# Patient Record
Sex: Female | Born: 1990 | Race: Black or African American | Hispanic: No | Smoking: Current every day smoker
Health system: Southern US, Community
[De-identification: ages and names within clinical notes are randomized; demographics above are authoritative.]

## PROBLEM LIST (undated history)

## (undated) ENCOUNTER — Inpatient Hospital Stay (HOSPITAL_COMMUNITY): Payer: Medicaid Other

## (undated) ENCOUNTER — Inpatient Hospital Stay (HOSPITAL_COMMUNITY): Payer: Self-pay

## (undated) DIAGNOSIS — A599 Trichomoniasis, unspecified: Secondary | ICD-10-CM

## (undated) DIAGNOSIS — B379 Candidiasis, unspecified: Secondary | ICD-10-CM

## (undated) DIAGNOSIS — A499 Bacterial infection, unspecified: Secondary | ICD-10-CM

## (undated) DIAGNOSIS — K589 Irritable bowel syndrome without diarrhea: Secondary | ICD-10-CM

## (undated) DIAGNOSIS — Z8619 Personal history of other infectious and parasitic diseases: Secondary | ICD-10-CM

## (undated) DIAGNOSIS — K5090 Crohn's disease, unspecified, without complications: Secondary | ICD-10-CM

## (undated) DIAGNOSIS — I959 Hypotension, unspecified: Secondary | ICD-10-CM

## (undated) DIAGNOSIS — E611 Iron deficiency: Secondary | ICD-10-CM

## (undated) HISTORY — DX: Bacterial infection, unspecified: A49.9

## (undated) HISTORY — DX: Candidiasis, unspecified: B37.9

## (undated) HISTORY — DX: Trichomoniasis, unspecified: A59.9

## (undated) HISTORY — DX: Iron deficiency: E61.1

## (undated) HISTORY — DX: Personal history of other infectious and parasitic diseases: Z86.19

## (undated) HISTORY — DX: Crohn's disease, unspecified, without complications: K50.90

---
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
## 2008-07-27 ENCOUNTER — Emergency Department (HOSPITAL_COMMUNITY): Admission: EM | Admit: 2008-07-27 | Discharge: 2008-07-27 | Payer: Self-pay | Admitting: Emergency Medicine

## 2008-09-25 ENCOUNTER — Emergency Department (HOSPITAL_COMMUNITY): Admission: EM | Admit: 2008-09-25 | Discharge: 2008-09-25 | Payer: Self-pay | Admitting: Emergency Medicine

## 2008-10-21 ENCOUNTER — Emergency Department (HOSPITAL_COMMUNITY): Admission: EM | Admit: 2008-10-21 | Discharge: 2008-10-22 | Payer: Self-pay | Admitting: Emergency Medicine

## 2008-11-26 ENCOUNTER — Inpatient Hospital Stay (HOSPITAL_COMMUNITY): Admission: AD | Admit: 2008-11-26 | Discharge: 2008-11-26 | Payer: Self-pay | Admitting: Obstetrics & Gynecology

## 2008-12-04 ENCOUNTER — Inpatient Hospital Stay (HOSPITAL_COMMUNITY): Admission: AD | Admit: 2008-12-04 | Discharge: 2008-12-04 | Payer: Self-pay | Admitting: Obstetrics

## 2009-01-24 ENCOUNTER — Ambulatory Visit (HOSPITAL_COMMUNITY): Admission: RE | Admit: 2009-01-24 | Discharge: 2009-01-24 | Payer: Self-pay | Admitting: Obstetrics

## 2009-05-05 ENCOUNTER — Inpatient Hospital Stay (HOSPITAL_COMMUNITY): Admission: AD | Admit: 2009-05-05 | Discharge: 2009-05-05 | Payer: Self-pay | Admitting: Obstetrics

## 2009-05-29 ENCOUNTER — Ambulatory Visit (HOSPITAL_COMMUNITY): Admission: RE | Admit: 2009-05-29 | Discharge: 2009-05-29 | Payer: Self-pay | Admitting: Obstetrics

## 2009-06-05 ENCOUNTER — Inpatient Hospital Stay (HOSPITAL_COMMUNITY): Admission: AD | Admit: 2009-06-05 | Discharge: 2009-06-05 | Payer: Self-pay | Admitting: Obstetrics

## 2009-06-08 ENCOUNTER — Inpatient Hospital Stay (HOSPITAL_COMMUNITY): Admission: AD | Admit: 2009-06-08 | Discharge: 2009-06-12 | Payer: Self-pay | Admitting: Obstetrics

## 2009-12-31 ENCOUNTER — Ambulatory Visit: Payer: Self-pay | Admitting: Nurse Practitioner

## 2009-12-31 ENCOUNTER — Inpatient Hospital Stay (HOSPITAL_COMMUNITY): Admission: AD | Admit: 2009-12-31 | Discharge: 2009-12-31 | Payer: Self-pay | Admitting: Obstetrics

## 2010-02-18 ENCOUNTER — Ambulatory Visit: Payer: Self-pay | Admitting: Nurse Practitioner

## 2010-02-18 ENCOUNTER — Inpatient Hospital Stay (HOSPITAL_COMMUNITY): Admission: AD | Admit: 2010-02-18 | Discharge: 2010-02-18 | Payer: Self-pay | Admitting: Obstetrics

## 2010-04-22 ENCOUNTER — Inpatient Hospital Stay (HOSPITAL_COMMUNITY): Admission: AD | Admit: 2010-04-22 | Discharge: 2010-04-22 | Payer: Self-pay | Admitting: Obstetrics

## 2010-04-22 ENCOUNTER — Ambulatory Visit: Payer: Self-pay | Admitting: Nurse Practitioner

## 2010-07-16 ENCOUNTER — Emergency Department (HOSPITAL_BASED_OUTPATIENT_CLINIC_OR_DEPARTMENT_OTHER)
Admission: EM | Admit: 2010-07-16 | Discharge: 2010-07-16 | Disposition: A | Discharge status: Home / Self Care | Payer: Medicaid Other | Attending: Emergency Medicine | Admitting: Emergency Medicine

## 2010-07-16 ENCOUNTER — Emergency Department (HOSPITAL_COMMUNITY)
Admission: EM | Admit: 2010-07-16 | Discharge: 2010-07-16 | Disposition: A | Discharge status: Home / Self Care | Payer: Medicaid Other | Attending: Emergency Medicine | Admitting: Emergency Medicine

## 2010-07-16 DIAGNOSIS — R109 Unspecified abdominal pain: Secondary | ICD-10-CM | POA: Insufficient documentation

## 2010-07-16 DIAGNOSIS — N390 Urinary tract infection, site not specified: Secondary | ICD-10-CM | POA: Insufficient documentation

## 2010-07-16 LAB — URINALYSIS, ROUTINE W REFLEX MICROSCOPIC
Bilirubin Urine: NEGATIVE
Bilirubin Urine: NEGATIVE
Ketones, ur: NEGATIVE mg/dL
Nitrite: NEGATIVE
Protein, ur: 30 mg/dL — AB
Specific Gravity, Urine: 1.016 (ref 1.005–1.030)
Specific Gravity, Urine: 1.016 (ref 1.005–1.030)
Urine Glucose, Fasting: NEGATIVE mg/dL
Urobilinogen, UA: 0.2 mg/dL (ref 0.0–1.0)
Urobilinogen, UA: 0.2 mg/dL (ref 0.0–1.0)

## 2010-07-16 LAB — PREGNANCY, URINE: Preg Test, Ur: NEGATIVE

## 2010-07-16 LAB — URINE MICROSCOPIC-ADD ON

## 2010-08-21 LAB — URINALYSIS, ROUTINE W REFLEX MICROSCOPIC
Glucose, UA: NEGATIVE mg/dL
Hgb urine dipstick: NEGATIVE
Specific Gravity, Urine: 1.01 (ref 1.005–1.030)
pH: 6 (ref 5.0–8.0)

## 2010-08-21 LAB — POCT PREGNANCY, URINE: Preg Test, Ur: NEGATIVE

## 2010-08-23 LAB — POCT PREGNANCY, URINE: Preg Test, Ur: NEGATIVE

## 2010-08-25 LAB — COMPREHENSIVE METABOLIC PANEL
Albumin: 3.9 g/dL (ref 3.5–5.2)
Alkaline Phosphatase: 110 U/L (ref 39–117)
BUN: 6 mg/dL (ref 6–23)
Calcium: 9.8 mg/dL (ref 8.4–10.5)
Glucose, Bld: 96 mg/dL (ref 70–99)
Potassium: 3.9 mEq/L (ref 3.5–5.1)
Sodium: 135 mEq/L (ref 135–145)
Total Protein: 8.2 g/dL (ref 6.0–8.3)

## 2010-08-25 LAB — CBC
MCHC: 32.8 g/dL (ref 30.0–36.0)
MCV: 80.7 fL (ref 78.0–100.0)
Platelets: 351 10*3/uL (ref 150–400)
RDW: 18 % — ABNORMAL HIGH (ref 11.5–15.5)
WBC: 7.5 10*3/uL (ref 4.0–10.5)

## 2010-08-25 LAB — POCT PREGNANCY, URINE: Preg Test, Ur: NEGATIVE

## 2010-08-25 LAB — URINALYSIS, ROUTINE W REFLEX MICROSCOPIC
Nitrite: NEGATIVE
Protein, ur: NEGATIVE mg/dL
Specific Gravity, Urine: <1.005 — ABNORMAL LOW (ref 1.005–1.030)
Urobilinogen, UA: 0.2 mg/dL (ref 0.0–1.0)

## 2010-08-26 LAB — CBC
HCT: 24.7 % — ABNORMAL LOW (ref 36.0–46.0)
MCHC: 32.9 g/dL (ref 30.0–36.0)
MCV: 85.1 fL (ref 78.0–100.0)
RBC: 2.9 MIL/uL — ABNORMAL LOW (ref 3.87–5.11)
WBC: 11.4 10*3/uL — ABNORMAL HIGH (ref 4.0–10.5)

## 2010-09-10 DIAGNOSIS — A599 Trichomoniasis, unspecified: Secondary | ICD-10-CM

## 2010-09-10 HISTORY — DX: Trichomoniasis, unspecified: A59.9

## 2010-09-10 LAB — COMPREHENSIVE METABOLIC PANEL
ALT: 20 U/L (ref 0–35)
BUN: 4 mg/dL — ABNORMAL LOW (ref 6–23)
CO2: 20 mEq/L (ref 19–32)
Calcium: 8.9 mg/dL (ref 8.4–10.5)
Creatinine, Ser: 0.87 mg/dL (ref 0.4–1.2)
GFR calc non Af Amer: >60 mL/min (ref >60)
Glucose, Bld: 91 mg/dL (ref 70–99)
Sodium: 132 mEq/L — ABNORMAL LOW (ref 135–145)
Total Protein: 6.5 g/dL (ref 6.0–8.3)

## 2010-09-10 LAB — CBC
HCT: 31.2 % — ABNORMAL LOW (ref 36.0–46.0)
MCHC: 33.5 g/dL (ref 30.0–36.0)
MCV: 83.7 fL (ref 78.0–100.0)
Platelets: 299 10*3/uL (ref 150–400)
RDW: 14.9 % (ref 11.5–15.5)
WBC: 8.9 10*3/uL (ref 4.0–10.5)

## 2010-09-10 LAB — URIC ACID: Uric Acid, Serum: 6.5 mg/dL (ref 2.4–7.0)

## 2010-09-10 LAB — LACTATE DEHYDROGENASE: LDH: 99 U/L (ref 94–250)

## 2010-09-10 LAB — RPR: RPR Ser Ql: NONREACTIVE

## 2010-09-12 LAB — URINALYSIS, ROUTINE W REFLEX MICROSCOPIC
Hgb urine dipstick: NEGATIVE
Nitrite: NEGATIVE
Protein, ur: NEGATIVE mg/dL
Specific Gravity, Urine: 1.02 (ref 1.005–1.030)
Urobilinogen, UA: 0.2 mg/dL (ref 0.0–1.0)

## 2010-09-12 LAB — CBC
MCHC: 33.7 g/dL (ref 30.0–36.0)
MCV: 86.2 fL (ref 78.0–100.0)
Platelets: 295 10*3/uL (ref 150–400)
WBC: 7.6 10*3/uL (ref 4.0–10.5)

## 2010-09-17 LAB — URINALYSIS, ROUTINE W REFLEX MICROSCOPIC
Bilirubin Urine: NEGATIVE
Hgb urine dipstick: NEGATIVE
Nitrite: NEGATIVE
Protein, ur: NEGATIVE mg/dL
Specific Gravity, Urine: 1.025 (ref 1.005–1.030)
Urobilinogen, UA: 0.2 mg/dL (ref 0.0–1.0)

## 2010-09-17 LAB — WET PREP, GENITAL

## 2010-09-18 LAB — POCT I-STAT, CHEM 8
Calcium, Ion: 1.16 mmol/L (ref 1.12–1.32)
Chloride: 107 mEq/L (ref 96–112)
Glucose, Bld: 83 mg/dL (ref 70–99)
HCT: 35 % — ABNORMAL LOW (ref 36.0–46.0)
TCO2: 20 mmol/L (ref 0–100)

## 2010-09-18 LAB — URINALYSIS, ROUTINE W REFLEX MICROSCOPIC
Ketones, ur: NEGATIVE mg/dL
Nitrite: NEGATIVE
Protein, ur: NEGATIVE mg/dL
Urobilinogen, UA: 0.2 mg/dL (ref 0.0–1.0)

## 2010-09-18 LAB — DIFFERENTIAL
Basophils Absolute: 0 10*3/uL (ref 0.0–0.1)
Basophils Relative: 0 % (ref 0–1)
Eosinophils Absolute: 0.1 10*3/uL (ref 0.0–0.7)
Eosinophils Relative: 2 % (ref 0–5)
Lymphocytes Relative: 33 % (ref 12–46)
Monocytes Absolute: 0.4 10*3/uL (ref 0.1–1.0)

## 2010-09-18 LAB — ABO/RH: ABO/RH(D): O POS

## 2010-09-18 LAB — CBC
HCT: 35.5 % — ABNORMAL LOW (ref 36.0–46.0)
Hemoglobin: 11.9 g/dL — ABNORMAL LOW (ref 12.0–15.0)
MCHC: 33.5 g/dL (ref 30.0–36.0)
MCV: 86.9 fL (ref 78.0–100.0)
RDW: 15.3 % (ref 11.5–15.5)

## 2010-09-18 LAB — WET PREP, GENITAL
Trich, Wet Prep: NONE SEEN
WBC, Wet Prep HPF POC: NONE SEEN
Yeast Wet Prep HPF POC: NONE SEEN
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
## 2011-03-07 ENCOUNTER — Encounter (HOSPITAL_COMMUNITY): Payer: Self-pay

## 2011-03-07 ENCOUNTER — Inpatient Hospital Stay (HOSPITAL_COMMUNITY)
Admission: AD | Admit: 2011-03-07 | Discharge: 2011-03-07 | Disposition: A | Discharge status: Home / Self Care | Payer: Medicaid Other | Source: Ambulatory Visit | Attending: Obstetrics and Gynecology | Admitting: Obstetrics and Gynecology

## 2011-03-07 DIAGNOSIS — K589 Irritable bowel syndrome without diarrhea: Secondary | ICD-10-CM

## 2011-03-07 DIAGNOSIS — R197 Diarrhea, unspecified: Secondary | ICD-10-CM

## 2011-03-07 DIAGNOSIS — N390 Urinary tract infection, site not specified: Secondary | ICD-10-CM | POA: Insufficient documentation

## 2011-03-07 HISTORY — DX: Irritable bowel syndrome, unspecified: K58.9

## 2011-03-07 LAB — COMPREHENSIVE METABOLIC PANEL
Albumin: 3.3 g/dL — ABNORMAL LOW (ref 3.5–5.2)
Alkaline Phosphatase: 119 U/L — ABNORMAL HIGH (ref 39–117)
BUN: 5 mg/dL — ABNORMAL LOW (ref 6–23)
Creatinine, Ser: 0.74 mg/dL (ref 0.50–1.10)
Potassium: 3.4 mEq/L — ABNORMAL LOW (ref 3.5–5.1)
Total Protein: 7.5 g/dL (ref 6.0–8.3)

## 2011-03-07 LAB — CBC
HCT: 30.9 % — ABNORMAL LOW (ref 36.0–46.0)
MCHC: 32 g/dL (ref 30.0–36.0)
RDW: 15.5 % (ref 11.5–15.5)

## 2011-03-07 MED ORDER — ONDANSETRON HCL 4 MG/2ML IJ SOLN
4.0000 mg | Freq: Once | INTRAMUSCULAR | Status: AC
  Administered 2011-03-07: 4 mg via INTRAVENOUS
  Filled 2011-03-07: qty 2

## 2011-03-07 MED ORDER — SODIUM CHLORIDE 0.9 % IV SOLN
Freq: Once | INTRAVENOUS | Status: AC
  Administered 2011-03-07: 18:00:00 via INTRAVENOUS

## 2011-03-07 MED ORDER — CIPROFLOXACIN HCL 500 MG PO TABS: 500.0000 mg | ORAL_TABLET | Freq: Two times a day (BID) | ORAL | Status: AC

## 2011-03-07 MED ORDER — ESOMEPRAZOLE MAGNESIUM 20 MG PO CPDR: 20.0000 mg | DELAYED_RELEASE_CAPSULE | Freq: Every day | ORAL | Status: DC

## 2011-03-07 NOTE — ED Provider Notes (Signed)
History     CSN: [id number] Arrival date & time: 03/07/2011  4:48 PM  No chief complaint on file.  HPI Sharon Clayton is a 20 y.o. female who presents to [facility]U for diarrhea, nausea and feeling weak. States she was diagnosed with IBS years ago and always took nexium. Has not had a normal BM since son was born 2 years ago. Has not been back to GI so is no longer on medication. Last night was different from usual with nausea and more episodes of diarrhea and feeling dehydrated and palpations. Today no palpations but feeling tired.   Past Medical History  Diagnosis Date  . IBS (irritable bowel syndrome)     Past Surgical History  Procedure Date  . Cesarean section     No family history on file.  History  Substance Use Topics  . Smoking status: Not on file  . Smokeless tobacco: Not on file  . Alcohol Use:     OB History    Grav Para Term Preterm Abortions TAB SAB Ect Mult Living   1 1 1  0 0 0 0 0 0 1      Review of Systems  Eyes: Negative.   Respiratory: Negative.   Cardiovascular: Positive for palpitations.  Gastrointestinal: Positive for nausea, abdominal pain and diarrhea.  Musculoskeletal: Negative.   Skin: Negative.     Allergies  Review of patient's allergies indicates no known allergies.  Home Medications  No current outpatient prescriptions on file.  BP 142/85  Pulse 82  Temp(Src) 99 F (37.2 C) (Oral)  Resp 20  Ht 5' 1"  (1.549 m)  Wt 184 lb 3.2 oz (83.553 kg)  BMI 34.80 kg/m2  SpO2 100%  LMP 03/05/2011  Physical Exam  Nursing note and vitals reviewed. Constitutional: She is oriented to person, place, and time. No distress.       obese  HENT:  Head: Normocephalic.  Eyes: EOM are normal.  Neck: Neck supple.  Cardiovascular: Normal rate and regular rhythm.   Pulmonary/Chest: Effort normal and breath sounds normal.  Abdominal: Soft. Bowel sounds are normal. There is tenderness in the left lower quadrant.       Mildly tender lower left abdomen.    Musculoskeletal: Normal range of motion.  Neurological: She is alert and oriented to person, place, and time. No cranial nerve deficit.  Skin: Skin is warm and dry.    ED Course: IV hydration and antimetics.   Procedures  MDM: Patient feeling much better after IV hydration and antimeditics  And states she is ready to go home.     Assessment: Diarrhea   UTI  Plan:  Patient to follow up with GI for her IBS.   B.r.a.t. Diet   Cipro 500 mg. Po bid x 5 days   [facility], NP 03/07/11 [address] [place], [place] 03/07/11 [phone]

## 2011-03-07 NOTE — Progress Notes (Signed)
Pt states she has had diarrhea every day for about one year, was diagnosed with IBS. Last night she states she had multiple episodes of diarrhea and felt dizzy and felt heart racing and palpitations and nausea. Continues today to feel nausea and periods of dizziness. No pain.

## 2011-03-08 NOTE — ED Provider Notes (Signed)
Agree with above note.  Sharon Clayton 03/08/2011 7:48 AM

## 2011-03-28 ENCOUNTER — Emergency Department (HOSPITAL_COMMUNITY)
Admission: EM | Admit: 2011-03-28 | Discharge: 2011-03-28 | Disposition: A | Discharge status: Home / Self Care | Payer: Medicaid Other | Attending: Emergency Medicine | Admitting: Emergency Medicine

## 2011-03-28 DIAGNOSIS — R42 Dizziness and giddiness: Secondary | ICD-10-CM | POA: Insufficient documentation

## 2011-03-28 DIAGNOSIS — R197 Diarrhea, unspecified: Secondary | ICD-10-CM | POA: Insufficient documentation

## 2011-03-28 LAB — POCT I-STAT, CHEM 8
BUN: 9 mg/dL (ref 6–23)
Calcium, Ion: 1.26 mmol/L (ref 1.12–1.32)
Chloride: 106 meq/L (ref 96–112)
Creatinine, Ser: 0.9 mg/dL (ref 0.50–1.10)
Glucose, Bld: 76 mg/dL (ref 70–99)
HCT: 37 % (ref 36.0–46.0)
Hemoglobin: 12.6 g/dL (ref 12.0–15.0)
Potassium: 3.7 mEq/L (ref 3.5–5.1)
Sodium: 141 mEq/L (ref 135–145)
TCO2: 23 mmol/L (ref 0–100)

## 2011-03-28 LAB — URINALYSIS, ROUTINE W REFLEX MICROSCOPIC
Glucose, UA: NEGATIVE mg/dL
Leukocytes, UA: NEGATIVE
Specific Gravity, Urine: 1.014 (ref 1.005–1.030)
pH: 7 (ref 5.0–8.0)

## 2011-03-28 LAB — POCT PREGNANCY, URINE: Preg Test, Ur: NEGATIVE

## 2011-09-10 ENCOUNTER — Encounter: Payer: Self-pay | Admitting: Internal Medicine

## 2011-09-10 DIAGNOSIS — Z8619 Personal history of other infectious and parasitic diseases: Secondary | ICD-10-CM

## 2011-09-10 DIAGNOSIS — A499 Bacterial infection, unspecified: Secondary | ICD-10-CM

## 2011-09-10 DIAGNOSIS — B379 Candidiasis, unspecified: Secondary | ICD-10-CM

## 2011-09-10 DIAGNOSIS — E611 Iron deficiency: Secondary | ICD-10-CM

## 2011-09-10 HISTORY — DX: Candidiasis, unspecified: B37.9

## 2011-09-10 HISTORY — DX: Personal history of other infectious and parasitic diseases: Z86.19

## 2011-09-10 HISTORY — DX: Iron deficiency: E61.1

## 2011-09-10 HISTORY — DX: Bacterial infection, unspecified: A49.9

## 2011-09-11 DIAGNOSIS — N920 Excessive and frequent menstruation with regular cycle: Secondary | ICD-10-CM | POA: Insufficient documentation

## 2011-09-12 ENCOUNTER — Encounter (INDEPENDENT_AMBULATORY_CARE_PROVIDER_SITE_OTHER): Payer: Medicaid Other | Admitting: Obstetrics and Gynecology

## 2011-09-12 DIAGNOSIS — N926 Irregular menstruation, unspecified: Secondary | ICD-10-CM

## 2011-09-12 DIAGNOSIS — Z0000 Encounter for general adult medical examination without abnormal findings: Secondary | ICD-10-CM

## 2011-09-12 DIAGNOSIS — Z202 Contact with and (suspected) exposure to infections with a predominantly sexual mode of transmission: Secondary | ICD-10-CM

## 2011-09-20 ENCOUNTER — Ambulatory Visit: Payer: Medicaid Other | Admitting: Internal Medicine

## 2011-09-27 ENCOUNTER — Telehealth: Payer: Self-pay

## 2011-09-27 MED ORDER — METRONIDAZOLE 0.75 % VA GEL: 1.0000 | Freq: Every day | VAGINAL | Status: DC

## 2011-09-27 NOTE — Telephone Encounter (Signed)
Spoke with pt rgd labs informed labs and that pap showed bv needs treatment pt wants rx called to [facility] advised pt rx called in pt voice understanding.

## 2011-09-30 ENCOUNTER — Other Ambulatory Visit: Payer: Self-pay

## 2011-09-30 DIAGNOSIS — N926 Irregular menstruation, unspecified: Secondary | ICD-10-CM

## 2011-10-07 DIAGNOSIS — N920 Excessive and frequent menstruation with regular cycle: Secondary | ICD-10-CM

## 2011-10-08 ENCOUNTER — Encounter: Payer: Medicaid Other | Admitting: Obstetrics and Gynecology

## 2011-10-08 ENCOUNTER — Other Ambulatory Visit: Payer: Medicaid Other

## 2011-11-13 ENCOUNTER — Telehealth: Payer: Self-pay | Admitting: Obstetrics and Gynecology

## 2011-11-13 NOTE — Telephone Encounter (Signed)
ND PT

## 2011-11-13 NOTE — Telephone Encounter (Signed)
Triage/cht received

## 2011-11-15 NOTE — Telephone Encounter (Signed)
Spoke with pt rgd msg pt wants to r/s u/s and f/u from 4/30 pt has appt 12/13/11 at 1030 for u/s the f/u with ND pt voice understanding

## 2011-12-13 ENCOUNTER — Ambulatory Visit (INDEPENDENT_AMBULATORY_CARE_PROVIDER_SITE_OTHER): Payer: Medicaid Other | Admitting: Obstetrics and Gynecology

## 2011-12-13 ENCOUNTER — Ambulatory Visit (INDEPENDENT_AMBULATORY_CARE_PROVIDER_SITE_OTHER): Payer: Medicaid Other

## 2011-12-13 ENCOUNTER — Encounter: Payer: Self-pay | Admitting: Obstetrics and Gynecology

## 2011-12-13 VITALS — BP 108/60 | Temp 98.1°F | Resp 16 | Ht 61.5 in | Wt 161.0 lb

## 2011-12-13 DIAGNOSIS — N926 Irregular menstruation, unspecified: Secondary | ICD-10-CM

## 2011-12-13 DIAGNOSIS — N920 Excessive and frequent menstruation with regular cycle: Secondary | ICD-10-CM

## 2011-12-13 MED ORDER — IBUPROFEN 600 MG PO TABS: 600.0000 mg | ORAL_TABLET | Freq: Four times a day (QID) | ORAL | Status: AC | PRN

## 2011-12-13 NOTE — Progress Notes (Signed)
F/U U/S for menorrhagia Desires blood work for anemia Pt states her periods are still long.  She has occasional cramping BP 108/60  Temp 98.1 F (36.7 C)  Resp 16  Ht 5' 1.5" (1.562 m)  Wt 161 lb (73.029 kg)  BMI 29.93 kg/m2  LMP 12/08/2011 Physical Examination: General appearance - alert, well appearing, and in no distress Heart - normal rate, regular rhythm, normal S1, S2, no murmurs, rubs, clicks or gallops Abdomen - soft, nontender, nondistended, no masses or organomegaly Extremities - peripheral pulses normal, no pedal edema, no clubbing or cyanosis [place] 6.56 by 4.22 cm normal appearing ovaries Results for orders placed during the hospital encounter of 03/28/11  POCT I-STAT, CHEM 8      Component Value Range   Sodium 141  135 - 145 mEq/L   Potassium 3.7  3.5 - 5.1 mEq/L   Chloride 106  96 - 112 mEq/L   BUN 9  6 - 23 mg/dL   Creatinine, Ser 0.90  0.50 - 1.10 mg/dL   Glucose, Bld 76  70 - 99 mg/dL   Calcium, Ion 1.26  1.12 - 1.32 mmol/L   TCO2 23  0 - 100 mmol/L   Hemoglobin 12.6  12.0 - 15.0 g/dL   HCT 37.0  36.0 - 46.0 %  POCT PREGNANCY, URINE      Component Value Range   Preg Test, Ur NEGATIVE    URINALYSIS, ROUTINE W REFLEX MICROSCOPIC      Component Value Range   Color, Urine YELLOW  YELLOW   APPearance CLOUDY (*) CLEAR   Specific Gravity, Urine 1.014  1.005 - 1.030   pH 7.0  5.0 - 8.0   Glucose, UA NEGATIVE  NEGATIVE mg/dL   Hgb urine dipstick NEGATIVE  NEGATIVE   Bilirubin Urine NEGATIVE  NEGATIVE   Ketones, ur NEGATIVE  NEGATIVE mg/dL   Protein, ur NEGATIVE  NEGATIVE mg/dL   Urobilinogen, UA 0.2  0.0 - 1.0 mg/dL   Nitrite NEGATIVE  NEGATIVE   Leukocytes, UA NEGATIVE  NEGATIVE    HGB 11.7 TSH WNL Menorrhagia US WNL All treatments reviewed with the pt.  She desires Motrin.  F/u 3-4 months for AEX

## 2011-12-19 ENCOUNTER — Other Ambulatory Visit: Payer: Self-pay | Admitting: Obstetrics and Gynecology

## 2011-12-19 DIAGNOSIS — N926 Irregular menstruation, unspecified: Secondary | ICD-10-CM

## 2011-12-20 ENCOUNTER — Telehealth: Payer: Self-pay | Admitting: Internal Medicine

## 2011-12-20 NOTE — Telephone Encounter (Signed)
Received records from Dr. Lorie Apley office. Dr. Olevia Perches reviewed the records and declined to accept pt at this time. Notified pt and she said she would pickup her medical records.

## 2012-06-17 NOTE — Telephone Encounter (Signed)
Patient never came to pick up records from Dr Collene Mares. Records have been destroyed as of today.

## 2012-08-21 ENCOUNTER — Encounter (HOSPITAL_COMMUNITY): Payer: Self-pay | Admitting: Emergency Medicine

## 2012-08-21 ENCOUNTER — Emergency Department (HOSPITAL_COMMUNITY)
Admission: EM | Admit: 2012-08-21 | Discharge: 2012-08-21 | Disposition: A | Discharge status: Home / Self Care | Payer: Self-pay | Attending: Emergency Medicine | Admitting: Emergency Medicine

## 2012-08-21 DIAGNOSIS — Z79899 Other long term (current) drug therapy: Secondary | ICD-10-CM | POA: Insufficient documentation

## 2012-08-21 DIAGNOSIS — Z8719 Personal history of other diseases of the digestive system: Secondary | ICD-10-CM | POA: Insufficient documentation

## 2012-08-21 DIAGNOSIS — Z8619 Personal history of other infectious and parasitic diseases: Secondary | ICD-10-CM | POA: Insufficient documentation

## 2012-08-21 DIAGNOSIS — IMO0002 Reserved for concepts with insufficient information to code with codable children: Secondary | ICD-10-CM | POA: Insufficient documentation

## 2012-08-21 DIAGNOSIS — F17200 Nicotine dependence, unspecified, uncomplicated: Secondary | ICD-10-CM | POA: Insufficient documentation

## 2012-08-21 MED ORDER — HYDROCODONE-ACETAMINOPHEN 5-325 MG PO TABS: 2.0000 | ORAL_TABLET | ORAL | Status: DC | PRN

## 2012-08-21 MED ORDER — NAPROXEN 500 MG PO TABS: 500.0000 mg | ORAL_TABLET | Freq: Two times a day (BID) | ORAL | Status: DC

## 2012-08-21 MED ORDER — METHOCARBAMOL 500 MG PO TABS: 500.0000 mg | ORAL_TABLET | Freq: Two times a day (BID) | ORAL | Status: DC

## 2012-08-21 NOTE — ED Provider Notes (Signed)
  Medical screening examination/treatment/procedure(s) were performed by non-physician practitioner and as supervising physician I was immediately available for consultation/collaboration.    [name], MD 08/21/12 [phone]
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
## 2012-12-03 ENCOUNTER — Emergency Department (HOSPITAL_COMMUNITY)
Admission: EM | Admit: 2012-12-03 | Discharge: 2012-12-04 | Disposition: A | Discharge status: Home / Self Care | Payer: Self-pay | Attending: Emergency Medicine | Admitting: Emergency Medicine

## 2012-12-03 ENCOUNTER — Encounter (HOSPITAL_COMMUNITY): Payer: Self-pay | Admitting: Emergency Medicine

## 2012-12-03 DIAGNOSIS — Z79899 Other long term (current) drug therapy: Secondary | ICD-10-CM | POA: Insufficient documentation

## 2012-12-03 DIAGNOSIS — F17200 Nicotine dependence, unspecified, uncomplicated: Secondary | ICD-10-CM | POA: Insufficient documentation

## 2012-12-03 DIAGNOSIS — N949 Unspecified condition associated with female genital organs and menstrual cycle: Secondary | ICD-10-CM | POA: Insufficient documentation

## 2012-12-03 DIAGNOSIS — Z8619 Personal history of other infectious and parasitic diseases: Secondary | ICD-10-CM | POA: Insufficient documentation

## 2012-12-03 DIAGNOSIS — Z3202 Encounter for pregnancy test, result negative: Secondary | ICD-10-CM | POA: Insufficient documentation

## 2012-12-03 DIAGNOSIS — Z8719 Personal history of other diseases of the digestive system: Secondary | ICD-10-CM | POA: Insufficient documentation

## 2012-12-03 DIAGNOSIS — R10819 Abdominal tenderness, unspecified site: Secondary | ICD-10-CM

## 2012-12-03 DIAGNOSIS — R109 Unspecified abdominal pain: Secondary | ICD-10-CM | POA: Insufficient documentation

## 2012-12-03 DIAGNOSIS — R350 Frequency of micturition: Secondary | ICD-10-CM | POA: Insufficient documentation

## 2012-12-03 NOTE — ED Notes (Signed)
Pt states she started having abd pain two days ago and has been voiding more than normal  Pt states she took 2 pregnancy tests two days ago and both were positive  States she repeated the test today and both came back negative  Pt states the pain is in her pelvic region and is not bad but just bothersome

## 2012-12-04 LAB — WET PREP, GENITAL
Trich, Wet Prep: NONE SEEN
WBC, Wet Prep HPF POC: NONE SEEN
Yeast Wet Prep HPF POC: NONE SEEN

## 2012-12-04 LAB — URINALYSIS, ROUTINE W REFLEX MICROSCOPIC
Bilirubin Urine: NEGATIVE
Glucose, UA: NEGATIVE mg/dL
Ketones, ur: NEGATIVE mg/dL
Leukocytes, UA: NEGATIVE
Specific Gravity, Urine: 1.009 (ref 1.005–1.030)
pH: 6.5 (ref 5.0–8.0)

## 2012-12-04 LAB — URINE MICROSCOPIC-ADD ON

## 2012-12-04 MED ORDER — IBUPROFEN 800 MG PO TABS
800.0000 mg | ORAL_TABLET | Freq: Once | ORAL | Status: AC
  Administered 2012-12-04: 800 mg via ORAL
  Filled 2012-12-04: qty 1

## 2012-12-04 MED ORDER — CEPHALEXIN 500 MG PO CAPS: 500.0000 mg | ORAL_CAPSULE | Freq: Two times a day (BID) | ORAL | Status: DC

## 2012-12-04 NOTE — ED Provider Notes (Signed)
History    CSN: [id number] Arrival date & time 12/03/12  2249  First MD Initiated Contact with Patient 12/04/12 0053     Chief Complaint  Patient presents with  . Abdominal Pain   (Consider location/radiation/quality/duration/timing/severity/associated sxs/prior Treatment) HPI Comments: Patient is a 22 year old female who presents for lower abdominal and pelvic discomfort x2 days which she describes as a cramping sensation. Patient states that symptoms have been intermittent since onset without aggravating or alleviating factors. Patient denies taking anything for the discomfort. She admits to associated urinary frequency and denies recent fevers, chest pain, shortness of breath, nausea or vomiting, diarrhea, vaginal bleeding or discharge, dysuria or hematuria, and numbness or tingling in her extremities. Patient is due for her next menses in 6 days. She states that she took 2 pregnancy tests at home yesterday which were positive; patient took another 2 pregnancy test today prior to arrival which were negative.  Patient is a 22 y.o. female presenting with abdominal pain. The history is provided by the patient. No language interpreter was used.  Abdominal Pain Associated symptoms include abdominal pain. Pertinent negatives include no chest pain, fever, nausea or vomiting.   Past Medical History  Diagnosis Date  . IBS (irritable bowel syndrome)   . H/O varicella 09/10/11  . Low iron 09/10/11  . Yeast infection 09/10/11  . Bacterial infection 09/10/11  . Trichomonas 09/10/10   Past Surgical History  Procedure Laterality Date  . Cesarean section  09/10/11   Family History  Problem Relation Age of Onset  . Hypertension Mother    History  Substance Use Topics  . Smoking status: Current Every Day Smoker -- 3.00 packs/day for 8 years    Types: Cigarettes  . Smokeless tobacco: Never Used  . Alcohol Use: No   OB History   Grav Para Term Preterm Abortions TAB SAB Ect Mult Living   1 1 1  0 0 0  0 0 0 1     Review of Systems  Constitutional: Negative for fever.  Respiratory: Negative for shortness of breath.   Cardiovascular: Negative for chest pain.  Gastrointestinal: Positive for abdominal pain. Negative for nausea, vomiting, diarrhea and blood in stool.  Genitourinary: Positive for frequency and pelvic pain. Negative for dysuria, hematuria, vaginal bleeding, vaginal discharge and vaginal pain.       No dyspareunia  All other systems reviewed and are negative.    Allergies  Review of patient's allergies indicates no known allergies.  Home Medications   Current Outpatient Rx  Name  Route  Sig  Dispense  Refill  . prenatal vitamin w/FE, FA (PRENATAL 1 + 1) 27-1 MG TABS   Oral   Take 1 tablet by mouth daily.           Marland Kitchen saccharomyces boulardii (FLORASTOR) 250 MG capsule   Oral   Take 250 mg by mouth 2 (two) times daily.         . cephALEXin (KEFLEX) 500 MG capsule   Oral   Take 1 capsule (500 mg total) by mouth 2 (two) times daily.   20 capsule   0    BP 91/63  Pulse 83  Temp(Src) 98.8 F (37.1 C) (Oral)  Resp 18  SpO2 98%  LMP 11/12/2012  Physical Exam  Nursing note and vitals reviewed. Constitutional: She is oriented to person, place, and time. She appears well-developed and well-nourished. No distress.  HENT:  Head: Normocephalic and atraumatic.  Eyes: Conjunctivae and EOM are normal. No scleral icterus.  Neck: Normal range of motion.  Cardiovascular: Normal rate, regular rhythm and intact distal pulses.   Pulmonary/Chest: Effort normal. No respiratory distress.  Abdominal: Soft. She exhibits no distension and no mass. There is tenderness (suprapubic). There is no rebound and no guarding.  Genitourinary: Vagina normal and uterus normal. There is no rash, tenderness, lesion or injury on the right labia. There is no rash, tenderness, lesion or injury on the left labia. Cervix exhibits no motion tenderness, no discharge and no friability. Right  adnexum displays no mass, no tenderness and no fullness. Left adnexum displays no mass, no tenderness and no fullness.  Musculoskeletal: Normal range of motion. She exhibits no edema.  Neurological: She is alert and oriented to person, place, and time.  Skin: Skin is warm and dry. No rash noted. She is not diaphoretic. No erythema. No pallor.  Psychiatric: She has a normal mood and affect. Her behavior is normal.    ED Course  Procedures (including critical care time) Labs Reviewed  WET PREP, GENITAL - Abnormal; Notable for the following:    Clue Cells Wet Prep HPF POC RARE (*)    All other components within normal limits  URINALYSIS, ROUTINE W REFLEX MICROSCOPIC - Abnormal; Notable for the following:    Nitrite POSITIVE (*)    All other components within normal limits  URINE MICROSCOPIC-ADD ON - Abnormal; Notable for the following:    Bacteria, UA FEW (*)    All other components within normal limits  GC/CHLAMYDIA PROBE AMP  URINE CULTURE  PREGNANCY, URINE   No results found.   1. Urinary frequency   2. Suprapubic tenderness     MDM  Cramping abdominal pain in suprapubic region with urinary frequency. Patient denies fevers or vaginal c/o. Physical exam as above and chaperoned GU exam without significant findings. Urine pregnancy today negative. UA nitrite positive. Symptoms most c/w cystitis given UA, despite lack of bacteria and WBC. Patient afebrile, well appearing, and nontoxic with no worsening abdominal TTP on reexamination; patient declines pain medicine in ED. Patient appropriate for d/c with Keflex for symptoms and PCP follow up. Indications for ED return discussed with patient who verbalizes comfort and understanding with plan.  Antonietta Breach, PA-C 12/09/12 [phone]

## 2012-12-05 LAB — GC/CHLAMYDIA PROBE AMP: CT Probe RNA: NEGATIVE

## 2012-12-06 LAB — URINE CULTURE

## 2012-12-07 NOTE — ED Notes (Signed)
Post ED Visit - Positive Culture Follow-up  Culture report reviewed by antimicrobial stewardship pharmacist: []  Wes Dulaney, Pharm.D., BCPS [x]  Heide Guile, Pharm.D., BCPS []  Alycia Rossetti, Pharm.D., BCPS []  [place], [place].D., BCPS, AAHIVP []  Legrand Como, Pharm.D., BCPS, AAHIVP  Positive urine culture Treated with cephelexin, organism sensitive to the same and no further patient follow-up is required at this time.  Varney Baas 12/07/2012, 4:52 PM

## 2012-12-09 NOTE — ED Provider Notes (Signed)
Medical screening examination/treatment/procedure(s) were performed by non-physician practitioner and as supervising physician I was immediately available for consultation/collaboration.  Threasa Beards, MD 12/09/12 [phone]

## 2013-01-14 ENCOUNTER — Encounter (HOSPITAL_COMMUNITY): Payer: Self-pay | Admitting: *Deleted

## 2013-01-14 ENCOUNTER — Emergency Department (HOSPITAL_COMMUNITY)
Admission: EM | Admit: 2013-01-14 | Discharge: 2013-01-15 | Disposition: A | Discharge status: Home / Self Care | Payer: Medicaid Other | Attending: Emergency Medicine | Admitting: Emergency Medicine

## 2013-01-14 DIAGNOSIS — Z3490 Encounter for supervision of normal pregnancy, unspecified, unspecified trimester: Secondary | ICD-10-CM

## 2013-01-14 DIAGNOSIS — O418X10 Other specified disorders of amniotic fluid and membranes, first trimester, not applicable or unspecified: Secondary | ICD-10-CM

## 2013-01-14 DIAGNOSIS — Z8619 Personal history of other infectious and parasitic diseases: Secondary | ICD-10-CM | POA: Insufficient documentation

## 2013-01-14 DIAGNOSIS — K589 Irritable bowel syndrome without diarrhea: Secondary | ICD-10-CM | POA: Insufficient documentation

## 2013-01-14 DIAGNOSIS — D509 Iron deficiency anemia, unspecified: Secondary | ICD-10-CM | POA: Insufficient documentation

## 2013-01-14 DIAGNOSIS — O368990 Maternal care for other specified fetal problems, unspecified trimester, not applicable or unspecified: Secondary | ICD-10-CM | POA: Insufficient documentation

## 2013-01-14 DIAGNOSIS — R1032 Left lower quadrant pain: Secondary | ICD-10-CM | POA: Insufficient documentation

## 2013-01-14 DIAGNOSIS — O99330 Smoking (tobacco) complicating pregnancy, unspecified trimester: Secondary | ICD-10-CM | POA: Insufficient documentation

## 2013-01-14 DIAGNOSIS — Z3201 Encounter for pregnancy test, result positive: Secondary | ICD-10-CM | POA: Insufficient documentation

## 2013-01-14 NOTE — ED Notes (Signed)
Pt states that she has been having left lower quad pain x 2 days; pt reports that she is [redacted] weeks pregnant; pt denies bleeding or vaginal discharge; pt states the pain is intermittent but consistent

## 2013-01-15 ENCOUNTER — Emergency Department (HOSPITAL_COMMUNITY): Payer: Medicaid Other

## 2013-01-15 LAB — CBC WITH DIFFERENTIAL/PLATELET
Basophils Absolute: 0 10*3/uL (ref 0.0–0.1)
Basophils Relative: 0 % (ref 0–1)
Eosinophils Absolute: 0.1 10*3/uL (ref 0.0–0.7)
Hemoglobin: 11.1 g/dL — ABNORMAL LOW (ref 12.0–15.0)
MCH: 26.7 pg (ref 26.0–34.0)
MCHC: 33.5 g/dL (ref 30.0–36.0)
Monocytes Relative: 6 % (ref 3–12)
Neutro Abs: 5.3 10*3/uL (ref 1.7–7.7)
Neutrophils Relative %: 63 % (ref 43–77)
Platelets: 306 10*3/uL (ref 150–400)

## 2013-01-15 LAB — GC/CHLAMYDIA PROBE AMP: GC Probe RNA: NEGATIVE

## 2013-01-15 LAB — WET PREP, GENITAL
WBC, Wet Prep HPF POC: NONE SEEN
Yeast Wet Prep HPF POC: NONE SEEN

## 2013-01-15 LAB — URINALYSIS, ROUTINE W REFLEX MICROSCOPIC
Ketones, ur: NEGATIVE mg/dL
Leukocytes, UA: NEGATIVE
Nitrite: NEGATIVE
Protein, ur: NEGATIVE mg/dL
Urobilinogen, UA: 0.2 mg/dL (ref 0.0–1.0)

## 2013-01-15 LAB — HCG, QUANTITATIVE, PREGNANCY: hCG, Beta Chain, Quant, S: 12524 m[IU]/mL — ABNORMAL HIGH (ref <5)

## 2013-01-15 NOTE — ED Provider Notes (Signed)
CSN: [id number]     Arrival date & time 01/14/13  2304 History     First MD Initiated Contact with Patient 01/15/13 0008     Chief Complaint  Patient presents with  . Abdominal Pain   (Consider location/radiation/quality/duration/timing/severity/associated sxs/prior Treatment) HPI 22 yo female presents to the ER from home with complaint of LLQ pain for the last 2 days.  Pt reports positive pregnancy test 7-10 days ago.  She has not yet seen OB.  She is concerned for tubal pregnancy.  No prior h/o same.  G2P1, no problems with prior pregnancy.  Denies h/o PID, has had trich.  No fevers, chills.  No n/v.  Increase urination but no pain.  Normal loose bowels-h/o IBS.  Past Medical History  Diagnosis Date  . IBS (irritable bowel syndrome)   . H/O varicella 09/10/11  . Low iron 09/10/11  . Yeast infection 09/10/11  . Bacterial infection 09/10/11  . Trichomonas 09/10/10   Past Surgical History  Procedure Laterality Date  . Cesarean section  09/10/11   Family History  Problem Relation Age of Onset  . Hypertension Mother    History  Substance Use Topics  . Smoking status: Current Every Day Smoker -- 3.00 packs/day for 8 years    Types: Cigarettes  . Smokeless tobacco: Never Used  . Alcohol Use: No   OB History   Grav Para Term Preterm Abortions TAB SAB Ect Mult Living   1 1 1  0 0 0 0 0 0 1     Review of Systems  All other systems reviewed and are negative.    Allergies  Lactose intolerance (gi)  Home Medications   Current Outpatient Rx  Name  Route  Sig  Dispense  Refill  . prenatal vitamin w/FE, FA (PRENATAL 1 + 1) 27-1 MG TABS   Oral   Take 1 tablet by mouth daily.           Marland Kitchen saccharomyces boulardii (FLORASTOR) 250 MG capsule   Oral   Take 250 mg by mouth 2 (two) times daily.          BP 118/58  Pulse 82  Temp(Src) 99 F (37.2 C) (Oral)  Resp 16  Ht 5' 1"  (1.549 m)  Wt 145 lb (65.772 kg)  BMI 27.41 kg/m2  SpO2 100%  LMP 12/07/2012 Physical Exam  Nursing  note and vitals reviewed. Constitutional: She is oriented to person, place, and time. She appears well-developed and well-nourished.  HENT:  Head: Normocephalic and atraumatic.  Right Ear: External ear normal.  Left Ear: External ear normal.  Nose: Nose normal.  Mouth/Throat: Oropharynx is clear and moist.  Eyes: Conjunctivae and EOM are normal. Pupils are equal, round, and reactive to light.  Neck: Normal range of motion. Neck supple. No JVD present. No tracheal deviation present. No thyromegaly present.  Cardiovascular: Normal rate, regular rhythm, normal heart sounds and intact distal pulses.  Exam reveals no gallop and no friction rub.   No murmur heard. Pulmonary/Chest: Effort normal and breath sounds normal. No stridor. No respiratory distress. She has no wheezes. She has no rales. She exhibits no tenderness.  Abdominal: Soft. Bowel sounds are normal. She exhibits no distension and no mass. There is tenderness (mild LLQ pain to palpation). There is no rebound and no guarding.  Genitourinary:  External genitalia normal Vagina without discharge Cervix closed no lesions No cervical motion tenderness Adnexa palpated, no masses or tenderness noted Bladder palpated no tenderness Uterus palpated mildly tender diffusely  Musculoskeletal: Normal range of motion. She exhibits no edema and no tenderness.  Lymphadenopathy:    She has no cervical adenopathy.  Neurological: She is alert and oriented to person, place, and time. She exhibits normal muscle tone. Coordination normal.  Skin: Skin is warm and dry. No rash noted. No erythema. No pallor.  Psychiatric: She has a normal mood and affect. Her behavior is normal. Judgment and thought content normal.    ED Course   Procedures (including critical care time)  Labs Reviewed  CBC WITH DIFFERENTIAL - Abnormal; Notable for the following:    Hemoglobin 11.1 (*)    HCT 33.1 (*)    RDW 15.9 (*)    All other components within normal  limits  HCG, QUANTITATIVE, PREGNANCY - Abnormal; Notable for the following:    hCG, Beta Chain, Quant, S 12524 (*)    All other components within normal limits  POCT PREGNANCY, URINE - Abnormal; Notable for the following:    Preg Test, Ur POSITIVE (*)    All other components within normal limits  WET PREP, GENITAL  GC/CHLAMYDIA PROBE AMP  URINALYSIS, ROUTINE W REFLEX MICROSCOPIC   US Ob Comp Less 14 Wks  01/15/2013   *RADIOLOGY REPORT*  Clinical Data: Patient with left lower quadrant pain.  Quantitative HCG 12,524.  OBSTETRIC <14 WK [place] AND TRANSVAGINAL OB US  Technique:  Both transabdominal and transvaginal ultrasound examinations were performed for complete evaluation of the gestation as well as the maternal uterus, adnexal regions, and pelvic cul-de-sac.  Transvaginal technique was performed to assess early pregnancy.  Comparison:  None.  Intrauterine gestational sac:  Visualized/normal in shape. Yolk sac: Visualized. Embryo: Visualized. Cardiac Activity: Detected. Heart rate could not be determined via M mode due to early gestational age and uterine orientation.  Cine imaging confirms cardiac activity.  CRL: 3.9  mm  6 w  0 d        [place] EDC: 09/10/2013  Maternal uterus/adnexae: Corpus luteum cyst on the right is noted.  Small subchorionic hemorrhage is identified measuring 1.6 x 0.5 x 0.4 cm.  IMPRESSION: Single living intrauterine pregnancy with a small subchorionic hemorrhage.   Original Report Authenticated By: Orlean Patten, M.D.   US Ob Transvaginal  01/15/2013   *RADIOLOGY REPORT*  Clinical Data: Patient with left lower quadrant pain.  Quantitative HCG 12,524.  OBSTETRIC <14 WK [place] AND TRANSVAGINAL OB US  Technique:  Both transabdominal and transvaginal ultrasound examinations were performed for complete evaluation of the gestation as well as the maternal uterus, adnexal regions, and pelvic cul-de-sac.  Transvaginal technique was performed to assess early pregnancy.  Comparison:  None.   Intrauterine gestational sac:  Visualized/normal in shape. Yolk sac: Visualized. Embryo: Visualized. Cardiac Activity: Detected. Heart rate could not be determined via M mode due to early gestational age and uterine orientation.  Cine imaging confirms cardiac activity.  CRL: 3.9  mm  6 w  0 d        [place] EDC: 09/10/2013  Maternal uterus/adnexae: Corpus luteum cyst on the right is noted.  Small subchorionic hemorrhage is identified measuring 1.6 x 0.5 x 0.4 cm.  IMPRESSION: Single living intrauterine pregnancy with a small subchorionic hemorrhage.   Original Report Authenticated By: Orlean Patten, M.D.   1. Pregnancy   2. Subchorionic hemorrhage in first trimester     MDM  22 yo female with llq pain, reports +home pregnancy test.  Will get pelvic exam, labs, plan for tv u/s for location of pregnancy.  Michelene Heady  Eather Colas, MD 01/15/13 [phone]

## 2013-02-01 ENCOUNTER — Encounter (HOSPITAL_COMMUNITY): Payer: Self-pay

## 2013-02-01 ENCOUNTER — Inpatient Hospital Stay (HOSPITAL_COMMUNITY)
Admission: AD | Admit: 2013-02-01 | Discharge: 2013-02-01 | Disposition: A | Discharge status: Home / Self Care | Payer: Self-pay | Source: Ambulatory Visit | Attending: Obstetrics & Gynecology | Admitting: Obstetrics & Gynecology

## 2013-02-01 NOTE — MAU Note (Signed)
#  2 not in lobby

## 2013-02-01 NOTE — MAU Note (Signed)
Not in lobby#1

## 2013-02-01 NOTE — MAU Note (Signed)
Not in lobby #3

## 2013-02-12 ENCOUNTER — Inpatient Hospital Stay (HOSPITAL_COMMUNITY)
Admission: AD | Admit: 2013-02-12 | Discharge: 2013-02-12 | Disposition: A | Discharge status: Home / Self Care | Payer: Medicaid Other | Source: Ambulatory Visit | Attending: Obstetrics & Gynecology | Admitting: Obstetrics & Gynecology

## 2013-02-12 ENCOUNTER — Encounter (HOSPITAL_COMMUNITY): Payer: Self-pay | Admitting: *Deleted

## 2013-02-12 DIAGNOSIS — Z3490 Encounter for supervision of normal pregnancy, unspecified, unspecified trimester: Secondary | ICD-10-CM

## 2013-02-12 DIAGNOSIS — R109 Unspecified abdominal pain: Secondary | ICD-10-CM | POA: Insufficient documentation

## 2013-02-12 DIAGNOSIS — A499 Bacterial infection, unspecified: Secondary | ICD-10-CM | POA: Insufficient documentation

## 2013-02-12 DIAGNOSIS — B9689 Other specified bacterial agents as the cause of diseases classified elsewhere: Secondary | ICD-10-CM | POA: Insufficient documentation

## 2013-02-12 DIAGNOSIS — N760 Acute vaginitis: Secondary | ICD-10-CM

## 2013-02-12 DIAGNOSIS — O2390 Unspecified genitourinary tract infection in pregnancy, unspecified trimester: Secondary | ICD-10-CM

## 2013-02-12 LAB — WET PREP, GENITAL
Trich, Wet Prep: NONE SEEN
Yeast Wet Prep HPF POC: NONE SEEN

## 2013-02-12 LAB — URINALYSIS, ROUTINE W REFLEX MICROSCOPIC
Glucose, UA: NEGATIVE mg/dL
Ketones, ur: NEGATIVE mg/dL
Leukocytes, UA: NEGATIVE
Nitrite: NEGATIVE
pH: 7 (ref 5.0–8.0)

## 2013-02-12 MED ORDER — METRONIDAZOLE 500 MG PO TABS: 500.0000 mg | ORAL_TABLET | Freq: Two times a day (BID) | ORAL | Status: DC

## 2013-02-12 NOTE — MAU Provider Note (Signed)
History     CSN: [id number]  Arrival date and time: 02/12/13 1333   None     Chief Complaint  Patient presents with  . Abdominal Cramping   HPI Sharon Clayton is 22 y.o. G2P1001 60w0dweeks presenting with ?UTI sxs--sensation with urination and an unusual odor.  Denies vaginal bleeding or discharge.   Denies fever, chills.  She complains of fatigue and a 4 year hx of diarrhea, dx with IBS by Dr. MCollene Mares   Patient was seen early in pregnancy, with U/S showing viable IUP with [facility]  Patient denies any further vaginal bleeding.    Past Medical History  Diagnosis Date  . IBS (irritable bowel syndrome)   . H/O varicella 09/10/11  . Low iron 09/10/11  . Yeast infection 09/10/11  . Bacterial infection 09/10/11  . Trichomonas 09/10/10    Past Surgical History  Procedure Laterality Date  . Cesarean section  09/10/11    Family History  Problem Relation Age of Onset  . Hypertension Mother     History  Substance Use Topics  . Smoking status: Current Every Day Smoker -- 3.00 packs/day for 8 years    Types: Cigarettes  . Smokeless tobacco: Never Used  . Alcohol Use: No    Allergies:  Allergies  Allergen Reactions  . Lactose Intolerance (Gi) Diarrhea and Nausea And Vomiting    Prescriptions prior to admission  Medication Sig Dispense Refill  . acetaminophen (TYLENOL) 500 MG tablet Take 1,000 mg by mouth every 6 (six) hours as needed for pain (back ache).      . Prenatal Vit-Fe Fumarate-FA (PRENATAL MULTIVITAMIN) TABS tablet Take 1 tablet by mouth daily at 12 noon.      . saccharomyces boulardii (FLORASTOR) 250 MG capsule Take 250 mg by mouth 2 (two) times daily.      . [DISCONTINUED] prenatal vitamin w/FE, FA (PRENATAL 1 + 1) 27-1 MG TABS Take 1 tablet by mouth daily.          Review of Systems  Gastrointestinal: Negative for nausea, vomiting and abdominal pain.  Genitourinary: Negative for dysuria.       + for urinary odor.  Neg for vaginal bleeding or discharge   Physical  Exam   Blood pressure 108/57, pulse 74, temperature 98.3 F (36.8 C), temperature source Oral, resp. rate 16, height 5' 1"  (1.549 m), weight 154 lb 12.8 oz (70.217 kg), last menstrual period 12/07/2012, SpO2 100.00%.  Physical Exam  Constitutional: She is oriented to person, place, and time. She appears well-developed and well-nourished. No distress.  HENT:  Head: Normocephalic.  Neck: Normal range of motion.  Cardiovascular: Normal rate.   Respiratory: Effort normal.  GI: Soft. She exhibits no distension and no mass. There is no tenderness. There is no rebound and no guarding.  Genitourinary: There is no rash, tenderness or lesion on the right labia. There is no rash or tenderness on the left labia. Uterus is enlarged (9-10 week size,). Uterus is not tender. Cervix exhibits no motion tenderness and no friability. Right adnexum displays no mass, no tenderness and no fullness. Left adnexum displays no mass, no tenderness and no fullness. No bleeding around the vagina. Vaginal discharge (small amount of white discharge without odor) found.  Neurological: She is alert and oriented to person, place, and time.  Skin: Skin is warm and dry.  Psychiatric: She has a normal mood and affect. Her behavior is normal.   Results for orders placed during the hospital encounter of 02/12/13 (from  the past 24 hour(s))  URINALYSIS, ROUTINE W REFLEX MICROSCOPIC     Status: Abnormal   Collection Time    02/12/13  3:02 PM      Result Value Range   Color, Urine YELLOW  YELLOW   APPearance CLEAR  CLEAR   Specific Gravity, Urine <1.005 (*) 1.005 - 1.030   pH 7.0  5.0 - 8.0   Glucose, UA NEGATIVE  NEGATIVE mg/dL   Hgb urine dipstick NEGATIVE  NEGATIVE   Bilirubin Urine NEGATIVE  NEGATIVE   Ketones, ur NEGATIVE  NEGATIVE mg/dL   Protein, ur NEGATIVE  NEGATIVE mg/dL   Urobilinogen, UA 0.2  0.0 - 1.0 mg/dL   Nitrite NEGATIVE  NEGATIVE   Leukocytes, UA NEGATIVE  NEGATIVE  WET PREP, GENITAL     Status: Abnormal    Collection Time    02/12/13  5:00 PM      Result Value Range   Yeast Wet Prep HPF POC NONE SEEN  NONE SEEN   Trich, Wet Prep NONE SEEN  NONE SEEN   Clue Cells Wet Prep HPF POC FEW (*) NONE SEEN   WBC, Wet Prep HPF POC FEW (*) NONE SEEN   MAU Course  Procedures  MDM   Assessment and Plan  A:  Bacterial vaginosis     10w gestation     NEG for UTI   P:  Patient plans care with [facility]      Rx for flagyl     Continue prenatal vitamins  KEY,EVE M 02/12/2013, 4:49 PM   Attestation of Attending Supervision of Advanced Practitioner (PA/CNM/NP): Evaluation and management procedures were performed by the Advanced Practitioner under my supervision and collaboration.  I have reviewed the Advanced Practitioner's note and chart, and I agree with the management and plan.  Verita Schneiders, MD, [place] Attending [place], [place]

## 2013-02-12 NOTE — MAU Note (Signed)
Patient states she has been cramping since yesterday. Has had a funny feeling when urinating with possible UTI. Denies bleeding or discharge and no pain today.

## 2013-02-19 ENCOUNTER — Emergency Department (HOSPITAL_COMMUNITY)
Admission: EM | Admit: 2013-02-19 | Discharge: 2013-02-19 | Disposition: A | Discharge status: Home / Self Care | Payer: Medicaid Other | Attending: Emergency Medicine | Admitting: Emergency Medicine

## 2013-02-19 ENCOUNTER — Encounter (HOSPITAL_COMMUNITY): Payer: Self-pay | Admitting: Cardiology

## 2013-02-19 DIAGNOSIS — Z8619 Personal history of other infectious and parasitic diseases: Secondary | ICD-10-CM | POA: Insufficient documentation

## 2013-02-19 DIAGNOSIS — R51 Headache: Secondary | ICD-10-CM | POA: Insufficient documentation

## 2013-02-19 DIAGNOSIS — O9989 Other specified diseases and conditions complicating pregnancy, childbirth and the puerperium: Secondary | ICD-10-CM | POA: Insufficient documentation

## 2013-02-19 DIAGNOSIS — Z8719 Personal history of other diseases of the digestive system: Secondary | ICD-10-CM | POA: Insufficient documentation

## 2013-02-19 DIAGNOSIS — Z862 Personal history of diseases of the blood and blood-forming organs and certain disorders involving the immune mechanism: Secondary | ICD-10-CM | POA: Insufficient documentation

## 2013-02-19 DIAGNOSIS — O99330 Smoking (tobacco) complicating pregnancy, unspecified trimester: Secondary | ICD-10-CM | POA: Insufficient documentation

## 2013-02-19 MED ORDER — ACETAMINOPHEN 325 MG PO TABS
650.0000 mg | ORAL_TABLET | Freq: Once | ORAL | Status: AC
  Administered 2013-02-19: 650 mg via ORAL
  Filled 2013-02-19: qty 2

## 2013-02-19 MED ORDER — ONDANSETRON 4 MG PO TBDP
8.0000 mg | ORAL_TABLET | Freq: Once | ORAL | Status: AC
  Administered 2013-02-19: 8 mg via ORAL
  Filled 2013-02-19: qty 2

## 2013-02-19 NOTE — ED Notes (Signed)
Ashok Cordia, MD at bedside.

## 2013-02-19 NOTE — ED Notes (Signed)
Pt states that she has been having tingling in her head. Pt denies pain at this moment. Pt also states that she has been stressed recently, and she has been experiencing mild cramping with her pregnancy. Pt denies vaginal bleeding.

## 2013-02-19 NOTE — ED Provider Notes (Signed)
CSN: [id number]     Arrival date & time 02/19/13  1652 History   First MD Initiated Contact with Patient 02/19/13 1954     Chief Complaint  Patient presents with  . Headache   (Consider location/radiation/quality/duration/timing/severity/associated sxs/prior Treatment) Patient is a 22 y.o. female presenting with headaches. The history is provided by the patient.  Headache Associated symptoms: no abdominal pain, no back pain, no congestion, no pain, no fever, no neck pain, no numbness, no sinus pressure and no vomiting   pt presents c/o onset dull headache after getting up from nap earlier today. Mild-mod, dull, left. States similar headache in past, but doesn't get frequent headaches. No hx migraines. States had been under a lot of stress lately, lost job yesterday, states was feeling very tense. Headache was gradual in onset, mild at onset. No acute or abrupt worsening since. States is sl improved currently. Mild nausea. No vomiting. No recent head injury or fall. No sinus drainage or congestion. No uri c/o. No eye pain or change in vision. No change in speech. No numbness/weakness. No problems w balance or coordination. Has not taken any meds since onset headache. Denies neck pain or stiffness. No pain w rom neck. Is [redacted] weeks pregnant. No vaginal discharge or bleeding, no abd or pelvic pain. States had u/s last month confirming iup. Is planning to see ob/gyn.     Past Medical History  Diagnosis Date  . IBS (irritable bowel syndrome)   . H/O varicella 09/10/11  . Low iron 09/10/11  . Yeast infection 09/10/11  . Bacterial infection 09/10/11  . Trichomonas 09/10/10   Past Surgical History  Procedure Laterality Date  . Cesarean section  09/10/11   Family History  Problem Relation Age of Onset  . Hypertension Mother    History  Substance Use Topics  . Smoking status: Current Every Day Smoker -- 3.00 packs/day for 8 years    Types: Cigarettes  . Smokeless tobacco: Never Used  . Alcohol Use: No    OB History   Grav Para Term Preterm Abortions TAB SAB Ect Mult Living   2 1 1  0 0 0 0 0 0 1     Review of Systems  Constitutional: Negative for fever and chills.  HENT: Negative for congestion, neck pain and sinus pressure.   Eyes: Negative for pain, redness and visual disturbance.  Respiratory: Negative for shortness of breath.   Cardiovascular: Negative for chest pain.  Gastrointestinal: Negative for vomiting and abdominal pain.  Genitourinary: Negative for flank pain.  Musculoskeletal: Negative for back pain.  Skin: Negative for rash.  Neurological: Positive for headaches. Negative for syncope, weakness, light-headedness and numbness.  Hematological: Does not bruise/bleed easily.  Psychiatric/Behavioral: Negative for confusion.    Allergies  Lactose intolerance (gi)  Home Medications   Current Outpatient Rx  Name  Route  Sig  Dispense  Refill  . acetaminophen (TYLENOL) 500 MG tablet   Oral   Take 1,000 mg by mouth daily as needed for pain (back ache).          . Prenatal Vit-Fe Fumarate-FA (PRENATAL MULTIVITAMIN) TABS tablet   Oral   Take 1 tablet by mouth at bedtime.           BP 120/61  Pulse 85  Temp(Src) 98.9 F (37.2 C) (Oral)  Resp 18  SpO2 98%  LMP 12/07/2012 Physical Exam  Nursing note and vitals reviewed. Constitutional: She is oriented to person, place, and time. She appears well-developed and well-nourished. No  distress.  HENT:  Head: Atraumatic.  Nose: Nose normal.  Mouth/Throat: Oropharynx is clear and moist.  No sinus or temporal tenderness.  Eyes: Conjunctivae and EOM are normal. Pupils are equal, round, and reactive to light. No scleral icterus.  Neck: Normal range of motion. Neck supple. No tracheal deviation present. No thyromegaly present.  No stiffness or rigidity.   Cardiovascular: Normal rate, regular rhythm, normal heart sounds and intact distal pulses.  Exam reveals no gallop and no friction rub.   No murmur  heard. Pulmonary/Chest: Effort normal and breath sounds normal. No respiratory distress.  Abdominal: Soft. Normal appearance and bowel sounds are normal. She exhibits no distension. There is no tenderness.  Genitourinary:  No cva tenderness.  Musculoskeletal: Normal range of motion. She exhibits no edema and no tenderness.  Neurological: She is alert and oriented to person, place, and time. No cranial nerve deficit.  Motor intact bilaterally. Steady gait.   Skin: Skin is warm and dry. No rash noted. She is not diaphoretic.  Psychiatric: She has a normal mood and affect.    ED Course  Procedures (including critical care time)   MDM  No meds pta or today for headache.  Tylenol po. zofran po.  Normal neuro exam. Pt feels much improved.  Vitals normal. Pt comfortable. Appears stable for d/c.      Mirna Mires, MD 02/20/13 [phone]

## 2013-02-19 NOTE — ED Notes (Signed)
Pt to department via EMS- pt reports she woke up around 1600 with a headache on the left side her head. Denies any n/v. Reports increased stress and [redacted] weeks pregnant. Bp-124/80 Hr-84 RR-18

## 2013-02-28 ENCOUNTER — Encounter (HOSPITAL_COMMUNITY): Payer: Self-pay | Admitting: *Deleted

## 2013-02-28 ENCOUNTER — Inpatient Hospital Stay (HOSPITAL_COMMUNITY)
Admission: AD | Admit: 2013-02-28 | Discharge: 2013-02-28 | Disposition: A | Discharge status: Home / Self Care | Payer: Medicaid Other | Source: Ambulatory Visit | Attending: Obstetrics & Gynecology | Admitting: Obstetrics & Gynecology

## 2013-02-28 DIAGNOSIS — R109 Unspecified abdominal pain: Secondary | ICD-10-CM | POA: Insufficient documentation

## 2013-02-28 DIAGNOSIS — B9689 Other specified bacterial agents as the cause of diseases classified elsewhere: Secondary | ICD-10-CM

## 2013-02-28 DIAGNOSIS — A499 Bacterial infection, unspecified: Secondary | ICD-10-CM | POA: Insufficient documentation

## 2013-02-28 DIAGNOSIS — N760 Acute vaginitis: Secondary | ICD-10-CM | POA: Insufficient documentation

## 2013-02-28 DIAGNOSIS — O2390 Unspecified genitourinary tract infection in pregnancy, unspecified trimester: Secondary | ICD-10-CM | POA: Insufficient documentation

## 2013-02-28 DIAGNOSIS — O99891 Other specified diseases and conditions complicating pregnancy: Secondary | ICD-10-CM | POA: Insufficient documentation

## 2013-02-28 LAB — URINALYSIS, ROUTINE W REFLEX MICROSCOPIC
Bilirubin Urine: NEGATIVE
Ketones, ur: 15 mg/dL — AB
Nitrite: NEGATIVE
Urobilinogen, UA: 0.2 mg/dL (ref 0.0–1.0)
pH: 7 (ref 5.0–8.0)

## 2013-02-28 LAB — WET PREP, GENITAL: Trich, Wet Prep: NONE SEEN

## 2013-02-28 LAB — OB RESULTS CONSOLE GC/CHLAMYDIA
Chlamydia: NEGATIVE
Gonorrhea: NEGATIVE

## 2013-02-28 MED ORDER — PRENATAL VITAMINS 0.8 MG PO TABS: 1.0000 | ORAL_TABLET | Freq: Every day | ORAL | Status: DC

## 2013-02-28 MED ORDER — METRONIDAZOLE 500 MG PO TABS: 500.0000 mg | ORAL_TABLET | Freq: Two times a day (BID) | ORAL | Status: DC

## 2013-02-28 NOTE — MAU Note (Signed)
Patient presented today to hear FHT's but after arrival states that she has had left sided suprapubic pain x 1 week that is sharp and intermittent in nature.

## 2013-02-28 NOTE — MAU Note (Signed)
Pt presents with concerns about her pregnancy. She states that she has been stressed and wants to be able hear her  baby's heart rate is ok. She states that she lost her job last week and has no insurance for her to have prenatal care.

## 2013-02-28 NOTE — MAU Note (Signed)
Speculum exam completed by provider with cultures obtained and vaginal exam performed.

## 2013-02-28 NOTE — MAU Provider Note (Signed)
History     CSN: [id number]  Arrival date and time: 02/28/13 1307   First Provider Initiated Contact with Patient 02/28/13 1416      No chief complaint on file.  HPI Ms. Dondi Burandt is a 22 y.o. female; G2P1001 at 54w2dwho presents to MAU to hear the babies heart beat. During questioning she also complains of left sided abdominal pain; feels like cramping. Intercourse was somewhat uncomfortable last week on the left side. Intercourse was 3 days ago and it was not uncomfortable.  She has applied for medicaid and [facility] She denies abnormal discharge, no vaginal bleeding.   OB History   Grav Para Term Preterm Abortions TAB SAB Ect Mult Living   2 1 1  0 0 0 0 0 0 1      Past Medical History  Diagnosis Date  . IBS (irritable bowel syndrome)   . H/O varicella 09/10/11  . Low iron 09/10/11  . Yeast infection 09/10/11  . Bacterial infection 09/10/11  . Trichomonas 09/10/10    Past Surgical History  Procedure Laterality Date  . Cesarean section  09/10/11    Family History  Problem Relation Age of Onset  . Hypertension Mother     History  Substance Use Topics  . Smoking status: Current Every Day Smoker -- 3.00 packs/day for 8 years    Types: Cigarettes  . Smokeless tobacco: Never Used  . Alcohol Use: No    Allergies:  Allergies  Allergen Reactions  . Lactose Intolerance (Gi) Diarrhea and Nausea And Vomiting    Prescriptions prior to admission  Medication Sig Dispense Refill  . acetaminophen (TYLENOL) 500 MG tablet Take 1,000 mg by mouth daily as needed for pain (back ache).       . Prenatal Vit-Fe Sulfate-FA (PRENATAL MULTIVIT-IRON PO) Take 2 tablets by mouth daily.       Results for orders placed during the hospital encounter of 02/28/13 (from the past 24 hour(s))  URINALYSIS, ROUTINE W REFLEX MICROSCOPIC     Status: Abnormal   Collection Time    02/28/13  1:20 PM      Result Value Range   Color, Urine YELLOW  YELLOW   APPearance  CLEAR  CLEAR   Specific Gravity, Urine 1.025  1.005 - 1.030   pH 7.0  5.0 - 8.0   Glucose, UA NEGATIVE  NEGATIVE mg/dL   Hgb urine dipstick NEGATIVE  NEGATIVE   Bilirubin Urine NEGATIVE  NEGATIVE   Ketones, ur 15 (*) NEGATIVE mg/dL   Protein, ur NEGATIVE  NEGATIVE mg/dL   Urobilinogen, UA 0.2  0.0 - 1.0 mg/dL   Nitrite NEGATIVE  NEGATIVE   Leukocytes, UA NEGATIVE  NEGATIVE  WET PREP, GENITAL     Status: Abnormal   Collection Time    02/28/13  2:22 PM      Result Value Range   Yeast Wet Prep HPF POC NONE SEEN  NONE SEEN   Trich, Wet Prep NONE SEEN  NONE SEEN   Clue Cells Wet Prep HPF POC MODERATE (*) NONE SEEN   WBC, Wet Prep HPF POC FEW (*) NONE SEEN   Review of Systems  Constitutional: Negative for fever and chills.  Gastrointestinal: Positive for abdominal pain and diarrhea. Negative for nausea, vomiting and constipation.       +left lower abdominal discomfort  No diarrhea; chronic problem has dealt with it for years +IBS   Genitourinary: Negative for dysuria, urgency and frequency.  Musculoskeletal:  Positive for back pain.   Physical Exam   Blood pressure 141/83, pulse 80, temperature 97.8 F (36.6 C), resp. rate 18, height 5' 1"  (1.549 m), weight 71.215 kg (157 lb), last menstrual period 12/07/2012, unknown if currently breastfeeding. Fetal heart rate by doppler: 157 bpm    Physical Exam  Constitutional: She is oriented to person, place, and time. She appears well-developed and well-nourished. No distress.  Neck: Neck supple.  Respiratory: Effort normal.  GI: Soft. She exhibits no distension. There is no tenderness. There is no rebound and no guarding.  Genitourinary: Vaginal discharge found.  Speculum exam: Vagina - Small amount of creamy discharge, no odor Cervix - No contact bleeding Bimanual exam: Cervix closed Uterus non tender, normal size Adnexa non tender, + suprapubic tenderness, no masses bilaterally GC/Chlam, wet prep done Chaperone present for exam.    Neurological: She is alert and oriented to person, place, and time.  Skin: Skin is warm and dry. She is not diaphoretic.  Psychiatric: Her behavior is normal.    MAU Course  Procedures  MDM +fht UA Wet prep GC/Chlamydia- Pending   Pt was diagnosed with BV at last visit and did not pick up the medication that was prescribed    Assessment and Plan  A: Bacterial vaginosis Ketones in the urine   P:  Discharge home Drink at least 8 8-oz glasses of water every day. RX: flagyl 500 mg PO BID times 7 days (#14) no rf        Prenatal vitamins Follow up with [facility]  Return to MAU with worsening symptoms  Levonia Wolfley IRENE FNP-C 02/28/2013, 4:26 PM

## 2013-03-01 LAB — GC/CHLAMYDIA PROBE AMP: GC Probe RNA: NEGATIVE

## 2013-03-25 ENCOUNTER — Inpatient Hospital Stay (HOSPITAL_COMMUNITY)
Admission: AD | Admit: 2013-03-25 | Discharge: 2013-03-25 | Disposition: A | Discharge status: Home / Self Care | Payer: Medicaid Other | Source: Ambulatory Visit | Attending: Obstetrics & Gynecology | Admitting: Obstetrics & Gynecology

## 2013-03-25 ENCOUNTER — Encounter (HOSPITAL_COMMUNITY): Payer: Self-pay | Admitting: *Deleted

## 2013-03-25 DIAGNOSIS — R197 Diarrhea, unspecified: Secondary | ICD-10-CM

## 2013-03-25 DIAGNOSIS — K5289 Other specified noninfective gastroenteritis and colitis: Secondary | ICD-10-CM

## 2013-03-25 DIAGNOSIS — K589 Irritable bowel syndrome without diarrhea: Secondary | ICD-10-CM | POA: Insufficient documentation

## 2013-03-25 DIAGNOSIS — R110 Nausea: Secondary | ICD-10-CM

## 2013-03-25 LAB — CBC WITH DIFFERENTIAL/PLATELET
Basophils Absolute: 0 10*3/uL (ref 0.0–0.1)
Basophils Relative: 0 % (ref 0–1)
Hemoglobin: 10.5 g/dL — ABNORMAL LOW (ref 12.0–15.0)
Lymphs Abs: 1.6 10*3/uL (ref 0.7–4.0)
MCHC: 34.5 g/dL (ref 30.0–36.0)
MCV: 81.9 fL (ref 78.0–100.0)
Neutro Abs: 4.3 10*3/uL (ref 1.7–7.7)
Neutrophils Relative %: 67 % (ref 43–77)
Platelets: 245 10*3/uL (ref 150–400)
RDW: 14.6 % (ref 11.5–15.5)

## 2013-03-25 LAB — URINALYSIS, ROUTINE W REFLEX MICROSCOPIC
Bilirubin Urine: NEGATIVE
Hgb urine dipstick: NEGATIVE
Ketones, ur: NEGATIVE mg/dL
Leukocytes, UA: NEGATIVE
Nitrite: NEGATIVE
Protein, ur: NEGATIVE mg/dL
Urobilinogen, UA: 0.2 mg/dL (ref 0.0–1.0)
pH: 7 (ref 5.0–8.0)

## 2013-03-25 MED ORDER — ONDANSETRON 4 MG PO TBDP
4.0000 mg | ORAL_TABLET | Freq: Once | ORAL | Status: AC
  Administered 2013-03-25: 4 mg via ORAL
  Filled 2013-03-25: qty 1

## 2013-03-25 MED ORDER — ONDANSETRON HCL 4 MG PO TABS: 4.0000 mg | ORAL_TABLET | Freq: Three times a day (TID) | ORAL | Status: DC | PRN

## 2013-03-25 NOTE — MAU Provider Note (Signed)
Attestation of Attending Supervision of Advanced Practitioner (PA/CNM/NP): Evaluation and management procedures were performed by the Advanced Practitioner under my supervision and collaboration.  I have reviewed the Advanced Practitioner's note and chart, and I agree with the management and plan.  Verita Schneiders, MD, [place] Attending [place], [place]

## 2013-03-25 NOTE — Progress Notes (Signed)
Pt states pain originates in back and moves to the front

## 2013-03-25 NOTE — Progress Notes (Signed)
Pt states she has had dizzy spells

## 2013-03-25 NOTE — MAU Provider Note (Signed)
History     CSN: [id number]  Arrival date and time: 03/25/13 1756   First Provider Initiated Contact with Patient 03/25/13 1936      No chief complaint on file.  HPI  Ms. Sharon Clayton is a 22 y.o. female G3P1001 at Unknown gestation who presents with diarrhea. She has a history of IBS and is used to having diarrhea. At times it will clear up and come back without much problem. She is pregnant, and concerned that she was dehydrated so she came for evaluation. She has not started prenatal care, however has an appointment with the clinic on the 29th of this month. In the last 24 hours she has had diarrhea four times. She feels this is related to IBS however may be a bug or something she ate. Her fever has been up and down.   OB History   Grav Para Term Preterm Abortions TAB SAB Ect Mult Living   3 1 1  0 0 0 0 0 0 1      Past Medical History  Diagnosis Date  . IBS (irritable bowel syndrome)   . H/O varicella 09/10/11  . Low iron 09/10/11  . Yeast infection 09/10/11  . Bacterial infection 09/10/11  . Trichomonas 09/10/10    Past Surgical History  Procedure Laterality Date  . Cesarean section  09/10/11    Family History  Problem Relation Age of Onset  . Hypertension Mother     History  Substance Use Topics  . Smoking status: Current Every Day Smoker -- 3.00 packs/day for 8 years    Types: Cigarettes  . Smokeless tobacco: Never Used  . Alcohol Use: No    Allergies:  Allergies  Allergen Reactions  . Lactose Intolerance (Gi) Diarrhea and Nausea And Vomiting    Prescriptions prior to admission  Medication Sig Dispense Refill  . acetaminophen (TYLENOL) 500 MG tablet Take 1,000 mg by mouth daily as needed for pain (back ache).       . metroNIDAZOLE (FLAGYL) 500 MG tablet Take 1 tablet (500 mg total) by mouth 2 (two) times daily.  14 tablet  0  . Prenatal Multivit-Min-Fe-FA (PRENATAL VITAMINS) 0.8 MG tablet Take 1 tablet by mouth daily.  30 tablet  1  . Prenatal Vit-Fe  Sulfate-FA (PRENATAL MULTIVIT-IRON PO) Take 2 tablets by mouth daily.       Results for orders placed during the hospital encounter of 03/25/13 (from the past 24 hour(s))  URINALYSIS, ROUTINE W REFLEX MICROSCOPIC     Status: None   Collection Time    03/25/13  6:25 PM      Result Value Range   Color, Urine YELLOW  YELLOW   APPearance CLEAR  CLEAR   Specific Gravity, Urine 1.015  1.005 - 1.030   pH 7.0  5.0 - 8.0   Glucose, UA NEGATIVE  NEGATIVE mg/dL   Hgb urine dipstick NEGATIVE  NEGATIVE   Bilirubin Urine NEGATIVE  NEGATIVE   Ketones, ur NEGATIVE  NEGATIVE mg/dL   Protein, ur NEGATIVE  NEGATIVE mg/dL   Urobilinogen, UA 0.2  0.0 - 1.0 mg/dL   Nitrite NEGATIVE  NEGATIVE   Leukocytes, UA NEGATIVE  NEGATIVE  CBC WITH DIFFERENTIAL     Status: Abnormal   Collection Time    03/25/13  7:47 PM      Result Value Range   WBC 6.4  4.0 - 10.5 K/uL   RBC 3.71 (*) 3.87 - 5.11 MIL/uL   Hemoglobin 10.5 (*) 12.0 - 15.0 g/dL  HCT 30.4 (*) 36.0 - 46.0 %   MCV 81.9  78.0 - 100.0 fL   MCH 28.3  26.0 - 34.0 pg   MCHC 34.5  30.0 - 36.0 g/dL   RDW 14.6  11.5 - 15.5 %   Platelets 245  150 - 400 K/uL   Neutrophils Relative % 67  43 - 77 %   Neutro Abs 4.3  1.7 - 7.7 K/uL   Lymphocytes Relative 25  12 - 46 %   Lymphs Abs 1.6  0.7 - 4.0 K/uL   Monocytes Relative 7  3 - 12 %   Monocytes Absolute 0.4  0.1 - 1.0 K/uL   Eosinophils Relative 1  0 - 5 %   Eosinophils Absolute 0.1  0.0 - 0.7 K/uL   Basophils Relative 0  0 - 1 %   Basophils Absolute 0.0  0.0 - 0.1 K/uL    Review of Systems  Constitutional: Negative for fever and chills.  Gastrointestinal: Positive for nausea and diarrhea. Negative for heartburn, vomiting and constipation.  Genitourinary: Negative for dysuria, urgency, frequency and hematuria.       No vaginal discharge. No vaginal bleeding. No dysuria.   Neurological: Negative for headaches.   Physical Exam   Blood pressure 110/59, temperature 98.2 F (36.8 C), temperature  source Oral, resp. rate 16, height 5' 1.5" (1.562 m), weight 155 lb 4 oz (70.421 kg), last menstrual period 12/07/2012. Fetal heart tones by doppler: 153 bpm   Physical Exam  Constitutional: She is oriented to person, place, and time. She appears well-developed and well-nourished. No distress.  HENT:  Head: Normocephalic.  Neck: Neck supple.  GI: Soft. She exhibits no distension. There is no tenderness. There is no rebound and no guarding.  Neurological: She is alert and oriented to person, place, and time.  Skin: Skin is warm and dry. She is not diaphoretic.    MAU Course  Procedures None  MDM CBC with diff Zofran 4 mg ODT  +fht   Assessment and Plan  A: IBS flare up VS gastroenteritis  Nausea Diarrhea   P: Discharge home RX: Zofran 4 mg Q8 hours PO as needed Return to MAU if symptoms worsen Keep your appointment with the clinic this month Dehydration signs discussed Replace electrolytes with Gatorade/ smart water.   Felice Deem IRENE FNP-C 03/25/2013, 8:04 PM

## 2013-03-25 NOTE — MAU Note (Signed)
Pt states she had 'yeast infection"

## 2013-03-25 NOTE — MAU Note (Signed)
Pt states she has IBS. Pt states she has also had diarrhea for the past couple a day. Pt want to make sure everything is all right with the pregnancy

## 2013-04-07 ENCOUNTER — Encounter: Payer: Self-pay | Admitting: Family Medicine

## 2013-04-07 ENCOUNTER — Other Ambulatory Visit (HOSPITAL_COMMUNITY): Admission: RE | Admit: 2013-04-07 | Payer: Medicaid Other | Source: Ambulatory Visit | Admitting: Family Medicine

## 2013-04-07 ENCOUNTER — Ambulatory Visit (INDEPENDENT_AMBULATORY_CARE_PROVIDER_SITE_OTHER): Payer: Medicaid Other | Admitting: Family Medicine

## 2013-04-07 VITALS — BP 117/77 | Temp 98.7°F | Ht 61.0 in | Wt 155.0 lb

## 2013-04-07 DIAGNOSIS — O34219 Maternal care for unspecified type scar from previous cesarean delivery: Secondary | ICD-10-CM

## 2013-04-07 DIAGNOSIS — Z3492 Encounter for supervision of normal pregnancy, unspecified, second trimester: Secondary | ICD-10-CM

## 2013-04-07 LAB — POCT URINALYSIS DIP (DEVICE)
Bilirubin Urine: NEGATIVE
Glucose, UA: NEGATIVE mg/dL
Hgb urine dipstick: NEGATIVE
Ketones, ur: NEGATIVE mg/dL
Nitrite: NEGATIVE
Specific Gravity, Urine: 1.01 (ref 1.005–1.030)
pH: 6.5 (ref 5.0–8.0)

## 2013-04-07 NOTE — Patient Instructions (Signed)
Pregnancy - Second Trimester The second trimester of pregnancy (3 to 6 months) is a period of rapid growth for you and your baby. At the end of the sixth month, your baby is about 9 inches long and weighs 1 1/2 pounds. You will begin to feel the baby move between 18 and 20 weeks of the pregnancy. This is called quickening. Weight gain is faster. A clear fluid (colostrum) may leak out of your breasts. You may feel small contractions of the womb (uterus). This is known as false labor or Braxton-Hicks contractions. This is like a practice for labor when the baby is ready to be born. Usually, the problems with morning sickness have usually passed by the end of your first trimester. Some women develop small dark blotches (called cholasma, mask of pregnancy) on their face that usually goes away after the baby is born. Exposure to the sun makes the blotches worse. Acne may also develop in some pregnant women and pregnant women who have acne, may find that it goes away. PRENATAL EXAMS  Blood work may continue to be done during prenatal exams. These tests are done to check on your health and the probable health of your baby. Blood work is used to follow your blood levels (hemoglobin). Anemia (low hemoglobin) is common during pregnancy. Iron and vitamins are given to help prevent this. You will also be checked for diabetes between 24 and 28 weeks of the pregnancy. Some of the previous blood tests may be repeated.  The size of the uterus is measured during each visit. This is to make sure that the baby is continuing to grow properly according to the dates of the pregnancy.  Your blood pressure is checked every prenatal visit. This is to make sure you are not getting toxemia.  Your urine is checked to make sure you do not have an infection, diabetes or protein in the urine.  Your weight is checked often to make sure gains are happening at the suggested rate. This is to ensure that both you and your baby are  growing normally.  Sometimes, an ultrasound is performed to confirm the proper growth and development of the baby. This is a test which bounces harmless sound waves off the baby so your caregiver can more accurately determine due dates. Sometimes, a test is done on the amniotic fluid surrounding the baby. This test is called an amniocentesis. The amniotic fluid is obtained by sticking a needle into the belly (abdomen). This is done to check the chromosomes in instances where there is a concern about possible genetic problems with the baby. It is also sometimes done near the end of pregnancy if an early delivery is required. In this case, it is done to help make sure the baby's lungs are mature enough for the baby to live outside of the womb. CHANGES OCCURING IN THE SECOND TRIMESTER OF PREGNANCY Your body goes through many changes during pregnancy. They vary from person to person. Talk to your caregiver about changes you notice that you are concerned about.  During the second trimester, you will likely have an increase in your appetite. It is normal to have cravings for certain foods. This varies from person to person and pregnancy to pregnancy.  Your lower abdomen will begin to bulge.  You may have to urinate more often because the uterus and baby are pressing on your bladder. It is also common to get more bladder infections during pregnancy. You can help this by drinking lots of fluids  and emptying your bladder before and after intercourse.  You may begin to get stretch marks on your hips, abdomen, and breasts. These are normal changes in the body during pregnancy. There are no exercises or medicines to take that prevent this change.  You may begin to develop swollen and bulging veins (varicose veins) in your legs. Wearing support hose, elevating your feet for 15 minutes, 3 to 4 times a day and limiting salt in your diet helps lessen the problem.  Heartburn may develop as the uterus grows and  pushes up against the stomach. Antacids recommended by your caregiver helps with this problem. Also, eating smaller meals 4 to 5 times a day helps.  Constipation can be treated with a stool softener or adding bulk to your diet. Drinking lots of fluids, and eating vegetables, fruits, and whole grains are helpful.  Exercising is also helpful. If you have been very active up until your pregnancy, most of these activities can be continued during your pregnancy. If you have been less active, it is helpful to start an exercise program such as walking.  Hemorrhoids may develop at the end of the second trimester. Warm sitz baths and hemorrhoid cream recommended by your caregiver helps hemorrhoid problems.  Backaches may develop during this time of your pregnancy. Avoid heavy lifting, wear low heal shoes, and practice good posture to help with backache problems.  Some pregnant women develop tingling and numbness of their hand and fingers because of swelling and tightening of ligaments in the wrist (carpel tunnel syndrome). This goes away after the baby is born.  As your breasts enlarge, you may have to get a bigger bra. Get a comfortable, cotton, support bra. Do not get a nursing bra until the last month of the pregnancy if you will be nursing the baby.  You may get a dark line from your belly button to the pubic area called the linea nigra.  You may develop rosy cheeks because of increase blood flow to the face.  You may develop spider looking lines of the face, neck, arms, and chest. These go away after the baby is born. HOME CARE INSTRUCTIONS   It is extremely important to avoid all smoking, herbs, alcohol, and unprescribed drugs during your pregnancy. These chemicals affect the formation and growth of the baby. Avoid these chemicals throughout the pregnancy to ensure the delivery of a healthy infant.  Most of your home care instructions are the same as suggested for the first trimester of your  pregnancy. Keep your caregiver's appointments. Follow your caregiver's instructions regarding medicine use, exercise, and diet.  During pregnancy, you are providing food for you and your baby. Continue to eat regular, well-balanced meals. Choose foods such as meat, fish, milk and other low fat dairy products, vegetables, fruits, and whole-grain breads and cereals. Your caregiver will tell you of the ideal weight gain.  A physical sexual relationship may be continued up until near the end of pregnancy if there are no other problems. Problems could include early (premature) leaking of amniotic fluid from the membranes, vaginal bleeding, abdominal pain, or other medical or pregnancy problems.  Exercise regularly if there are no restrictions. Check with your caregiver if you are unsure of the safety of some of your exercises. The greatest weight gain will occur in the last 2 trimesters of pregnancy. Exercise will help you:  Control your weight.  Get you in shape for labor and delivery.  Lose weight after you have the baby.  Wear  a good support or jogging bra for breast tenderness during pregnancy. This may help if worn during sleep. Pads or tissues may be used in the bra if you are leaking colostrum.  Do not use hot tubs, steam rooms or saunas throughout the pregnancy.  Wear your seat belt at all times when driving. This protects you and your baby if you are in an accident.  Avoid raw meat, uncooked cheese, cat litter boxes, and soil used by cats. These carry germs that can cause birth defects in the baby.  The second trimester is also a good time to visit your dentist for your dental health if this has not been done yet. Getting your teeth cleaned is okay. Use a soft toothbrush. Brush gently during pregnancy.  It is easier to leak urine during pregnancy. Tightening up and strengthening the pelvic muscles will help with this problem. Practice stopping your urination while you are going to the  bathroom. These are the same muscles you need to strengthen. It is also the muscles you would use as if you were trying to stop from passing gas. You can practice tightening these muscles up 10 times a set and repeating this about 3 times per day. Once you know what muscles to tighten up, do not perform these exercises during urination. It is more likely to contribute to an infection by backing up the urine.  Ask for help if you have financial, counseling, or nutritional needs during pregnancy. Your caregiver will be able to offer counseling for these needs as well as refer you for other special needs.  Your skin may become oily. If so, wash your face with mild soap, use non-greasy moisturizer and oil or cream based makeup. MEDICINES AND DRUG USE IN PREGNANCY  Take prenatal vitamins as directed. The vitamin should contain 1 milligram of folic acid. Keep all vitamins out of reach of children. Only a couple vitamins or tablets containing iron may be fatal to a baby or young child when ingested.  Avoid use of all medicines, including herbs, over-the-counter medicines, not prescribed or suggested by your caregiver. Only take over-the-counter or prescription medicines for pain, discomfort, or fever as directed by your caregiver. Do not use aspirin.  Let your caregiver also know about herbs you may be using.  Alcohol is related to a number of birth defects. This includes fetal alcohol syndrome. All alcohol, in any form, should be avoided completely. Smoking will cause low birth rate and premature babies.  Street or illegal drugs are very harmful to the baby. They are absolutely forbidden. A baby born to an addicted mother will be addicted at birth. The baby will go through the same withdrawal an adult does. SEEK MEDICAL CARE IF:  You have any concerns or worries during your pregnancy. It is better to call with your questions if you feel they cannot wait, rather than worry about them. SEEK IMMEDIATE  MEDICAL CARE IF:   An unexplained oral temperature above 102 F (38.9 C) develops, or as your caregiver suggests.  You have leaking of fluid from the vagina (birth canal). If leaking membranes are suspected, take your temperature and tell your caregiver of this when you call.  There is vaginal spotting, bleeding, or passing clots. Tell your caregiver of the amount and how many pads are used. Light spotting in pregnancy is common, especially following intercourse.  You develop a bad smelling vaginal discharge with a change in the color from clear to white.  You continue to feel  sick to your stomach (nauseated) and have no relief from remedies suggested. You vomit blood or coffee ground-like materials.  You lose more than 2 pounds of weight or gain more than 2 pounds of weight over 1 week, or as suggested by your caregiver.  You notice swelling of your face, hands, feet, or legs.  You get exposed to [place] measles and have never had them.  You are exposed to fifth disease or chickenpox.  You develop belly (abdominal) pain. Round ligament discomfort is a common non-cancerous (benign) cause of abdominal pain in pregnancy. Your caregiver still must evaluate you.  You develop a bad headache that does not go away.  You develop fever, diarrhea, pain with urination, or shortness of breath.  You develop visual problems, blurry, or double vision.  You fall or are in a car accident or any kind of trauma.  There is mental or physical violence at home. Document Released: 05/21/2001 Document Revised: 02/19/2012 Document Reviewed: 11/23/2008 [facility] Patient Information 2014 [place].

## 2013-04-07 NOTE — Progress Notes (Signed)
P=85  ; Here for new ob. Given new patient information. Discussed bmi/weight gain.States was feeling the baby move around 12 weeks, but then not felt any for about 2 weeks, States came to [facility]U 1.5 weeks ago and heard baby heartbeat. States has lower back pain for about 3 weeks- sometimes starts in back and moves to stomach

## 2013-04-07 NOTE — Progress Notes (Signed)
Subjective:    Sharon Clayton is a G2P1001 11w2dbeing seen today for her first obstetrical visit.  Her obstetrical history is significant for prior c-section for NRFHT , LTCS. Patient does intend to breast feed. Pregnancy history fully reviewed. And presistent diarrhea   Patient reports backache, nausea, no bleeding, no contractions, no cramping and no leaking.  Filed Vitals:   04/07/13 1007 04/07/13 1010  BP: 117/77   Temp: 98.7 F (37.1 C)   Height:  5' 1"  (1.549 m)  Weight: 155 lb (70.308 kg)     HISTORY: OB History  Gravida Para Term Preterm AB SAB TAB Ectopic Multiple Living  2 1 1  0 0 0 0 0 0 1    # Outcome Date GA Lbr Len/2nd Weight Sex Delivery Anes PTL Lv  2 CUR           1 TRM 06/09/09 336w3d6 lb 13 oz (3.09 kg) M LTCS EPI  Y     [facility]      Past Medical History  Diagnosis Date  . IBS (irritable bowel syndrome)   . H/O varicella 09/10/11  . Low iron 09/10/11  . Yeast infection 09/10/11  . Bacterial infection 09/10/11  . Trichomonas 09/10/10   Past Surgical History  Procedure Laterality Date  . Cesarean section  09/10/11    NRFHT - primary low transverse cesarean section    Family History  Problem Relation Age of Onset  . Hypertension Mother   . Hypertension Father      Exam    Uterus:     Pelvic Exam:    Perineum: No Hemorrhoids   Vulva: normal   Vagina:  normal mucosa   pH:    Cervix: anteverted, no bleeding following Pap and no cervical motion tenderness   Adnexa: normal adnexa and no mass, fullness, tenderness   Bony Pelvis: average  System: Breast:  normal appearance, no masses or tenderness, No nipple retraction or dimpling   Skin: normal coloration and turgor, no rashes    Neurologic: oriented, normal, negative, normal mood, gait normal; reflexes normal and symmetric   Extremities: normal strength, tone, and muscle mass, no deformities   HEENT extra ocular movement intact and sclera clear,  anicteric   Mouth/Teeth mucous membranes moist, pharynx normal without lesions   Neck supple and no masses   Cardiovascular: regular rate and rhythm, no murmurs or gallops   Respiratory:  appears well, vitals normal, no respiratory distress, acyanotic, normal RR, ear and throat exam is normal, neck free of mass or lymphadenopathy, chest clear, no wheezing, crepitations, rhonchi, normal symmetric air entry   Abdomen: soft, non-tender; bowel sounds normal; no masses,  no organomegaly   Urinary: urethral meatus normal      Assessment:    Pregnancy: G2P1001 Patient Active Problem List   Diagnosis Date Noted  . Heavy periods 09/11/2011        Plan:  [age].o. y/o G2P1001 at 1757w2deks by LMP, here for New OB visit.   Discussed with Patient: - All new OB labs ordered. (cbc, abo/RH,  GC/C, RPR, Rubella, HBsAg, HIV, Urine Cx) - Physiologic changes of pregnancy/ Safe meds in pregnancy/Diet modifications, [email or web address] Routine precautions (SAB, depression, infection s/s).  Patient provided with all pertinent phone numbers for emergencies. - Review Safety measures: seat belt and proper seatbelt application - Dating US [place]; Patient to schedule. -  Anatomy scan, declines quad screen - RTC in 4 weeks  for follow up. - Reviewed appropriate weight gain To Do: 1. Anatomy scan  [ ]  Vaccines: Flu:   Tdap:  [ ]  BCM: undecided  Edu: [x]  PTL precautions; [ ]  BF class; [ ]  childbirth class; [ ]   BF counseling

## 2013-04-07 NOTE — Addendum Note (Signed)
Addended by: Ernie Avena on: 04/07/2013 11:13 AM   Modules accepted: Orders

## 2013-04-08 LAB — OBSTETRIC PANEL
Basophils Absolute: 0 10*3/uL (ref 0.0–0.1)
Basophils Relative: 0 % (ref 0–1)
HCT: 31.3 % — ABNORMAL LOW (ref 36.0–46.0)
Lymphocytes Relative: 17 % (ref 12–46)
MCHC: 34.5 g/dL (ref 30.0–36.0)
Neutro Abs: 4.8 10*3/uL (ref 1.7–7.7)
Platelets: 327 10*3/uL (ref 150–400)
RBC: 3.79 MIL/uL — ABNORMAL LOW (ref 3.87–5.11)
RDW: 15.4 % (ref 11.5–15.5)
Rh Type: POSITIVE
Rubella: 6.24 Index — ABNORMAL HIGH (ref <0.90)
WBC: 6.2 10*3/uL (ref 4.0–10.5)

## 2013-04-08 LAB — HIV ANTIBODY (ROUTINE TESTING W REFLEX): HIV: NONREACTIVE

## 2013-04-09 LAB — HEMOGLOBINOPATHY EVALUATION
Hemoglobin Other: 0 %
Hgb A2 Quant: 2.4 % (ref 2.2–3.2)
Hgb A: 97.6 % (ref 96.8–97.8)
Hgb S Quant: 0 %

## 2013-04-09 LAB — PRESCRIPTION MONITORING PROFILE (19 PANEL)
Amphetamine/Meth: NEGATIVE ng/mL
Barbiturate Screen, Urine: NEGATIVE ng/mL
Benzodiazepine Screen, Urine: NEGATIVE ng/mL
Buprenorphine, Urine: NEGATIVE ng/mL
Carisoprodol, Urine: NEGATIVE ng/mL
Creatinine, Urine: 120.48 mg/dL (ref >20.0)
Methadone Screen, Urine: NEGATIVE ng/mL
Methaqualone: NEGATIVE ng/mL
Nitrites, Initial: NEGATIVE ug/mL
Oxycodone Screen, Ur: NEGATIVE ng/mL
Phencyclidine, Ur: NEGATIVE ng/mL
Propoxyphene: NEGATIVE ng/mL
Tapentadol, urine: NEGATIVE ng/mL
Zolpidem, Urine: NEGATIVE ng/mL

## 2013-04-09 LAB — CULTURE, OB URINE: Organism ID, Bacteria: NO GROWTH

## 2013-04-12 ENCOUNTER — Encounter: Payer: Self-pay | Admitting: *Deleted

## 2013-04-12 DIAGNOSIS — O34219 Maternal care for unspecified type scar from previous cesarean delivery: Secondary | ICD-10-CM | POA: Insufficient documentation

## 2013-04-13 ENCOUNTER — Encounter: Payer: Self-pay | Admitting: Family Medicine

## 2013-04-13 DIAGNOSIS — F1290 Cannabis use, unspecified, uncomplicated: Secondary | ICD-10-CM | POA: Insufficient documentation

## 2013-04-14 ENCOUNTER — Encounter: Payer: Self-pay | Admitting: *Deleted

## 2013-04-21 ENCOUNTER — Ambulatory Visit (HOSPITAL_COMMUNITY)
Admission: RE | Admit: 2013-04-21 | Discharge: 2013-04-21 | Disposition: A | Discharge status: Home / Self Care | Payer: Medicaid Other | Source: Ambulatory Visit | Attending: Family Medicine | Admitting: Family Medicine

## 2013-04-21 ENCOUNTER — Other Ambulatory Visit: Payer: Self-pay | Admitting: Family Medicine

## 2013-04-21 DIAGNOSIS — Z3689 Encounter for other specified antenatal screening: Secondary | ICD-10-CM | POA: Insufficient documentation

## 2013-04-21 DIAGNOSIS — Z3492 Encounter for supervision of normal pregnancy, unspecified, second trimester: Secondary | ICD-10-CM

## 2013-04-21 DIAGNOSIS — O99330 Smoking (tobacco) complicating pregnancy, unspecified trimester: Secondary | ICD-10-CM | POA: Insufficient documentation

## 2013-04-22 ENCOUNTER — Encounter: Payer: Self-pay | Admitting: Family Medicine

## 2013-05-05 ENCOUNTER — Encounter: Payer: Self-pay | Admitting: Advanced Practice Midwife

## 2013-05-05 ENCOUNTER — Ambulatory Visit (INDEPENDENT_AMBULATORY_CARE_PROVIDER_SITE_OTHER): Payer: Medicaid Other | Admitting: Advanced Practice Midwife

## 2013-05-05 VITALS — BP 109/66 | Wt 157.8 lb

## 2013-05-05 DIAGNOSIS — O34219 Maternal care for unspecified type scar from previous cesarean delivery: Secondary | ICD-10-CM

## 2013-05-05 DIAGNOSIS — Z1389 Encounter for screening for other disorder: Secondary | ICD-10-CM

## 2013-05-05 DIAGNOSIS — Z3490 Encounter for supervision of normal pregnancy, unspecified, unspecified trimester: Secondary | ICD-10-CM

## 2013-05-05 LAB — POCT URINALYSIS DIP (DEVICE)
Bilirubin Urine: NEGATIVE
Glucose, UA: NEGATIVE mg/dL
Hgb urine dipstick: NEGATIVE
Ketones, ur: NEGATIVE mg/dL
Nitrite: NEGATIVE
Specific Gravity, Urine: 1.02 (ref 1.005–1.030)
Urobilinogen, UA: 0.2 mg/dL (ref 0.0–1.0)
pH: 7 (ref 5.0–8.0)

## 2013-05-05 NOTE — Progress Notes (Signed)
Pulse 70

## 2013-05-05 NOTE — Patient Instructions (Signed)
Second Trimester of Pregnancy The second trimester is from week 13 through week 28, months 4 through 6. The second trimester is often a time when you feel your best. Your body has also adjusted to being pregnant, and you begin to feel better physically. Usually, morning sickness has lessened or quit completely, you may have more energy, and you may have an increase in appetite. The second trimester is also a time when the fetus is growing rapidly. At the end of the sixth month, the fetus is about 9 inches long and weighs about 1 pounds. You will likely begin to feel the baby move (quickening) between 18 and 20 weeks of the pregnancy. BODY CHANGES Your body goes through many changes during pregnancy. The changes vary from woman to woman.   Your weight will continue to increase. You will notice your lower abdomen bulging out.  You may begin to get stretch marks on your hips, abdomen, and breasts.  You may develop headaches that can be relieved by medicines approved by your caregiver.  You may urinate more often because the fetus is pressing on your bladder.  You may develop or continue to have heartburn as a result of your pregnancy.  You may develop constipation because certain hormones are causing the muscles that push waste through your intestines to slow down.  You may develop hemorrhoids or swollen, bulging veins (varicose veins).  You may have back pain because of the weight gain and pregnancy hormones relaxing your joints between the bones in your pelvis and as a result of a shift in weight and the muscles that support your balance.  Your breasts will continue to grow and be tender.  Your gums may bleed and may be sensitive to brushing and flossing.  Dark spots or blotches (chloasma, mask of pregnancy) may develop on your face. This will likely fade after the baby is born.  A dark line from your belly button to the pubic area (linea nigra) may appear. This will likely fade after the  baby is born. WHAT TO EXPECT AT YOUR PRENATAL VISITS During a routine prenatal visit:  You will be weighed to make sure you and the fetus are growing normally.  Your blood pressure will be taken.  Your abdomen will be measured to track your baby's growth.  The fetal heartbeat will be listened to.  Any test results from the previous visit will be discussed. Your caregiver may ask you:  How you are feeling.  If you are feeling the baby move.  If you have had any abnormal symptoms, such as leaking fluid, bleeding, severe headaches, or abdominal cramping.  If you have any questions. Other tests that may be performed during your second trimester include:  Blood tests that check for:  Low iron levels (anemia).  Gestational diabetes (between 24 and 28 weeks).  Rh antibodies.  Urine tests to check for infections, diabetes, or protein in the urine.  An ultrasound to confirm the proper growth and development of the baby.  An amniocentesis to check for possible genetic problems.  Fetal screens for spina bifida and Down syndrome. HOME CARE INSTRUCTIONS   Avoid all smoking, herbs, alcohol, and unprescribed drugs. These chemicals affect the formation and growth of the baby.  Follow your caregiver's instructions regarding medicine use. There are medicines that are either safe or unsafe to take during pregnancy.  Exercise only as directed by your caregiver. Experiencing uterine cramps is a good sign to stop exercising.  Continue to eat regular,   healthy meals.  Wear a good support bra for breast tenderness.  Do not use hot tubs, steam rooms, or saunas.  Wear your seat belt at all times when driving.  Avoid raw meat, uncooked cheese, cat litter boxes, and soil used by cats. These carry germs that can cause birth defects in the baby.  Take your prenatal vitamins.  Try taking a stool softener (if your caregiver approves) if you develop constipation. Eat more high-fiber foods,  such as fresh vegetables or fruit and whole grains. Drink plenty of fluids to keep your urine clear or pale yellow.  Take warm sitz baths to soothe any pain or discomfort caused by hemorrhoids. Use hemorrhoid cream if your caregiver approves.  If you develop varicose veins, wear support hose. Elevate your feet for 15 minutes, 3 4 times a day. Limit salt in your diet.  Avoid heavy lifting, wear low heel shoes, and practice good posture.  Rest with your legs elevated if you have leg cramps or low back pain.  Visit your dentist if you have not gone yet during your pregnancy. Use a soft toothbrush to brush your teeth and be gentle when you floss.  A sexual relationship may be continued unless your caregiver directs you otherwise.  Continue to go to all your prenatal visits as directed by your caregiver. SEEK MEDICAL CARE IF:   You have dizziness.  You have mild pelvic cramps, pelvic pressure, or nagging pain in the abdominal area.  You have persistent nausea, vomiting, or diarrhea.  You have a bad smelling vaginal discharge.  You have pain with urination. SEEK IMMEDIATE MEDICAL CARE IF:   You have a fever.  You are leaking fluid from your vagina.  You have spotting or bleeding from your vagina.  You have severe abdominal cramping or pain.  You have rapid weight gain or loss.  You have shortness of breath with chest pain.  You notice sudden or extreme swelling of your face, hands, ankles, feet, or legs.  You have not felt your baby move in over an hour.  You have severe headaches that do not go away with medicine.  You have vision changes. Document Released: 05/21/2001 Document Revised: 01/27/2013 Document Reviewed: 07/28/2012 [employer] Patient Information 2014 [employer].  

## 2013-05-05 NOTE — Progress Notes (Signed)
Doing well. Will schedule followup US for completion of anatomy scan.

## 2013-05-24 ENCOUNTER — Ambulatory Visit (HOSPITAL_COMMUNITY)
Admission: RE | Admit: 2013-05-24 | Discharge: 2013-05-24 | Disposition: A | Discharge status: Home / Self Care | Payer: Medicaid Other | Source: Ambulatory Visit | Attending: Advanced Practice Midwife | Admitting: Advanced Practice Midwife

## 2013-05-24 ENCOUNTER — Encounter: Payer: Self-pay | Admitting: Advanced Practice Midwife

## 2013-05-24 DIAGNOSIS — Z3689 Encounter for other specified antenatal screening: Secondary | ICD-10-CM | POA: Insufficient documentation

## 2013-05-24 DIAGNOSIS — Z3490 Encounter for supervision of normal pregnancy, unspecified, unspecified trimester: Secondary | ICD-10-CM

## 2013-06-02 ENCOUNTER — Encounter: Payer: Self-pay | Admitting: Obstetrics & Gynecology

## 2013-06-08 ENCOUNTER — Inpatient Hospital Stay (HOSPITAL_COMMUNITY)
Admission: AD | Admit: 2013-06-08 | Discharge: 2013-06-08 | Disposition: A | Discharge status: Home / Self Care | Payer: Medicaid Other | Source: Ambulatory Visit | Attending: Obstetrics and Gynecology | Admitting: Obstetrics and Gynecology

## 2013-06-08 ENCOUNTER — Encounter (HOSPITAL_COMMUNITY): Payer: Self-pay

## 2013-06-08 DIAGNOSIS — O99330 Smoking (tobacco) complicating pregnancy, unspecified trimester: Secondary | ICD-10-CM | POA: Insufficient documentation

## 2013-06-08 DIAGNOSIS — A499 Bacterial infection, unspecified: Secondary | ICD-10-CM | POA: Insufficient documentation

## 2013-06-08 DIAGNOSIS — O09299 Supervision of pregnancy with other poor reproductive or obstetric history, unspecified trimester: Secondary | ICD-10-CM

## 2013-06-08 DIAGNOSIS — O34219 Maternal care for unspecified type scar from previous cesarean delivery: Secondary | ICD-10-CM | POA: Insufficient documentation

## 2013-06-08 DIAGNOSIS — R197 Diarrhea, unspecified: Secondary | ICD-10-CM | POA: Insufficient documentation

## 2013-06-08 DIAGNOSIS — K580 Irritable bowel syndrome with diarrhea: Secondary | ICD-10-CM | POA: Diagnosis present

## 2013-06-08 DIAGNOSIS — O2390 Unspecified genitourinary tract infection in pregnancy, unspecified trimester: Secondary | ICD-10-CM | POA: Insufficient documentation

## 2013-06-08 DIAGNOSIS — B9689 Other specified bacterial agents as the cause of diseases classified elsewhere: Secondary | ICD-10-CM | POA: Insufficient documentation

## 2013-06-08 DIAGNOSIS — F1290 Cannabis use, unspecified, uncomplicated: Secondary | ICD-10-CM

## 2013-06-08 DIAGNOSIS — F17200 Nicotine dependence, unspecified, uncomplicated: Secondary | ICD-10-CM | POA: Diagnosis present

## 2013-06-08 DIAGNOSIS — K589 Irritable bowel syndrome without diarrhea: Secondary | ICD-10-CM | POA: Insufficient documentation

## 2013-06-08 DIAGNOSIS — N760 Acute vaginitis: Secondary | ICD-10-CM | POA: Insufficient documentation

## 2013-06-08 LAB — URINALYSIS, ROUTINE W REFLEX MICROSCOPIC
Bilirubin Urine: NEGATIVE
Hgb urine dipstick: NEGATIVE
Protein, ur: NEGATIVE mg/dL
Specific Gravity, Urine: 1.015 (ref 1.005–1.030)
Urobilinogen, UA: 1 mg/dL (ref 0.0–1.0)
pH: 8 (ref 5.0–8.0)

## 2013-06-08 LAB — WET PREP, GENITAL
Trich, Wet Prep: NONE SEEN
Yeast Wet Prep HPF POC: NONE SEEN

## 2013-06-08 MED ORDER — METRONIDAZOLE 500 MG PO TABS: 500.0000 mg | ORAL_TABLET | Freq: Two times a day (BID) | ORAL | Status: AC

## 2013-06-08 NOTE — MAU Note (Signed)
Patient is in with c/o "body feeling heavy", intermittent cramping, odorous vaginal discharge. She states she had gi virus on Saturday, she is feeling better now. She denies vaginal bleeding or LOF. She reports good fetal movement.

## 2013-06-08 NOTE — MAU Note (Addendum)
Feel real heavy today, had diarrhea today.  Has IBS.  Had 24 virus on Saturday.  Vag odor. Feel fatigue, feels out of it.  No pain, just tightness in her belly

## 2013-06-08 NOTE — MAU Provider Note (Signed)
History   22 yo G2P1001 at 34 1/7 weeks presented unannounced c/o "feeling heavy"--had diarrhea today, but has IBS.  Reports had 24 hour virus on Saturday, but is now able to keep down f/f without nausea or vomiting.  Son had same issues.  Some mild cramping with diarrhea, denies leaking or bleeding, but feels she has "foul odor" of vagina.  Has NOB w/u visit at [facility] scheduled 06/14/13.  Patient Active Problem List   Diagnosis Date Noted  . Smoker 06/08/2013  . Hx of preeclampsia, prior pregnancy, currently pregnant 06/08/2013  . IBS (irritable bowel syndrome) 06/08/2013  . Uses marijuana 04/13/2013  . Previous cesarean delivery, antepartum condition or complication 90/24/0973  . Heavy periods 09/11/2011     Chief Complaint  Patient presents with  . Fatigue  . Diarrhea   HPI:  See above  OB History   Grav Para Term Preterm Abortions TAB SAB Ect Mult Living   2 1 1  0 0 0 0 0 0 1      Past Medical History  Diagnosis Date  . IBS (irritable bowel syndrome)   . H/O varicella 09/10/11  . Low iron 09/10/11  . Yeast infection 09/10/11  . Bacterial infection 09/10/11  . Trichomonas 09/10/10    Past Surgical History  Procedure Laterality Date  . Cesarean section  09/10/11    NRFHT - primary low transverse cesarean section     Family History  Problem Relation Age of Onset  . Hypertension Mother   . Hypertension Father     History  Substance Use Topics  . Smoking status: Current Every Day Smoker -- 0.25 packs/day for 8 years    Types: Cigarettes  . Smokeless tobacco: Never Used  . Alcohol Use: No    Allergies:  Allergies  Allergen Reactions  . Lactose Intolerance (Gi) Diarrhea and Nausea And Vomiting    No prescriptions prior to admission    ROS:  Vaginal odor, "heavy feeling in body", +FM Physical Exam   Blood pressure 116/63, pulse 73, temperature 98.9 F (37.2 C), temperature source Oral, resp. rate 18, height 5' 1.5" (1.562 m), weight 163 lb (73.936 kg), last menstrual  period 12/07/2012.  Physical Exam Chest clear Heart RRR without murmur Abd gravid, NT Pelvic--cervix closed, long Ext WNL  FHR Category 1 No UCs.  Results for orders placed during the hospital encounter of 06/08/13 (from the past 24 hour(s))  URINALYSIS, ROUTINE W REFLEX MICROSCOPIC     Status: Abnormal   Collection Time    06/08/13  6:35 PM      Result Value Range   Color, Urine AMBER (*) YELLOW   APPearance CLEAR  CLEAR   Specific Gravity, Urine 1.015  1.005 - 1.030   pH 8.0  5.0 - 8.0   Glucose, UA NEGATIVE  NEGATIVE mg/dL   Hgb urine dipstick NEGATIVE  NEGATIVE   Bilirubin Urine NEGATIVE  NEGATIVE   Ketones, ur 15 (*) NEGATIVE mg/dL   Protein, ur NEGATIVE  NEGATIVE mg/dL   Urobilinogen, UA 1.0  0.0 - 1.0 mg/dL   Nitrite NEGATIVE  NEGATIVE   Leukocytes, UA NEGATIVE  NEGATIVE  WET PREP, GENITAL     Status: Abnormal   Collection Time    06/08/13  8:50 PM      Result Value Range   Yeast Wet Prep HPF POC NONE SEEN  NONE SEEN   Trich, Wet Prep NONE SEEN  NONE SEEN   Clue Cells Wet Prep HPF POC FEW (*) NONE SEEN  WBC, Wet Prep HPF POC FEW (*) NONE SEEN     ED Course  IUP at 26 1/7 weeks BV IBS Recent viral gastroenteritis, now resolved.  Plan: D/C home Rx Metronidazole 500 mg po BID x 7 days Encouraged to f/u with GI MD regarding IBS--may need to restart probiotic or other meds for IBS. Keep scheduled appt on Monday for NOB w/u. Call prn.   Donnel Saxon CNM, [place] 06/08/2013 10:46 PM

## 2013-06-08 NOTE — Progress Notes (Signed)
Notified of patient. Will come to MAU to see patient.

## 2013-06-09 LAB — CULTURE, BETA STREP (GROUP B ONLY)

## 2013-06-09 LAB — GC/CHLAMYDIA PROBE AMP
CT Probe RNA: NEGATIVE
GC Probe RNA: NEGATIVE

## 2013-06-12 ENCOUNTER — Encounter: Payer: Self-pay | Admitting: Advanced Practice Midwife

## 2013-06-12 DIAGNOSIS — O99820 Streptococcus B carrier state complicating pregnancy: Secondary | ICD-10-CM | POA: Insufficient documentation

## 2013-07-03 ENCOUNTER — Encounter (HOSPITAL_COMMUNITY): Payer: Self-pay

## 2013-07-03 ENCOUNTER — Inpatient Hospital Stay (HOSPITAL_COMMUNITY)
Admission: AD | Admit: 2013-07-03 | Discharge: 2013-07-03 | Disposition: A | Discharge status: Home / Self Care | Payer: Medicaid Other | Source: Ambulatory Visit | Attending: Obstetrics and Gynecology | Admitting: Obstetrics and Gynecology

## 2013-07-03 DIAGNOSIS — O9989 Other specified diseases and conditions complicating pregnancy, childbirth and the puerperium: Principal | ICD-10-CM

## 2013-07-03 DIAGNOSIS — R197 Diarrhea, unspecified: Secondary | ICD-10-CM | POA: Insufficient documentation

## 2013-07-03 DIAGNOSIS — F1290 Cannabis use, unspecified, uncomplicated: Secondary | ICD-10-CM

## 2013-07-03 DIAGNOSIS — O212 Late vomiting of pregnancy: Secondary | ICD-10-CM | POA: Insufficient documentation

## 2013-07-03 DIAGNOSIS — R42 Dizziness and giddiness: Secondary | ICD-10-CM | POA: Insufficient documentation

## 2013-07-03 DIAGNOSIS — O34219 Maternal care for unspecified type scar from previous cesarean delivery: Secondary | ICD-10-CM

## 2013-07-03 DIAGNOSIS — O99891 Other specified diseases and conditions complicating pregnancy: Secondary | ICD-10-CM | POA: Insufficient documentation

## 2013-07-03 DIAGNOSIS — K589 Irritable bowel syndrome without diarrhea: Secondary | ICD-10-CM | POA: Insufficient documentation

## 2013-07-03 DIAGNOSIS — O99820 Streptococcus B carrier state complicating pregnancy: Secondary | ICD-10-CM

## 2013-07-03 HISTORY — DX: Hypotension, unspecified: I95.9

## 2013-07-03 LAB — URINALYSIS, ROUTINE W REFLEX MICROSCOPIC
Bilirubin Urine: NEGATIVE
GLUCOSE, UA: NEGATIVE mg/dL
HGB URINE DIPSTICK: NEGATIVE
Ketones, ur: NEGATIVE mg/dL
Leukocytes, UA: NEGATIVE
Nitrite: NEGATIVE
PH: 6 (ref 5.0–8.0)
Protein, ur: NEGATIVE mg/dL
SPECIFIC GRAVITY, URINE: 1.02 (ref 1.005–1.030)
UROBILINOGEN UA: 0.2 mg/dL (ref 0.0–1.0)

## 2013-07-03 LAB — COMPREHENSIVE METABOLIC PANEL
ALT: 14 U/L (ref 0–35)
AST: 14 U/L (ref 0–37)
Albumin: 2.2 g/dL — ABNORMAL LOW (ref 3.5–5.2)
Alkaline Phosphatase: 104 U/L (ref 39–117)
BILIRUBIN TOTAL: 0.2 mg/dL — AB (ref 0.3–1.2)
BUN: 6 mg/dL (ref 6–23)
CHLORIDE: 101 meq/L (ref 96–112)
CO2: 24 meq/L (ref 19–32)
Calcium: 9.1 mg/dL (ref 8.4–10.5)
Creatinine, Ser: 0.65 mg/dL (ref 0.50–1.10)
GFR calc Af Amer: >90 mL/min (ref >90)
Glucose, Bld: 80 mg/dL (ref 70–99)
Potassium: 4.1 mEq/L (ref 3.7–5.3)
Sodium: 137 mEq/L (ref 137–147)
Total Protein: 6.2 g/dL (ref 6.0–8.3)

## 2013-07-03 LAB — CBC
HCT: 31.1 % — ABNORMAL LOW (ref 36.0–46.0)
HEMOGLOBIN: 10.3 g/dL — AB (ref 12.0–15.0)
MCH: 28 pg (ref 26.0–34.0)
MCHC: 33.1 g/dL (ref 30.0–36.0)
MCV: 84.5 fL (ref 78.0–100.0)
Platelets: 284 10*3/uL (ref 150–400)
RBC: 3.68 MIL/uL — AB (ref 3.87–5.11)
RDW: 13.4 % (ref 11.5–15.5)
WBC: 6.8 10*3/uL (ref 4.0–10.5)

## 2013-07-03 MED ORDER — LACTATED RINGERS IV BOLUS (SEPSIS)
1000.0000 mL | Freq: Once | INTRAVENOUS | Status: AC
  Administered 2013-07-03: 1000 mL via INTRAVENOUS

## 2013-07-03 NOTE — Discharge Instructions (Signed)
You can take immodium if diarrhea persists.  You can take floridex, the liquid iron supplement to help with your anemia. Take 1 tablespoon at the opposite time you take your prenatal vitamin.  You can also choose foods that are high in iron.  You need to stay well hydrated, drink at least 64oz of water daily.  Change positions slowly to help with dizziness.  Iron-Rich Diet An iron-rich diet contains foods that are good sources of iron. Iron is an important mineral that helps your body produce hemoglobin. Hemoglobin is a protein in red blood cells that carries oxygen to the body's tissues. Sometimes, the iron level in your blood can be low. This may be caused by:  A lack of iron in your diet.  Blood loss.  Times of growth, such as during pregnancy or during a child's growth and development. Low levels of iron can cause a decrease in the number of red blood cells. This can result in iron deficiency anemia. Iron deficiency anemia symptoms include:  Tiredness.  Weakness.  Irritability.  Increased chance of infection. Here are some recommendations for daily iron intake:  Males older than 25 years of age need 8 mg of iron per day.  Women ages 30 to 10 need 18 mg of iron per day.  Pregnant women need 27 mg of iron per day, and women who are over 13 years of age and breastfeeding need 9 mg of iron per day.  Women over the age of 52 need 8 mg of iron per day. SOURCES OF IRON There are 2 types of iron that are found in food: heme iron and nonheme iron. Heme iron is absorbed by the body better than nonheme iron. Heme iron is found in meat, poultry, and fish. Nonheme iron is found in grains, beans, and vegetables. Heme Iron Sources Food / Iron (mg)  Chicken liver, 3 oz (85 g)/ 10 mg  Beef liver, 3 oz (85 g)/ 5.5 mg  Oysters, 3 oz (85 g)/ 8 mg  Beef, 3 oz (85 g)/ 2 to 3 mg  Shrimp, 3 oz (85 g)/ 2.8 mg  [place], 3 oz (85 g)/ 2 mg  Chicken, 3 oz (85 g) / 1 mg  Fish (tuna, halibut),  3 oz (85 g)/ 1 mg  Pork, 3 oz (85 g)/ 0.9 mg Nonheme Iron Sources Food / Iron (mg)  Ready-to-eat breakfast cereal, iron-fortified / 3.9 to 7 mg  Tofu,  cup / 3.4 mg  Kidney beans,  cup / 2.6 mg  Baked potato with skin / 2.7 mg  Asparagus,  cup / 2.2 mg  Avocado / 2 mg  Dried peaches,  cup / 1.6 mg  Raisins,  cup / 1.5 mg  Soy milk, 1 cup / 1.5 mg  Whole-wheat bread, 1 slice / 1.2 mg  Spinach, 1 cup / 0.8 mg  Broccoli,  cup / 0.6 mg IRON ABSORPTION Certain foods can decrease the body's absorption of iron. Try to avoid these foods and beverages while eating meals with iron-containing foods:  Coffee.  Tea.  Fiber.  Soy. Foods containing vitamin C can help increase the amount of iron your body absorbs from iron sources, especially from nonheme sources. Eat foods with vitamin C along with iron-containing foods to increase your iron absorption. Foods that are high in vitamin C include many fruits and vegetables. Some good sources are:  Fresh orange juice.  Oranges.  Strawberries.  Mangoes.  Grapefruit.  Red bell peppers.  Green bell peppers.  Broccoli.  Potatoes with skin.  Tomato juice. Document Released: 01/08/2005 Document Revised: 08/19/2011 Document Reviewed: 11/15/2010 [facility] Patient Information 2014 [place], [place].      Pregnancy and Anemia Anemia is a condition in which the concentration of red blood cells or hemoglobin in the blood is below normal. Hemoglobin is a substance in red blood cells that carries oxygen to the tissues of the body. Anemia results in not enough oxygen reaching these tissues.  Anemia during pregnancy is common because the fetus uses more iron and folic acid as it is developing. Your body may not produce enough red blood cells because of this. Also, during pregnancy, the liquid part of the blood (plasma) increases by about 50%, and the red blood cells increase by only 25%. This lowers the concentration of the red  blood cells and creates a natural anemia-like situation.  CAUSES  The most common cause of anemia during pregnancy is not having enough iron in the body to make red blood cells (iron deficiency anemia). Other causes may include:  Folic acid deficiency.  Vitamin B12 deficiency.  Certain prescription or over-the-counter medicines.  Certain medical conditions or infections that destroy red blood cells.  A low platelet count and bleeding caused by antibodies that go through the placenta to the fetus from the mother's blood. SIGNS AND SYMPTOMS  Mild anemia may not be noticeable. If it becomes severe, symptoms may include:  Tiredness.  Shortness of breath, especially with exercise.  Weakness.  Fainting.  Pale looking skin.  Headaches.  Feeling a fast or irregular heartbeat (palpitations). DIAGNOSIS  The type of anemia is usually diagnosed from your family and medical history and blood tests. TREATMENT  Treatment of anemia during pregnancy depends on the cause of the anemia. Treatment can include:  Supplements of iron, vitamin U72, or folic acid.  A blood transfusion. This may be needed if blood loss is severe.  Hospitalization. This may be needed if there is significant continual blood loss.  Dietary changes. HOME CARE INSTRUCTIONS   Follow your dietitian's or health care provider's dietary recommendations.  Increase your vitamin C intake. This will help the stomach absorb more iron.  Eat a diet rich in iron. This would include foods such as:  Liver.  Beef.  Whole grain bread.  Eggs.  Dried fruit.  Take iron and vitamins as directed by your health care provider.  Eat green leafy vegetables. These are a good source of folic acid. SEEK MEDICAL CARE IF:   You have frequent or lasting headaches.  You are looking pale.  You are bruising easily. SEEK IMMEDIATE MEDICAL CARE IF:   You have extreme weakness, shortness of breath, or chest pain.  You become  dizzy or have trouble concentrating.  You have heavy vaginal bleeding.  You develop a rash.  You have bloody or black, tarry stools.  You faint.  You vomit up blood.  You vomit repeatedly.  You have abdominal pain.  You have a fever or persistent symptoms for more than 2 3 days.  You have a fever and your symptoms suddenly get worse.  You are dehydrated. MAKE SURE YOU:   Understand these instructions.  Will watch your condition.  Will get help right away if you are not doing well or get worse. Document Released: 05/24/2000 Document Revised: 03/17/2013 Document Reviewed: 01/06/2013 [facility] Patient Information 2014 [place].     Fetal Movement Counts Patient Name: __________________________________________________ Patient Due Date: ____________________ Performing a fetal movement count is highly recommended in high-risk pregnancies,  but it is good for every pregnant woman to do. Your caregiver may ask you to start counting fetal movements at 28 weeks of the pregnancy. Fetal movements often increase:  After eating a full meal.  After physical activity.  After eating or drinking something sweet or cold.  At rest. Pay attention to when you feel the baby is most active. This will help you notice a pattern of your baby's sleep and wake cycles and what factors contribute to an increase in fetal movement. It is important to perform a fetal movement count at the same time each day when your baby is normally most active.  HOW TO COUNT FETAL MOVEMENTS 1. Find a quiet and comfortable area to sit or lie down on your left side. Lying on your left side provides the best blood and oxygen circulation to your baby. 2. Write down the day and time on a sheet of paper or in a journal. 3. Start counting kicks, flutters, swishes, rolls, or jabs in a 2 hour period. You should feel at least 10 movements within 2 hours. 4. If you do not feel 10 movements in 2 hours, wait 2 3 hours and  count again. Look for a change in the pattern or not enough counts in 2 hours. SEEK MEDICAL CARE IF:  You feel less than 10 counts in 2 hours, tried twice.  There is no movement in over an hour.  The pattern is changing or taking longer each day to reach 10 counts in 2 hours.  You feel the baby is not moving as he or she usually does. Date: ____________ Movements: ____________ Start time: ____________ Sharon Clayton time: ____________  Date: ____________ Movements: ____________ Start time: ____________ Sharon Clayton time: ____________ Date: ____________ Movements: ____________ Start time: ____________ Sharon Clayton time: ____________ Date: ____________ Movements: ____________ Start time: ____________ Sharon Clayton time: ____________ Date: ____________ Movements: ____________ Start time: ____________ Sharon Clayton time: ____________ Date: ____________ Movements: ____________ Start time: ____________ Sharon Clayton time: ____________ Date: ____________ Movements: ____________ Start time: ____________ Sharon Clayton time: ____________ Date: ____________ Movements: ____________ Start time: ____________ Sharon Clayton time: ____________  Date: ____________ Movements: ____________ Start time: ____________ Sharon Clayton time: ____________ Date: ____________ Movements: ____________ Start time: ____________ Sharon Clayton time: ____________ Date: ____________ Movements: ____________ Start time: ____________ Sharon Clayton time: ____________ Date: ____________ Movements: ____________ Start time: ____________ Sharon Clayton time: ____________ Date: ____________ Movements: ____________ Start time: ____________ Sharon Clayton time: ____________ Date: ____________ Movements: ____________ Start time: ____________ Sharon Clayton time: ____________ Date: ____________ Movements: ____________ Start time: ____________ Sharon Clayton time: ____________  Date: ____________ Movements: ____________ Start time: ____________ Sharon Clayton time: ____________ Date: ____________ Movements: ____________ Start time: ____________ Sharon Clayton  time: ____________ Date: ____________ Movements: ____________ Start time: ____________ Sharon Clayton time: ____________ Date: ____________ Movements: ____________ Start time: ____________ Sharon Clayton time: ____________ Date: ____________ Movements: ____________ Start time: ____________ Sharon Clayton time: ____________ Date: ____________ Movements: ____________ Start time: ____________ Sharon Clayton time: ____________ Date: ____________ Movements: ____________ Start time: ____________ Sharon Clayton time: ____________  Date: ____________ Movements: ____________ Start time: ____________ Sharon Clayton time: ____________ Date: ____________ Movements: ____________ Start time: ____________ Sharon Clayton time: ____________ Date: ____________ Movements: ____________ Start time: ____________ Sharon Clayton time: ____________ Date: ____________ Movements: ____________ Start time: ____________ Sharon Clayton time: ____________ Date: ____________ Movements: ____________ Start time: ____________ Sharon Clayton time: ____________ Date: ____________ Movements: ____________ Start time: ____________ Sharon Clayton time: ____________ Date: ____________ Movements: ____________ Start time: ____________ Sharon Clayton time: ____________  Date: ____________ Movements: ____________ Start time: ____________ Sharon Clayton time: ____________ Date: ____________ Movements: ____________ Start time: ____________ Sharon Clayton time: ____________ Date: ____________ Movements: ____________ Start time: ____________ Sharon Clayton time:  ____________ Date: ____________ Movements: ____________ Start time: ____________ Sharon Clayton time: ____________ Date: ____________ Movements: ____________ Start time: ____________ Sharon Clayton time: ____________ Date: ____________ Movements: ____________ Start time: ____________ Sharon Clayton time: ____________ Date: ____________ Movements: ____________ Start time: ____________ Sharon Clayton time: ____________  Date: ____________ Movements: ____________ Start time: ____________ Sharon Clayton time: ____________ Date: ____________  Movements: ____________ Start time: ____________ Sharon Clayton time: ____________ Date: ____________ Movements: ____________ Start time: ____________ Sharon Clayton time: ____________ Date: ____________ Movements: ____________ Start time: ____________ Sharon Clayton time: ____________ Date: ____________ Movements: ____________ Start time: ____________ Sharon Clayton time: ____________ Date: ____________ Movements: ____________ Start time: ____________ Sharon Clayton time: ____________ Date: ____________ Movements: ____________ Start time: ____________ Sharon Clayton time: ____________  Date: ____________ Movements: ____________ Start time: ____________ Sharon Clayton time: ____________ Date: ____________ Movements: ____________ Start time: ____________ Sharon Clayton time: ____________ Date: ____________ Movements: ____________ Start time: ____________ Sharon Clayton time: ____________ Date: ____________ Movements: ____________ Start time: ____________ Sharon Clayton time: ____________ Date: ____________ Movements: ____________ Start time: ____________ Sharon Clayton time: ____________ Date: ____________ Movements: ____________ Start time: ____________ Sharon Clayton time: ____________ Date: ____________ Movements: ____________ Start time: ____________ Sharon Clayton time: ____________  Date: ____________ Movements: ____________ Start time: ____________ Sharon Clayton time: ____________ Date: ____________ Movements: ____________ Start time: ____________ Sharon Clayton time: ____________ Date: ____________ Movements: ____________ Start time: ____________ Sharon Clayton time: ____________ Date: ____________ Movements: ____________ Start time: ____________ Sharon Clayton time: ____________ Date: ____________ Movements: ____________ Start time: ____________ Sharon Clayton time: ____________ Date: ____________ Movements: ____________ Start time: ____________ Sharon Clayton time: ____________ Document Released: 06/26/2006 Document Revised: 05/13/2012 Document Reviewed: 03/23/2012 [employer] Patient Information 2014 [place].

## 2013-07-03 NOTE — MAU Note (Addendum)
Reports she has IBS x 4 years.  Yesterday had 3 episodes of diarrhea.  Felt dizzy and nauseated today.  States no fever that she knows of.  States IBS is worse with pregnancy and has been having dizzy spells 2 to 3 times a week with diarrhea from her IBS and saw her doctor Thursday.   Last BM was yesterday.

## 2013-08-06 ENCOUNTER — Encounter (HOSPITAL_COMMUNITY): Payer: Self-pay

## 2013-08-06 ENCOUNTER — Inpatient Hospital Stay (HOSPITAL_COMMUNITY)
Admission: AD | Admit: 2013-08-06 | Discharge: 2013-08-07 | Disposition: A | Discharge status: Home / Self Care | Payer: Medicaid Other | Source: Ambulatory Visit | Attending: Obstetrics and Gynecology | Admitting: Obstetrics and Gynecology

## 2013-08-06 DIAGNOSIS — O212 Late vomiting of pregnancy: Secondary | ICD-10-CM | POA: Insufficient documentation

## 2013-08-06 DIAGNOSIS — R197 Diarrhea, unspecified: Secondary | ICD-10-CM | POA: Insufficient documentation

## 2013-08-06 DIAGNOSIS — O99330 Smoking (tobacco) complicating pregnancy, unspecified trimester: Secondary | ICD-10-CM | POA: Insufficient documentation

## 2013-08-06 LAB — URINALYSIS, ROUTINE W REFLEX MICROSCOPIC
BILIRUBIN URINE: NEGATIVE
Glucose, UA: NEGATIVE mg/dL
Hgb urine dipstick: NEGATIVE
Ketones, ur: 15 mg/dL — AB
Leukocytes, UA: NEGATIVE
Nitrite: NEGATIVE
PROTEIN: NEGATIVE mg/dL
Specific Gravity, Urine: 1.02 (ref 1.005–1.030)
UROBILINOGEN UA: 0.2 mg/dL (ref 0.0–1.0)
pH: 6.5 (ref 5.0–8.0)

## 2013-08-06 MED ORDER — ONDANSETRON HCL 4 MG/2ML IJ SOLN
4.0000 mg | Freq: Once | INTRAMUSCULAR | Status: AC
Start: 2013-08-07 — End: 2013-08-07
  Administered 2013-08-07: 4 mg via INTRAVENOUS
  Filled 2013-08-06: qty 2

## 2013-08-06 MED ORDER — DEXTROSE 5 % IN LACTATED RINGERS IV BOLUS
500.0000 mL | Freq: Once | INTRAVENOUS | Status: AC
  Administered 2013-08-07: 1000 mL via INTRAVENOUS

## 2013-08-06 NOTE — MAU Note (Signed)
I have severe IBS. Had diarrhea few times yesterday and have vomited few times. I think I may be dehydrated. Symptoms feel like my IBS symptoms

## 2013-08-07 LAB — CBC WITH DIFFERENTIAL/PLATELET
BASOS ABS: 0 10*3/uL (ref 0.0–0.1)
Basophils Relative: 0 % (ref 0–1)
EOS ABS: 0.1 10*3/uL (ref 0.0–0.7)
EOS PCT: 1 % (ref 0–5)
HCT: 30 % — ABNORMAL LOW (ref 36.0–46.0)
Hemoglobin: 10.2 g/dL — ABNORMAL LOW (ref 12.0–15.0)
Lymphocytes Relative: 22 % (ref 12–46)
Lymphs Abs: 1.5 10*3/uL (ref 0.7–4.0)
MCH: 28.1 pg (ref 26.0–34.0)
MCHC: 34 g/dL (ref 30.0–36.0)
MCV: 82.6 fL (ref 78.0–100.0)
Monocytes Absolute: 0.5 10*3/uL (ref 0.1–1.0)
Monocytes Relative: 8 % (ref 3–12)
Neutro Abs: 4.6 10*3/uL (ref 1.7–7.7)
Neutrophils Relative %: 69 % (ref 43–77)
PLATELETS: 286 10*3/uL (ref 150–400)
RBC: 3.63 MIL/uL — ABNORMAL LOW (ref 3.87–5.11)
RDW: 13.4 % (ref 11.5–15.5)
WBC: 6.7 10*3/uL (ref 4.0–10.5)

## 2013-08-07 LAB — URINALYSIS, ROUTINE W REFLEX MICROSCOPIC
BILIRUBIN URINE: NEGATIVE
GLUCOSE, UA: NEGATIVE mg/dL
HGB URINE DIPSTICK: NEGATIVE
KETONES UR: NEGATIVE mg/dL
Leukocytes, UA: NEGATIVE
NITRITE: NEGATIVE
PH: 6 (ref 5.0–8.0)
Protein, ur: NEGATIVE mg/dL
SPECIFIC GRAVITY, URINE: 1.02 (ref 1.005–1.030)
Urobilinogen, UA: 0.2 mg/dL (ref 0.0–1.0)

## 2013-08-07 LAB — COMPREHENSIVE METABOLIC PANEL
ALBUMIN: 2.3 g/dL — AB (ref 3.5–5.2)
ALK PHOS: 137 U/L — AB (ref 39–117)
ALT: 19 U/L (ref 0–35)
AST: 18 U/L (ref 0–37)
BUN: 8 mg/dL (ref 6–23)
CALCIUM: 8.8 mg/dL (ref 8.4–10.5)
CO2: 21 mEq/L (ref 19–32)
Chloride: 104 mEq/L (ref 96–112)
Creatinine, Ser: 0.7 mg/dL (ref 0.50–1.10)
GFR calc Af Amer: >90 mL/min (ref >90)
GFR calc non Af Amer: >90 mL/min (ref >90)
Glucose, Bld: 76 mg/dL (ref 70–99)
Potassium: 4.2 mEq/L (ref 3.7–5.3)
SODIUM: 139 meq/L (ref 137–147)
TOTAL PROTEIN: 6.6 g/dL (ref 6.0–8.3)
Total Bilirubin: <0.2 mg/dL — ABNORMAL LOW (ref 0.3–1.2)

## 2013-08-07 NOTE — MAU Provider Note (Signed)
History     CSN: [id number]  Arrival date and time: 08/06/13 2312   First Provider Initiated Contact with Patient 08/07/13 [phone]      Chief Complaint  Patient presents with  . Nausea  . Emesis During Pregnancy  . Diarrhea   HPI Comments: Pt is a 23yo G2P1 at 74w5darrives unannounced w c/o "IBS flare" States she had several episodes of diarrhea yesterday and today. Also c/o emesis. Last ate about 6pm tonight. Denies any pain or ctx, no VB or LOF, GFM.   Diarrhea  Associated symptoms include vomiting.      Past Medical History  Diagnosis Date  . IBS (irritable bowel syndrome)   . H/O varicella 09/10/11  . Low iron 09/10/11  . Yeast infection 09/10/11  . Bacterial infection 09/10/11  . Trichomonas 09/10/10  . Hypotension     Past Surgical History  Procedure Laterality Date  . Cesarean section  09/10/11    NRFHT - primary low transverse cesarean section     Family History  Problem Relation Age of Onset  . Hypertension Mother   . Hypertension Father     History  Substance Use Topics  . Smoking status: Current Every Day Smoker -- 0.25 packs/day for 8 years    Types: Cigarettes  . Smokeless tobacco: Never Used  . Alcohol Use: No    Allergies:  Allergies  Allergen Reactions  . Lactose Intolerance (Gi) Diarrhea and Nausea And Vomiting  . Flagyl [Metronidazole] Itching and Rash    Pt has used Metrogel without reaction.    Prescriptions prior to admission  Medication Sig Dispense Refill  . Prenatal Vit-Fe Fumarate-FA (PRENATAL MULTIVITAMIN) TABS tablet Take 1 tablet by mouth daily at 12 noon.      .Marland Kitchenacetaminophen (TYLENOL) 500 MG tablet Take 1,000 mg by mouth daily as needed for pain (back ache).       . Pediatric Multiple Vit-C-FA (FLINSTONES GUMMIES OMEGA-3 DHA) CHEW Chew 2 tablets by mouth daily.        Review of Systems  Gastrointestinal: Positive for vomiting and diarrhea.  Neurological: Positive for dizziness and weakness.   Physical Exam   Blood pressure  109/69, pulse 66, temperature 98.6 F (37 C), resp. rate 18, height 5' 1"  (1.549 m), weight 170 lb 12.8 oz (77.474 kg), last menstrual period 12/07/2012.  Physical Exam  Nursing note and vitals reviewed. Constitutional: She is oriented to person, place, and time. She appears well-developed and well-nourished. No distress.  Cardiovascular: Normal rate, regular rhythm and normal heart sounds.   Respiratory: Effort normal and breath sounds normal.  GI: Soft. Bowel sounds are normal. She exhibits no distension. There is no tenderness. There is no rebound.  Musculoskeletal: Normal range of motion.  Neurological: She is alert and oriented to person, place, and time. She has normal reflexes.  Skin: Skin is warm and dry.  Psychiatric: She has a normal mood and affect. Her behavior is normal.   Results for orders placed during the hospital encounter of 08/06/13 (from the past 24 hour(s))  URINALYSIS, ROUTINE W REFLEX MICROSCOPIC     Status: Abnormal   Collection Time    08/06/13 11:25 PM      Result Value Ref Range   Color, Urine YELLOW  YELLOW   APPearance CLEAR  CLEAR   Specific Gravity, Urine 1.020  1.005 - 1.030   pH 6.5  5.0 - 8.0   Glucose, UA NEGATIVE  NEGATIVE mg/dL   Hgb urine dipstick NEGATIVE  NEGATIVE  Bilirubin Urine NEGATIVE  NEGATIVE   Ketones, ur 15 (*) NEGATIVE mg/dL   Protein, ur NEGATIVE  NEGATIVE mg/dL   Urobilinogen, UA 0.2  0.0 - 1.0 mg/dL   Nitrite NEGATIVE  NEGATIVE   Leukocytes, UA NEGATIVE  NEGATIVE  CBC WITH DIFFERENTIAL     Status: Abnormal   Collection Time    08/07/13 12:10 AM      Result Value Ref Range   WBC 6.7  4.0 - 10.5 K/uL   RBC 3.63 (*) 3.87 - 5.11 MIL/uL   Hemoglobin 10.2 (*) 12.0 - 15.0 g/dL   HCT 30.0 (*) 36.0 - 46.0 %   MCV 82.6  78.0 - 100.0 fL   MCH 28.1  26.0 - 34.0 pg   MCHC 34.0  30.0 - 36.0 g/dL   RDW 13.4  11.5 - 15.5 %   Platelets 286  150 - 400 K/uL   Neutrophils Relative % 69  43 - 77 %   Neutro Abs 4.6  1.7 - 7.7 K/uL    Lymphocytes Relative 22  12 - 46 %   Lymphs Abs 1.5  0.7 - 4.0 K/uL   Monocytes Relative 8  3 - 12 %   Monocytes Absolute 0.5  0.1 - 1.0 K/uL   Eosinophils Relative 1  0 - 5 %   Eosinophils Absolute 0.1  0.0 - 0.7 K/uL   Basophils Relative 0  0 - 1 %   Basophils Absolute 0.0  0.0 - 0.1 K/uL  COMPREHENSIVE METABOLIC PANEL     Status: Abnormal   Collection Time    08/07/13 12:10 AM      Result Value Ref Range   Sodium 139  137 - 147 mEq/L   Potassium 4.2  3.7 - 5.3 mEq/L   Chloride 104  96 - 112 mEq/L   CO2 21  19 - 32 mEq/L   Glucose, Bld 76  70 - 99 mg/dL   BUN 8  6 - 23 mg/dL   Creatinine, Ser 0.70  0.50 - 1.10 mg/dL   Calcium 8.8  8.4 - 10.5 mg/dL   Total Protein 6.6  6.0 - 8.3 g/dL   Albumin 2.3 (*) 3.5 - 5.2 g/dL   AST 18  0 - 37 U/L   ALT 19  0 - 35 U/L   Alkaline Phosphatase 137 (*) 39 - 117 U/L   Total Bilirubin <0.2 (*) 0.3 - 1.2 mg/dL   GFR calc non Af Amer >90  >90 mL/min   GFR calc Af Amer >90  >90 mL/min     MAU Course  Procedures    Assessment and Plan  IUP at 29w5dFHR cat 1 toco - none IBS +ketones, labs otherwise nl  D5LR bolus zofran IV  Will repeat UA to check for ketones, if cleared will d/c home  Feels better States doesn't have GI doctor, will ask office to work on referral  Enc PO hydration and frequent small meals,  immodium if diarrhea persists  F/u office routine      Nathaneal Sommers M 08/07/2013, 1:01 AM

## 2013-08-07 NOTE — Discharge Instructions (Signed)
Fetal Movement Counts Patient Name: __________________________________________________ Patient Due Date: ____________________ Performing a fetal movement count is highly recommended in high-risk pregnancies, but it is good for every pregnant woman to do. Your caregiver may ask you to start counting fetal movements at 28 weeks of the pregnancy. Fetal movements often increase:  After eating a full meal.  After physical activity.  After eating or drinking something sweet or cold.  At rest. Pay attention to when you feel the baby is most active. This will help you notice a pattern of your baby's sleep and wake cycles and what factors contribute to an increase in fetal movement. It is important to perform a fetal movement count at the same time each day when your baby is normally most active.  HOW TO COUNT FETAL MOVEMENTS 1. Find a quiet and comfortable area to sit or lie down on your left side. Lying on your left side provides the best blood and oxygen circulation to your baby. 2. Write down the day and time on a sheet of paper or in a journal. 3. Start counting kicks, flutters, swishes, rolls, or jabs in a 2 hour period. You should feel at least 10 movements within 2 hours. 4. If you do not feel 10 movements in 2 hours, wait 2 3 hours and count again. Look for a change in the pattern or not enough counts in 2 hours. SEEK MEDICAL CARE IF:  You feel less than 10 counts in 2 hours, tried twice.  There is no movement in over an hour.  The pattern is changing or taking longer each day to reach 10 counts in 2 hours.  You feel the baby is not moving as he or she usually does. Date: ____________ Movements: ____________ Start time: ____________ Elizebeth Koller time: ____________  Date: ____________ Movements: ____________ Start time: ____________ Elizebeth Koller time: ____________ Date: ____________ Movements: ____________ Start time: ____________ Elizebeth Koller time: ____________ Date: ____________ Movements: ____________  Start time: ____________ Elizebeth Koller time: ____________ Date: ____________ Movements: ____________ Start time: ____________ Elizebeth Koller time: ____________ Date: ____________ Movements: ____________ Start time: ____________ Elizebeth Koller time: ____________ Date: ____________ Movements: ____________ Start time: ____________ Elizebeth Koller time: ____________ Date: ____________ Movements: ____________ Start time: ____________ Elizebeth Koller time: ____________  Date: ____________ Movements: ____________ Start time: ____________ Elizebeth Koller time: ____________ Date: ____________ Movements: ____________ Start time: ____________ Elizebeth Koller time: ____________ Date: ____________ Movements: ____________ Start time: ____________ Elizebeth Koller time: ____________ Date: ____________ Movements: ____________ Start time: ____________ Elizebeth Koller time: ____________ Date: ____________ Movements: ____________ Start time: ____________ Elizebeth Koller time: ____________ Date: ____________ Movements: ____________ Start time: ____________ Elizebeth Koller time: ____________ Date: ____________ Movements: ____________ Start time: ____________ Elizebeth Koller time: ____________  Date: ____________ Movements: ____________ Start time: ____________ Elizebeth Koller time: ____________ Date: ____________ Movements: ____________ Start time: ____________ Elizebeth Koller time: ____________ Date: ____________ Movements: ____________ Start time: ____________ Elizebeth Koller time: ____________ Date: ____________ Movements: ____________ Start time: ____________ Elizebeth Koller time: ____________ Date: ____________ Movements: ____________ Start time: ____________ Elizebeth Koller time: ____________ Date: ____________ Movements: ____________ Start time: ____________ Elizebeth Koller time: ____________ Date: ____________ Movements: ____________ Start time: ____________ Elizebeth Koller time: ____________  Date: ____________ Movements: ____________ Start time: ____________ Elizebeth Koller time: ____________ Date: ____________ Movements: ____________ Start time: ____________ Elizebeth Koller time:  ____________ Date: ____________ Movements: ____________ Start time: ____________ Elizebeth Koller time: ____________ Date: ____________ Movements: ____________ Start time: ____________ Elizebeth Koller time: ____________ Date: ____________ Movements: ____________ Start time: ____________ Elizebeth Koller time: ____________ Date: ____________ Movements: ____________ Start time: ____________ Elizebeth Koller time: ____________ Date: ____________ Movements: ____________ Start time: ____________ Elizebeth Koller time: ____________  Date: ____________ Movements: ____________ Start time: ____________ Elizebeth Koller  time: ____________ Date: ____________ Movements: ____________ Start time: ____________ Elizebeth Koller time: ____________ Date: ____________ Movements: ____________ Start time: ____________ Elizebeth Koller time: ____________ Date: ____________ Movements: ____________ Start time: ____________ Elizebeth Koller time: ____________ Date: ____________ Movements: ____________ Start time: ____________ Elizebeth Koller time: ____________ Date: ____________ Movements: ____________ Start time: ____________ Elizebeth Koller time: ____________ Date: ____________ Movements: ____________ Start time: ____________ Elizebeth Koller time: ____________  Date: ____________ Movements: ____________ Start time: ____________ Elizebeth Koller time: ____________ Date: ____________ Movements: ____________ Start time: ____________ Elizebeth Koller time: ____________ Date: ____________ Movements: ____________ Start time: ____________ Elizebeth Koller time: ____________ Date: ____________ Movements: ____________ Start time: ____________ Elizebeth Koller time: ____________ Date: ____________ Movements: ____________ Start time: ____________ Elizebeth Koller time: ____________ Date: ____________ Movements: ____________ Start time: ____________ Elizebeth Koller time: ____________ Date: ____________ Movements: ____________ Start time: ____________ Elizebeth Koller time: ____________  Date: ____________ Movements: ____________ Start time: ____________ Elizebeth Koller time: ____________ Date: ____________ Movements:  ____________ Start time: ____________ Elizebeth Koller time: ____________ Date: ____________ Movements: ____________ Start time: ____________ Elizebeth Koller time: ____________ Date: ____________ Movements: ____________ Start time: ____________ Elizebeth Koller time: ____________ Date: ____________ Movements: ____________ Start time: ____________ Elizebeth Koller time: ____________ Date: ____________ Movements: ____________ Start time: ____________ Elizebeth Koller time: ____________ Date: ____________ Movements: ____________ Start time: ____________ Elizebeth Koller time: ____________  Date: ____________ Movements: ____________ Start time: ____________ Elizebeth Koller time: ____________ Date: ____________ Movements: ____________ Start time: ____________ Elizebeth Koller time: ____________ Date: ____________ Movements: ____________ Start time: ____________ Elizebeth Koller time: ____________ Date: ____________ Movements: ____________ Start time: ____________ Elizebeth Koller time: ____________ Date: ____________ Movements: ____________ Start time: ____________ Elizebeth Koller time: ____________ Date: ____________ Movements: ____________ Start time: ____________ Elizebeth Koller time: ____________ Document Released: 06/26/2006 Document Revised: 05/13/2012 Document Reviewed: 03/23/2012 [employer] Patient Information 2014 [place].  Diet and Irritable Bowel Syndrome  No cure has been found for irritable bowel syndrome (IBS). Many options are available to treat the symptoms. Your caregiver will give you the best treatments available for your symptoms. He or she will also encourage you to manage stress and to make changes to your diet. You need to work with your caregiver and Registered Dietician to find the best combination of medicine, diet, counseling, and support to control your symptoms. The following are some diet suggestions. FOODS THAT MAKE IBS WORSE  Fatty foods, such as [place] fries.  Milk products, such as cheese or ice cream.  Chocolate.  Alcohol.  Caffeine (found in coffee and some  sodas).  Carbonated drinks, such as soda. If certain foods cause symptoms, you should eat less of them or stop eating them. FOOD JOURNAL   Keep a journal of the foods that seem to cause distress. Write down:  What you are eating during the day and when.  What problems you are having after eating.  When the symptoms occur in relation to your meals.  What foods always make you feel badly.  Take your notes with you to your caregiver to see if you should stop eating certain foods. FOODS THAT MAKE IBS BETTER Fiber reduces IBS symptoms, especially constipation, because it makes stools soft, bulky, and easier to pass. Fiber is found in bran, bread, cereal, beans, fruit, and vegetables. Examples of foods with fiber include:  Apples.  Peaches.  Pears.  Berries.  Figs.  Broccoli, raw.  Cabbage.  Carrots.  Raw peas.  Kidney beans.  Lima beans.  Whole-grain bread.  Whole-grain cereal. Add foods with fiber to your diet a little at a time. This will let your body get used to them. Too much fiber at once might cause gas and swelling of your abdomen. This can trigger symptoms  in a person with IBS. Caregivers usually recommend a diet with enough fiber to produce soft, painless bowel movements. High fiber diets may cause gas and bloating. However, these symptoms often go away within a few weeks, as your body adjusts. In many cases, dietary fiber may lessen IBS symptoms, particularly constipation. However, it may not help pain or diarrhea. High fiber diets keep the colon mildly enlarged (distended) with the added fiber. This may help prevent spasms in the colon. Some forms of fiber also keep water in the stool, thereby preventing hard stools that are difficult to pass.  Besides telling you to eat more foods with fiber, your caregiver may also tell you to get more fiber by taking a fiber pill or drinking water mixed with a special high fiber powder. An example of this is a natural fiber  laxative containing psyllium seed.  TIPS  Large meals can cause cramping and diarrhea in people with IBS. If this happens to you, try eating 4 or 5 small meals a day, or try eating less at each of your usual 3 meals. It may also help if your meals are low in fat and high in carbohydrates. Examples of carbohydrates are pasta, rice, whole-grain breads and cereals, fruits, and vegetables.  If dairy products cause your symptoms to flare up, you can try eating less of those foods. You might be able to handle yogurt better than other dairy products, because it contains bacteria that helps with digestion. Dairy products are an important source of calcium and other nutrients. If you need to avoid dairy products, be sure to talk with a Registered Dietitian about getting these nutrients through other food sources.  Drink enough water and fluids to keep your urine clear or pale yellow. This is important, especially if you have diarrhea. FOR MORE INFORMATION  International Foundation for Functional Gastrointestinal Disorders: www.iffgd.org  National Digestive Diseases Information Clearinghouse: digestive.AmenCredit.is Document Released: 08/17/2003 Document Revised: 08/19/2011 Document Reviewed: 05/04/2007 [facility] Patient Information 2014 [place], [place].

## 2013-08-10 ENCOUNTER — Encounter (HOSPITAL_COMMUNITY): Payer: Self-pay | Admitting: *Deleted

## 2013-08-10 ENCOUNTER — Inpatient Hospital Stay (HOSPITAL_COMMUNITY)
Admission: AD | Admit: 2013-08-10 | Discharge: 2013-08-10 | Disposition: A | Discharge status: Home / Self Care | Payer: Medicaid Other | Source: Ambulatory Visit | Attending: Obstetrics and Gynecology | Admitting: Obstetrics and Gynecology

## 2013-08-10 DIAGNOSIS — O9989 Other specified diseases and conditions complicating pregnancy, childbirth and the puerperium: Secondary | ICD-10-CM

## 2013-08-10 DIAGNOSIS — O212 Late vomiting of pregnancy: Secondary | ICD-10-CM | POA: Insufficient documentation

## 2013-08-10 DIAGNOSIS — K589 Irritable bowel syndrome without diarrhea: Secondary | ICD-10-CM | POA: Insufficient documentation

## 2013-08-10 DIAGNOSIS — R109 Unspecified abdominal pain: Secondary | ICD-10-CM | POA: Insufficient documentation

## 2013-08-10 DIAGNOSIS — O99891 Other specified diseases and conditions complicating pregnancy: Secondary | ICD-10-CM | POA: Insufficient documentation

## 2013-08-10 DIAGNOSIS — R197 Diarrhea, unspecified: Secondary | ICD-10-CM | POA: Insufficient documentation

## 2013-08-10 DIAGNOSIS — O99330 Smoking (tobacco) complicating pregnancy, unspecified trimester: Secondary | ICD-10-CM | POA: Insufficient documentation

## 2013-08-10 LAB — URINALYSIS, ROUTINE W REFLEX MICROSCOPIC
Bilirubin Urine: NEGATIVE
GLUCOSE, UA: NEGATIVE mg/dL
HGB URINE DIPSTICK: NEGATIVE
Ketones, ur: NEGATIVE mg/dL
Leukocytes, UA: NEGATIVE
Nitrite: NEGATIVE
PH: 6 (ref 5.0–8.0)
Protein, ur: NEGATIVE mg/dL
SPECIFIC GRAVITY, URINE: 1.025 (ref 1.005–1.030)
UROBILINOGEN UA: 0.2 mg/dL (ref 0.0–1.0)

## 2013-08-10 MED ORDER — METOCLOPRAMIDE HCL 5 MG/ML IJ SOLN
10.0000 mg | Freq: Once | INTRAMUSCULAR | Status: AC
  Administered 2013-08-10: 10 mg via INTRAVENOUS
  Filled 2013-08-10: qty 2

## 2013-08-10 MED ORDER — ONDANSETRON HCL 4 MG/2ML IJ SOLN
4.0000 mg | Freq: Once | INTRAMUSCULAR | Status: AC
  Administered 2013-08-10: 4 mg via INTRAVENOUS
  Filled 2013-08-10: qty 2

## 2013-08-10 MED ORDER — METOCLOPRAMIDE HCL 10 MG PO TABS
10.0000 mg | ORAL_TABLET | Freq: Once | ORAL | Status: DC
  Filled 2013-08-10: qty 1

## 2013-08-10 MED ORDER — METOCLOPRAMIDE HCL 10 MG PO TABS: 10.0000 mg | ORAL_TABLET | Freq: Four times a day (QID) | ORAL | Status: DC | PRN

## 2013-08-10 MED ORDER — DEXTROSE IN LACTATED RINGERS 5 % IV SOLN: INTRAVENOUS | Status: DC

## 2013-08-10 MED ORDER — ONDANSETRON 8 MG PO TBDP: 8.0000 mg | ORAL_TABLET | Freq: Three times a day (TID) | ORAL | Status: DC | PRN

## 2013-08-10 MED ORDER — DEXTROSE 5 % IN LACTATED RINGERS IV BOLUS
500.0000 mL | Freq: Once | INTRAVENOUS | Status: AC
  Administered 2013-08-10: 1000 mL via INTRAVENOUS

## 2013-08-10 MED ORDER — LOPERAMIDE HCL 2 MG PO CAPS
4.0000 mg | ORAL_CAPSULE | Freq: Once | ORAL | Status: AC
  Administered 2013-08-10: 4 mg via ORAL
  Filled 2013-08-10: qty 2

## 2013-08-10 NOTE — MAU Note (Signed)
Hx of severe IBS, seen in MAU 2 days ago for vomiting & diarrhea, has had no relief.  Pt has taken immodium once with no relief.

## 2013-08-10 NOTE — MAU Provider Note (Signed)
History     CSN: [id number]  Arrival date and time: 08/10/13 1835   None     Chief Complaint  Patient presents with  . Emesis During Pregnancy  . Diarrhea   HPI Comments: Pt is a G2P1 at [redacted]w[redacted]d arrives unannounced w c/o "IBS symptoms" Pt was seen in MAU on 2/28 w similar complaints. Pt states she's had nausea and vomiting, reports 2-3 episodes of vomiting per day. Also c/o diarrhea, states she has had 2-3 episodes per day. Pt had difficulty explaining exactly what was bothering her. States she just feels "dehydrated" denies any pain, currently states she does have abdominal cramping w diarrhea. I asked pt what she's done in the past for these symptoms and she says she takes a probiotic and tries to take her vitamins.  States she's only taken immodium one time since discharge, despite instructions to do so as needed with diarrhea episodes.   Denies any ctx, VB or LOF, reports GFM.     Past Medical History  Diagnosis Date  . IBS (irritable bowel syndrome)   . H/O varicella 09/10/11  . Low iron 09/10/11  . Yeast infection 09/10/11  . Bacterial infection 09/10/11  . Trichomonas 09/10/10  . Hypotension     Past Surgical History  Procedure Laterality Date  . Cesarean section  09/10/11    NRFHT - primary low transverse cesarean section     Family History  Problem Relation Age of Onset  . Hypertension Mother   . Hypertension Father     History  Substance Use Topics  . Smoking status: Current Every Day Smoker -- 0.25 packs/day for 8 years    Types: Cigarettes  . Smokeless tobacco: Never Used  . Alcohol Use: No    Allergies:  Allergies  Allergen Reactions  . Lactose Intolerance (Gi) Diarrhea and Nausea And Vomiting  . Flagyl [Metronidazole] Itching and Rash    Pt has used Metrogel without reaction.    Prescriptions prior to admission  Medication Sig Dispense Refill  . Prenatal Multivit-Min-Fe-FA (PRENATAL/IRON PO) Take 1 tablet by mouth daily.        Review of Systems   Constitutional: Negative for fever and weight loss.  Gastrointestinal: Positive for nausea, vomiting, abdominal pain and diarrhea. Negative for heartburn.  Genitourinary: Negative for dysuria.  Neurological: Positive for weakness.  Psychiatric/Behavioral: Negative for depression and suicidal ideas. The patient is nervous/anxious.   All other systems reviewed and are negative.   Physical Exam   Blood pressure 121/68, pulse 76, temperature 98.5 F (36.9 C), resp. rate 18, height 5' 1"  (1.549 m), weight 175 lb (79.379 kg), last menstrual period 12/07/2012.  Physical Exam  Nursing note and vitals reviewed. Constitutional: She is oriented to person, place, and time. She appears well-developed and well-nourished. No distress.  Flat affect   HENT:  Head: Normocephalic.  Eyes: Pupils are equal, round, and reactive to light.  Neck: Normal range of motion.  Cardiovascular: Normal rate, regular rhythm and normal heart sounds.   Respiratory: Effort normal and breath sounds normal.  GI: Soft. Bowel sounds are normal. She exhibits no distension. There is no tenderness. There is no rebound.  Musculoskeletal: Normal range of motion.  Neurological: She is alert and oriented to person, place, and time. She has normal reflexes.  Skin: Skin is warm and dry.  Psychiatric: She has a normal mood and affect. Her behavior is normal.    MAU Course  Procedures  1 liter of D5LR zofran IVP reglan IVP immodium  PO   Assessment and Plan  IUP at 63w2dChronic hx of IBS UA clear s.g. 1.025 Labs not repeated today, were nl on 2/28, except mild anemia w hgb 10.8.  FHR cat 1 No ctx on toco  Had long discussion w pt about utilizing PO meds for control of sx's  Pt has appt [facility] appt on Thurs, msg left w office about GI referral Pt states she feels better dc'd home in stable condition RX for zofran 819mODT, and reglan 1038mO, and instructed to take immodium OTC prn    Rodney Wigger M 08/10/2013,  9:07 PM

## 2013-08-10 NOTE — Progress Notes (Addendum)
Pt states she is nauseous and she "threw up today around 2 o'clock" Pt states she threw up twice today.

## 2013-08-10 NOTE — Progress Notes (Signed)
Pt states she is not having any pain until she goes to the bathroom. Pt is in bed tearful at this time

## 2013-08-10 NOTE — MAU Note (Signed)
Pt states she  Has IBS and has been been having frequent bowel movements 3x today.

## 2013-08-10 NOTE — Discharge Instructions (Signed)
Diet and Irritable Bowel Syndrome  No cure has been found for irritable bowel syndrome (IBS). Many options are available to treat the symptoms. Your caregiver will give you the best treatments available for your symptoms. He or she will also encourage you to manage stress and to make changes to your diet. You need to work with your caregiver and Registered Dietician to find the best combination of medicine, diet, counseling, and support to control your symptoms. The following are some diet suggestions. FOODS THAT MAKE IBS WORSE  Fatty foods, such as [place] fries.  Milk products, such as cheese or ice cream.  Chocolate.  Alcohol.  Caffeine (found in coffee and some sodas).  Carbonated drinks, such as soda. If certain foods cause symptoms, you should eat less of them or stop eating them. FOOD JOURNAL   Keep a journal of the foods that seem to cause distress. Write down:  What you are eating during the day and when.  What problems you are having after eating.  When the symptoms occur in relation to your meals.  What foods always make you feel badly.  Take your notes with you to your caregiver to see if you should stop eating certain foods. FOODS THAT MAKE IBS BETTER Fiber reduces IBS symptoms, especially constipation, because it makes stools soft, bulky, and easier to pass. Fiber is found in bran, bread, cereal, beans, fruit, and vegetables. Examples of foods with fiber include:  Apples.  Peaches.  Pears.  Berries.  Figs.  Broccoli, raw.  Cabbage.  Carrots.  Raw peas.  Kidney beans.  Lima beans.  Whole-grain bread.  Whole-grain cereal. Add foods with fiber to your diet a little at a time. This will let your body get used to them. Too much fiber at once might cause gas and swelling of your abdomen. This can trigger symptoms in a person with IBS. Caregivers usually recommend a diet with enough fiber to produce soft, painless bowel movements. High fiber diets may  cause gas and bloating. However, these symptoms often go away within a few weeks, as your body adjusts. In many cases, dietary fiber may lessen IBS symptoms, particularly constipation. However, it may not help pain or diarrhea. High fiber diets keep the colon mildly enlarged (distended) with the added fiber. This may help prevent spasms in the colon. Some forms of fiber also keep water in the stool, thereby preventing hard stools that are difficult to pass.  Besides telling you to eat more foods with fiber, your caregiver may also tell you to get more fiber by taking a fiber pill or drinking water mixed with a special high fiber powder. An example of this is a natural fiber laxative containing psyllium seed.  TIPS  Large meals can cause cramping and diarrhea in people with IBS. If this happens to you, try eating 4 or 5 small meals a day, or try eating less at each of your usual 3 meals. It may also help if your meals are low in fat and high in carbohydrates. Examples of carbohydrates are pasta, rice, whole-grain breads and cereals, fruits, and vegetables.  If dairy products cause your symptoms to flare up, you can try eating less of those foods. You might be able to handle yogurt better than other dairy products, because it contains bacteria that helps with digestion. Dairy products are an important source of calcium and other nutrients. If you need to avoid dairy products, be sure to talk with a Registered Dietitian about getting these nutrients  through other food sources.  Drink enough water and fluids to keep your urine clear or pale yellow. This is important, especially if you have diarrhea. FOR MORE INFORMATION  International Foundation for Functional Gastrointestinal Disorders: www.iffgd.org  National Digestive Diseases Information Clearinghouse: digestive.AmenCredit.is Document Released: 08/17/2003 Document Revised: 08/19/2011 Document Reviewed: 05/04/2007 [facility] Patient Information 2014  [place], [place].  Irritable Bowel Syndrome Irritable Bowel Syndrome (IBS) is caused by a disturbance of normal bowel function. Other terms used are spastic colon, mucous colitis, and irritable colon. It does not require surgery, nor does it lead to cancer. There is no cure for IBS. But with proper diet, stress reduction, and medication, you will find that your problems (symptoms) will gradually disappear or improve. IBS is a common digestive disorder. It usually appears in late adolescence or early adulthood. Women develop it twice as often as men. CAUSES  After food has been digested and absorbed in the small intestine, waste material is moved into the colon (large intestine). In the colon, water and salts are absorbed from the undigested products coming from the small intestine. The remaining residue, or fecal material, is held for elimination. Under normal circumstances, gentle, rhythmic contractions on the bowel walls push the fecal material along the colon towards the rectum. In IBS, however, these contractions are irregular and poorly coordinated. The fecal material is either retained too long, resulting in constipation, or expelled too soon, producing diarrhea. SYMPTOMS  The most common symptom of IBS is pain. It is typically in the lower left side of the belly (abdomen). But it may occur anywhere in the abdomen. It can be felt as heartburn, backache, or even as a dull pain in the arms or shoulders. The pain comes from excessive bowel-muscle spasms and from the buildup of gas and fecal material in the colon. This pain:  Can range from sharp belly (abdominal) cramps to a dull, continuous ache.  Usually worsens soon after eating.  Is typically relieved by having a bowel movement or passing gas. Abdominal pain is usually accompanied by constipation. But it may also produce diarrhea. The diarrhea typically occurs right after a meal or upon arising in the morning. The stools are typically soft and  watery. They are often flecked with secretions (mucus). Other symptoms of IBS include:  Bloating.  Loss of appetite.  Heartburn.  Feeling sick to your stomach (nausea).  Belching  Vomiting  Gas. IBS may also cause a number of symptoms that are unrelated to the digestive system:  Fatigue.  Headaches.  Anxiety  Shortness of breath  Difficulty in concentrating.  Dizziness. These symptoms tend to come and go. DIAGNOSIS  The symptoms of IBS closely mimic the symptoms of other, more serious digestive disorders. So your caregiver may wish to perform a variety of additional tests to exclude these disorders. He/she wants to be certain of learning what is wrong (diagnosis). The nature and purpose of each test will be explained to you. TREATMENT A number of medications are available to help correct bowel function and/or relieve bowel spasms and abdominal pain. Among the drugs available are:  Mild, non-irritating laxatives for severe constipation and to help restore normal bowel habits.  Specific anti-diarrheal medications to treat severe or prolonged diarrhea.  Anti-spasmodic agents to relieve intestinal cramps.  Your caregiver may also decide to treat you with a mild tranquilizer or sedative during unusually stressful periods in your life. The important thing to remember is that if any drug is prescribed for you, make sure that you  take it exactly as directed. Make sure that your caregiver knows how well it worked for you. HOME CARE INSTRUCTIONS   Avoid foods that are high in fat or oils. Some examples TPE:LGKBO cream, butter, frankfurters, sausage, and other fatty meats.  Avoid foods that have a laxative effect, such as fruit, fruit juice, and dairy products.  Cut out carbonated drinks, chewing gum, and "gassy" foods, such as beans and cabbage. This may help relieve bloating and belching.  Bran taken with plenty of liquids may help relieve constipation.  Keep track of what  foods seem to trigger your symptoms.  Avoid emotionally charged situations or circumstances that produce anxiety.  Start or continue exercising.  Get plenty of rest and sleep. MAKE SURE YOU:   Understand these instructions.  Will watch your condition.  Will get help right away if you are not doing well or get worse. Document Released: 05/27/2005 Document Revised: 08/19/2011 Document Reviewed: 01/15/2008 [facility] Patient Information 2014 [place].

## 2013-08-17 ENCOUNTER — Encounter: Payer: Self-pay | Admitting: Gastroenterology

## 2013-08-24 ENCOUNTER — Encounter: Payer: Self-pay | Admitting: Gastroenterology

## 2013-08-24 ENCOUNTER — Ambulatory Visit (INDEPENDENT_AMBULATORY_CARE_PROVIDER_SITE_OTHER): Payer: Medicaid Other | Admitting: Gastroenterology

## 2013-08-24 VITALS — BP 120/60 | HR 88 | Ht 61.0 in | Wt 170.0 lb

## 2013-08-24 DIAGNOSIS — E739 Lactose intolerance, unspecified: Secondary | ICD-10-CM

## 2013-08-24 DIAGNOSIS — F17200 Nicotine dependence, unspecified, uncomplicated: Secondary | ICD-10-CM

## 2013-08-24 NOTE — Patient Instructions (Signed)
Start lactaid enzyme pills. Take 3 pills with any dairy intake. Over time, you will find correct dosing for the amount of dairy you take in. Since you are 9 months pregnant, no invasive testing for now. Please return to see Dr. [name] in 3 months. One of your biggest health concerns is your smoking.  This increases your risk for most cancers and serious cardiovascular diseases such as strokes, heart attacks.  You should try your best to stop.  If you need assistance, please contact your PCP or Smoking Cessation Class at [facility] [phone]) or [place] (1-800-QUIT-NOW).

## 2013-08-24 NOTE — Progress Notes (Addendum)
HPI: This is a   very pleasant 23 year old woman who is here with her son, her husband.  For four years she has had 'severe diarrhrea'  Was daily at first 5-7 times per day, lost 70 pounds.  That was bout 2 years ago.  + nocturnal diarrhea. Stools we  Lately she has epicosic diarrhea 2-3 times per week.    Thought she had parasite once.   She had seen Dr. Juanita Craver in the past 3 years ago.  Told her she had lactose intolerance  She avoids lactose milk, makes things much worse. If she eats ice cream she will have problems.  Nine months pregnant currently.  She is due (c section on April 1st).  She has bad anxiety.   She smokes cigarettes daily even though she is pregnant Review of systems: Pertinent positive and negative review of systems were noted in the above HPI section. Complete review of systems was performed and was otherwise normal.    Past Medical History  Diagnosis Date  . IBS (irritable bowel syndrome)   . H/O varicella 09/10/11  . Low iron 09/10/11  . Yeast infection 09/10/11  . Bacterial infection 09/10/11  . Trichomonas 09/10/10  . Hypotension     Past Surgical History  Procedure Laterality Date  . Cesarean section  09/10/11    NRFHT - primary low transverse cesarean section     Current Outpatient Prescriptions  Medication Sig Dispense Refill  . Prenatal Multivit-Min-Fe-FA (PRENATAL/IRON PO) Take 1 tablet by mouth daily.       No current facility-administered medications for this visit.    Allergies as of 08/24/2013 - Review Complete 08/24/2013  Allergen Reaction Noted  . Lactose intolerance (gi) Diarrhea and Nausea And Vomiting 01/14/2013  . Flagyl [metronidazole] Itching and Rash 07/03/2013    Family History  Problem Relation Age of Onset  . Hypertension Mother   . Hypertension Father     History   Social History  . Marital Status: Single    Spouse Name: N/A    Number of Children: N/A  . Years of Education: N/A   Occupational History  . Not on  file.   Social History Main Topics  . Smoking status: Current Every Day Smoker -- 0.25 packs/day for 8 years    Types: Cigarettes  . Smokeless tobacco: Never Used  . Alcohol Use: No  . Drug Use: Yes    Special: Marijuana     Comment: Last use was 1 month ago.  Marland Kitchen Sexual Activity: Yes    Birth Control/ Protection: None   Other Topics Concern  . Not on file   Social History Narrative  . No narrative on file    Physical Exam: BP 120/60  Pulse 88  Ht 5' 1"  (1.549 m)  Wt 170 lb (77.111 kg)  BMI 32.14 kg/m2  LMP 12/07/2012 Constitutional: generally well-appearing Psychiatric: alert and oriented x3 Eyes: extraocular movements intact Mouth: oral pharynx moist, no lesions Neck: supple no lymphadenopathy Cardiovascular: heart regular rate and rhythm Lungs: clear to auscultation bilaterally Abdomen: soft, nontender, nondistended, no obvious ascites, no peritoneal signs, normal bowel sounds Extremities: no lower extremity edema bilaterally Skin: no lesions on visible extremities    Assessment and plan: 23 y.o. female with 9 months pregnant, chronic loose stools, clear clinical lactose intolerant  She is 9 months pregnant and so invasive testing is not a good idea unless absolutely necessary. She has had chronic diarrhea for years. She saw previous gastroenterologist here in town. She  is clearly lactose intolerant clinically and avoidance lactose-containing milk however she still eats cheese, yogurt periodically. She knows that she eats ice cream she'll have terrible to 3 days. I recommended that she begin taking Lactaid brand enzyme supplements. She will start with 3 pills with any dairy intake and then will gradually decrease as tolerated. She will return to see me in 3 months and sooner if needed. I talked her at length about the fact that she is smoking cigarettes we'll pregnant as being a very detrimental thing to do to her unborn child, she understands but is not interested in  quitting

## 2013-08-25 ENCOUNTER — Other Ambulatory Visit: Payer: Self-pay | Admitting: Obstetrics and Gynecology

## 2013-08-26 ENCOUNTER — Inpatient Hospital Stay (HOSPITAL_COMMUNITY)
Admission: AD | Admit: 2013-08-26 | Discharge: 2013-08-26 | Disposition: A | Discharge status: Home / Self Care | Payer: Medicaid Other | Source: Ambulatory Visit | Attending: Obstetrics and Gynecology | Admitting: Obstetrics and Gynecology

## 2013-08-26 ENCOUNTER — Encounter (HOSPITAL_COMMUNITY): Payer: Self-pay

## 2013-08-26 DIAGNOSIS — K589 Irritable bowel syndrome without diarrhea: Secondary | ICD-10-CM | POA: Insufficient documentation

## 2013-08-26 DIAGNOSIS — O34219 Maternal care for unspecified type scar from previous cesarean delivery: Secondary | ICD-10-CM | POA: Insufficient documentation

## 2013-08-26 DIAGNOSIS — O99330 Smoking (tobacco) complicating pregnancy, unspecified trimester: Secondary | ICD-10-CM | POA: Insufficient documentation

## 2013-08-26 DIAGNOSIS — O4690 Antepartum hemorrhage, unspecified, unspecified trimester: Secondary | ICD-10-CM | POA: Insufficient documentation

## 2013-08-26 NOTE — MAU Note (Signed)
Pt states that around 0200, she was having sexual intercourse. Afterwards, she went to the bathroom and had a lot of bright red bleeding that was running down her legs and in the toilet. States that is now more of a pink color. States some braxton hicks contractions, but nothing different than what she normally experiences.

## 2013-08-26 NOTE — Discharge Instructions (Signed)
Pelvic Rest Pelvic rest is sometimes recommended for women when:   The placenta is partially or completely covering the opening of the cervix (placenta previa).  There is bleeding between the uterine wall and the amniotic sac in the first trimester (subchorionic hemorrhage).  The cervix begins to open without labor starting (incompetent cervix, cervical insufficiency).  The labor is too early (preterm labor). HOME CARE INSTRUCTIONS  Do not have sexual intercourse, stimulation, or an orgasm.  Do not use tampons, douche, or put anything in the vagina.  Do not lift anything over 10 pounds (4.5 kg).  Avoid strenuous activity or straining your pelvic muscles. SEEK MEDICAL CARE IF:  You have any vaginal bleeding during pregnancy. Treat this as a potential emergency.  You have cramping pain felt low in the stomach (stronger than menstrual cramps).  You notice vaginal discharge (watery, mucus, or bloody).  You have a low, dull backache.  There are regular contractions or uterine tightening. SEEK IMMEDIATE MEDICAL CARE IF: You have vaginal bleeding and have placenta previa.  Document Released: 09/21/2010 Document Revised: 08/19/2011 Document Reviewed: 09/21/2010 [facility] Patient Information 2014 [place].

## 2013-08-26 NOTE — MAU Provider Note (Signed)
History   23yo, G2P1001 at 31w3d25 presents with red vaginal bleeding which quickly turned to pink.  Last  IC at 0200.  Denies , UCs, recent fever, resp or GI c/o's, UTI or PIH s/s. GFM.  Chief Complaint  Patient presents with  . Vaginal Bleeding   HPI  OB History   Grav Para Term Preterm Abortions TAB SAB Ect Mult Living   2 1 1  0 0 0 0 0 0 1      Past Medical History  Diagnosis Date  . IBS (irritable bowel syndrome)   . H/O varicella 09/10/11  . Low iron 09/10/11  . Yeast infection 09/10/11  . Bacterial infection 09/10/11  . Trichomonas 09/10/10  . Hypotension     Past Surgical History  Procedure Laterality Date  . Cesarean section  09/10/11    NRFHT - primary low transverse cesarean section     Family History  Problem Relation Age of Onset  . Hypertension Mother   . Hypertension Father     History  Substance Use Topics  . Smoking status: Current Every Day Smoker -- 0.25 packs/day for 8 years    Types: Cigarettes  . Smokeless tobacco: Never Used  . Alcohol Use: No    Allergies:  Allergies  Allergen Reactions  . Lactose Intolerance (Gi) Diarrhea and Nausea And Vomiting  . Flagyl [Metronidazole] Itching and Rash    Pt has used Metrogel without reaction.    Prescriptions prior to admission  Medication Sig Dispense Refill  . Prenatal Multivit-Min-Fe-FA (PRENATAL/IRON PO) Take 1 tablet by mouth daily.        ROS ROS: see HPI above, all other systems are negative  Physical Exam   Blood pressure 120/68, pulse 81, temperature 98.6 F (37 C), temperature source Oral, resp. rate 18, last menstrual period 12/07/2012, SpO2 97.00%.  Physical Exam Chest: Clear Heart: RRR Abdomen: gravid, NT Extremities: WNL  Pelvic exam:  VULVA: 4 cm vulvar excoriation noted in the periurethral area.  Excoriation bled immediately upon examination.     VAGINA: normal appearing vagina with normal color and discharge, no lesions CERVIX: normal appearing cervix without discharge or  lesions UTERUS: uterus is normal size, shape, consistency and nontender ADNEXA: normal adnexa in size, nontender and no masses, exam chaperoned by RN.  FHT: Reactive NST  UCs: None   ED Course  IUP at 34w3dxcoriation of periurethral area likely d/t recent IC  D/c home with precautions and comfort measures Next ROB scheduled for 3/20    OXLinda HedgesNM, MSN 08/26/2013 6:22 AM

## 2013-09-03 ENCOUNTER — Encounter (HOSPITAL_COMMUNITY): Payer: Medicaid Other | Admitting: Anesthesiology

## 2013-09-03 ENCOUNTER — Inpatient Hospital Stay (HOSPITAL_COMMUNITY): Payer: Medicaid Other | Admitting: Anesthesiology

## 2013-09-03 ENCOUNTER — Inpatient Hospital Stay (HOSPITAL_COMMUNITY)
Admission: AD | Admit: 2013-09-03 | Discharge: 2013-09-07 | DRG: 766 | Disposition: A | Discharge status: Home / Self Care | Payer: Medicaid Other | Source: Ambulatory Visit | Attending: Obstetrics & Gynecology | Admitting: Obstetrics & Gynecology

## 2013-09-03 ENCOUNTER — Encounter (HOSPITAL_COMMUNITY): Payer: Self-pay | Admitting: *Deleted

## 2013-09-03 DIAGNOSIS — F1290 Cannabis use, unspecified, uncomplicated: Secondary | ICD-10-CM

## 2013-09-03 DIAGNOSIS — Z98891 History of uterine scar from previous surgery: Secondary | ICD-10-CM

## 2013-09-03 DIAGNOSIS — K589 Irritable bowel syndrome without diarrhea: Secondary | ICD-10-CM | POA: Diagnosis present

## 2013-09-03 DIAGNOSIS — O99892 Other specified diseases and conditions complicating childbirth: Secondary | ICD-10-CM | POA: Diagnosis present

## 2013-09-03 DIAGNOSIS — Z22330 Carrier of Group B streptococcus: Secondary | ICD-10-CM

## 2013-09-03 DIAGNOSIS — O9989 Other specified diseases and conditions complicating pregnancy, childbirth and the puerperium: Secondary | ICD-10-CM

## 2013-09-03 DIAGNOSIS — O99334 Smoking (tobacco) complicating childbirth: Secondary | ICD-10-CM | POA: Diagnosis present

## 2013-09-03 DIAGNOSIS — O99820 Streptococcus B carrier state complicating pregnancy: Secondary | ICD-10-CM

## 2013-09-03 DIAGNOSIS — O34219 Maternal care for unspecified type scar from previous cesarean delivery: Secondary | ICD-10-CM | POA: Diagnosis present

## 2013-09-03 LAB — CBC
HCT: 31.9 % — ABNORMAL LOW (ref 36.0–46.0)
Hemoglobin: 11 g/dL — ABNORMAL LOW (ref 12.0–15.0)
MCH: 28.6 pg (ref 26.0–34.0)
MCHC: 34.5 g/dL (ref 30.0–36.0)
MCV: 83.1 fL (ref 78.0–100.0)
PLATELETS: 274 10*3/uL (ref 150–400)
RBC: 3.84 MIL/uL — ABNORMAL LOW (ref 3.87–5.11)
RDW: 13.5 % (ref 11.5–15.5)
WBC: 6.7 10*3/uL (ref 4.0–10.5)

## 2013-09-03 LAB — TYPE AND SCREEN
ABO/RH(D): O POS
Antibody Screen: NEGATIVE

## 2013-09-03 LAB — RPR: RPR Ser Ql: NONREACTIVE

## 2013-09-03 LAB — ABO/RH: ABO/RH(D): O POS

## 2013-09-03 LAB — OB RESULTS CONSOLE GBS: GBS: POSITIVE

## 2013-09-03 MED ORDER — EPHEDRINE 5 MG/ML INJ: 10.0000 mg | INTRAVENOUS | Status: DC | PRN

## 2013-09-03 MED ORDER — LACTATED RINGERS IV SOLN: 500.0000 mL | INTRAVENOUS | Status: DC | PRN

## 2013-09-03 MED ORDER — DEXTROSE 5 % IV SOLN
5.0000 10*6.[IU] | Freq: Once | INTRAVENOUS | Status: AC
  Administered 2013-09-03: 5 10*6.[IU] via INTRAVENOUS
  Filled 2013-09-03: qty 5

## 2013-09-03 MED ORDER — OXYCODONE-ACETAMINOPHEN 5-325 MG PO TABS: 1.0000 | ORAL_TABLET | ORAL | Status: DC | PRN

## 2013-09-03 MED ORDER — PHENYLEPHRINE 40 MCG/ML (10ML) SYRINGE FOR IV PUSH (FOR BLOOD PRESSURE SUPPORT): 80.0000 ug | PREFILLED_SYRINGE | INTRAVENOUS | Status: DC | PRN

## 2013-09-03 MED ORDER — LIDOCAINE HCL (PF) 1 % IJ SOLN
30.0000 mL | INTRAMUSCULAR | Status: DC | PRN
Start: 2013-09-03 — End: 2013-09-04
  Filled 2013-09-03: qty 30

## 2013-09-03 MED ORDER — TERBUTALINE SULFATE 1 MG/ML IJ SOLN: 0.2500 mg | Freq: Once | INTRAMUSCULAR | Status: AC | PRN

## 2013-09-03 MED ORDER — PHENYLEPHRINE 40 MCG/ML (10ML) SYRINGE FOR IV PUSH (FOR BLOOD PRESSURE SUPPORT)
80.0000 ug | PREFILLED_SYRINGE | INTRAVENOUS | Status: DC | PRN
  Filled 2013-09-03 (×2): qty 10

## 2013-09-03 MED ORDER — LACTATED RINGERS IV SOLN
500.0000 mL | Freq: Once | INTRAVENOUS | Status: AC
  Administered 2013-09-03: 500 mL via INTRAVENOUS

## 2013-09-03 MED ORDER — ACETAMINOPHEN 325 MG PO TABS: 650.0000 mg | ORAL_TABLET | ORAL | Status: DC | PRN

## 2013-09-03 MED ORDER — PENICILLIN G POTASSIUM 5000000 UNITS IJ SOLR
2.5000 10*6.[IU] | INTRAVENOUS | Status: DC
  Administered 2013-09-03 – 2013-09-04 (×8): 2.5 10*6.[IU] via INTRAVENOUS
  Filled 2013-09-03 (×10): qty 2.5

## 2013-09-03 MED ORDER — OXYTOCIN BOLUS FROM INFUSION
500.0000 mL | INTRAVENOUS | Status: DC
Start: 2013-09-03 — End: 2013-09-04

## 2013-09-03 MED ORDER — LIDOCAINE HCL (PF) 1 % IJ SOLN
INTRAMUSCULAR | Status: DC | PRN
  Administered 2013-09-03: 5 mL

## 2013-09-03 MED ORDER — IBUPROFEN 600 MG PO TABS: 600.0000 mg | ORAL_TABLET | Freq: Four times a day (QID) | ORAL | Status: DC | PRN

## 2013-09-03 MED ORDER — ONDANSETRON HCL 4 MG/2ML IJ SOLN: 4.0000 mg | Freq: Four times a day (QID) | INTRAMUSCULAR | Status: DC | PRN

## 2013-09-03 MED ORDER — CITRIC ACID-SODIUM CITRATE 334-500 MG/5ML PO SOLN
30.0000 mL | ORAL | Status: DC | PRN
  Administered 2013-09-04: 30 mL via ORAL
  Filled 2013-09-03: qty 15

## 2013-09-03 MED ORDER — FENTANYL 2.5 MCG/ML BUPIVACAINE 1/10 % EPIDURAL INFUSION (WH - ANES)
14.0000 mL/h | INTRAMUSCULAR | Status: DC | PRN
  Administered 2013-09-03 – 2013-09-04 (×4): 14 mL/h via EPIDURAL
  Filled 2013-09-03 (×4): qty 125

## 2013-09-03 MED ORDER — OXYTOCIN 40 UNITS IN LACTATED RINGERS INFUSION - SIMPLE MED
62.5000 mL/h | INTRAVENOUS | Status: DC
  Filled 2013-09-03: qty 1000

## 2013-09-03 MED ORDER — OXYTOCIN 40 UNITS IN LACTATED RINGERS INFUSION - SIMPLE MED
1.0000 m[IU]/min | INTRAVENOUS | Status: DC
  Administered 2013-09-03: 1 m[IU]/min via INTRAVENOUS

## 2013-09-03 MED ORDER — LACTATED RINGERS IV SOLN
INTRAVENOUS | Status: DC
  Administered 2013-09-03 – 2013-09-04 (×5): via INTRAVENOUS

## 2013-09-03 MED ORDER — DIPHENHYDRAMINE HCL 50 MG/ML IJ SOLN: 12.5000 mg | INTRAMUSCULAR | Status: DC | PRN

## 2013-09-03 NOTE — Progress Notes (Signed)
Patient ID: [name], female   DOB: 31-Aug-1990, 23 y.o.   MRN: [id number] AMYBETH SIEG is a 23 y.o. G2P1001 at 32w4dadmitted for SROM  Subjective: Comfortable w epidural   Objective: BP 116/70  Pulse 62  Temp(Src) 97 F (36.1 C) (Oral)  Resp 18  Ht 5' 1"  (1.549 m)  Wt 170 lb (77.111 kg)  BMI 32.14 kg/m2  SpO2 100%  LMP 12/07/2012     FHT:  Cat 1 UC:   toco 3-5, irregular   SVE:   Dilation: 2 Effacement (%): 80 Station: -1 Exam by:: Linzi Ohlinger CNM  IUPC placed without difficulty   Assessment / Plan:  Labor: SROM x16hrs, no sx's chorio,  Preeclampsia:  no s/s Fetal Wellbeing:  Category I Pain Control:  Epidural Anticipated MOD:  NSVD  GBS pos, rcv'd PCN TOLAC SROM  Continue to titrate pitocin for adequate MVU's   Update physician PRN   LCharlane Ferretti3/27/2015, 10:55 PM

## 2013-09-03 NOTE — Progress Notes (Signed)
  Subjective: Pt reports not feeling any UCs.  Discussed R&B of Pitocin, pt voiced understanding and agreed to proceed.  Objective: BP 114/62  Pulse 58  Temp(Src) 97.9 F (36.6 C) (Oral)  Resp 20  LMP 12/07/2012      FHT:  Cat I UC:   irregular, every 2-6 minutes  SVE:   Dilation: 1 Effacement (%): 70 Station: -2 Exam by:: Zackory Pudlo  Assessment:   Augmentation of labor after SROM with no cervical change, Pitocin started now  Labor: Mild UCs with minimal cervical change  Preeclampsia: no signs or symptoms of toxicity  Fetal Wellbeing: Category I  Pain Control: Labor support without medications  I/D: GBS pos, PCN x 2 doses given; SROM at 0500; Afebrile  Anticipated MOD: NSVD   Sharon Clayton 09/03/2013, 2:26 PM

## 2013-09-03 NOTE — Progress Notes (Signed)
J Oxley notified of pt's arrival and SROM, states she will put admission orders in

## 2013-09-03 NOTE — Anesthesia Procedure Notes (Signed)
Epidural Patient location during procedure: OB Start time: 09/03/2013 7:24 PM  Staffing Anesthesiologist: Rudean Curt Performed by: anesthesiologist   Preanesthetic Checklist Completed: patient identified, site marked, surgical consent, pre-op evaluation, timeout performed, IV checked, risks and benefits discussed and monitors and equipment checked  Epidural Patient position: sitting Prep: site prepped and draped and DuraPrep Patient monitoring: continuous pulse ox and blood pressure Approach: midline Injection technique: LOR air  Needle:  Needle type: Tuohy  Needle gauge: 17 G Needle length: 9 cm and 9 Needle insertion depth: 5 cm cm Catheter type: closed end flexible Catheter size: 19 Gauge Catheter at skin depth: 10 cm Test dose: negative  Assessment Events: blood not aspirated, injection not painful, no injection resistance, negative IV test and no paresthesia  Additional Notes Patient identified.  Risk benefits discussed including failed block, incomplete pain control, headache, nerve damage, paralysis, blood pressure changes, nausea, vomiting, reactions to medication both toxic or allergic, and postpartum back pain.  Patient expressed understanding and wished to proceed.  All questions were answered.  Sterile technique used throughout procedure and epidural site dressed with sterile barrier dressing. No paresthesia or other complications noted.The patient did not experience any signs of intravascular injection such as tinnitus or metallic taste in mouth nor signs of intrathecal spread such as rapid motor block. Please see nursing notes for vital signs.

## 2013-09-03 NOTE — Anesthesia Preprocedure Evaluation (Signed)
Anesthesia Evaluation  Patient identified by MRN, date of birth, ID band Patient awake    Reviewed: Allergy & Precautions, H&P , Patient's Chart, lab work & pertinent test results  Airway Mallampati: II TM Distance: >3 FB Neck ROM: full    Dental   Pulmonary Current Smoker,  breath sounds clear to auscultation        Cardiovascular Rhythm:regular Rate:Normal     Neuro/Psych    GI/Hepatic   Endo/Other    Renal/GU      Musculoskeletal   Abdominal   Peds  Hematology   Anesthesia Other Findings   Reproductive/Obstetrics (+) Pregnancy                           Anesthesia Physical Anesthesia Plan  ASA: II  Anesthesia Plan: Epidural   Post-op Pain Management:    Induction:   Airway Management Planned:   Additional Equipment:   Intra-op Plan:   Post-operative Plan:   Informed Consent: I have reviewed the patients History and Physical, chart, labs and discussed the procedure including the risks, benefits and alternatives for the proposed anesthesia with the patient or authorized representative who has indicated his/her understanding and acceptance.     Plan Discussed with:   Anesthesia Plan Comments:         Anesthesia Quick Evaluation

## 2013-09-03 NOTE — Progress Notes (Signed)
  Subjective: Pt seems frustrated and is teary.  Discussed epidural as an option and pt stated she is ready to get an epidural.   Objective: BP 121/70  Pulse 57  Temp(Src) 97.4 F (36.3 C) (Oral)  Resp 20  Ht 5' 1"  (1.549 m)  Wt 170 lb (77.111 kg)  BMI 32.14 kg/m2  LMP 12/07/2012      FHT: Cat I UC:   regular, every 2 minutes  SVE:   Dilation: 1 Effacement (%): 70 Station: -1 Exam by:: Whitman Meinhardt  Assessment:   Augmentation of labor, progressing well  Labor: Progressing on Pitocin at 6 miliU  Preeclampsia: no signs or symptoms of toxicity  Fetal Wellbeing: Category I  Pain Control: Epidural  I/D: GBS pos, PCN Q 4hrs; SROM at 0500; Afebrile    Anticipated MOD: NSVD  Sharon Clayton 09/03/2013, 6:49 PM

## 2013-09-03 NOTE — H&P (Signed)
Sharon Clayton is a 23 y.o. female, G2P1001 at 35w4dweeks, presenting for SROM at 0500 this am and some mild UCs. Denies VB, recent fever, resp or GI c/o's, UTI or PIH s/s. GFM. Desires epidural.  Patient Active Problem List   Diagnosis Date Noted  . Normal labor and delivery 09/03/2013  . GBS (group B Streptococcus carrier), +RV culture, currently pregnant 06/12/2013  . Smoker 06/08/2013  . Hx of preeclampsia, prior pregnancy, currently pregnant 06/08/2013  . IBS (irritable bowel syndrome) 06/08/2013  . Uses marijuana 04/13/2013  . Previous cesarean delivery, antepartum condition or complication 158/83/2549 . Heavy periods 09/11/2011    History of present pregnancy: Patient entered care at 26 weeks.  [facility] weeks EDC of 09/13/13 was established by LMP.  (Athena shows 09/15/13 by 19w u/s) Anatomy scan:  19 weeks, limited, with normal findings and an anterior placenta. Anatomy scan completed at 24 weeks.  Additional [place]uations:  340w2dor S<D - EFW 32.6%ile, 2090g, AFI normal, vtx.   Significant prenatal events:  none   Last evaluation:  08/27/13 at 3728w2do SVE  Per Dr DilCharlesetta GaribaldiLTCS WITH SINGLE CLOSURE. VBAC CONSENT SIGNED  OB History   Grav Para Term Preterm Abortions TAB SAB Ect Mult Living   2 1 1  0 0 0 0 0 0 1     Past Medical History  Diagnosis Date  . IBS (irritable bowel syndrome)   . H/O varicella 09/10/11  . Low iron 09/10/11  . Yeast infection 09/10/11  . Bacterial infection 09/10/11  . Trichomonas 09/10/10  . Hypotension    Past Surgical History  Procedure Laterality Date  . Cesarean section  09/10/11    NRFHT - primary low transverse cesarean section    Family History: family history includes Hypertension in her father and mother. Social History:  reports that she has been smoking Cigarettes.  She has a 2 pack-year smoking history. She has never used smokeless tobacco. She reports that she uses illicit drugs (Marijuana).  She reports that she does not drink alcohol.   Prenatal Transfer Tool  Maternal Diabetes: No Genetic Screening: Declined Maternal Ultrasounds/Referrals: Normal Fetal Ultrasounds or other Referrals:  None Maternal Substance Abuse:  Yes:  Type: Marijuana Significant Maternal Medications:  None Significant Maternal Lab Results: Lab values include: Group B Strep positive    ROS: see HPI above, all other systems are negative   Allergies  Allergen Reactions  . Lactose Intolerance (Gi) Diarrhea and Nausea And Vomiting  . Flagyl [Metronidazole] Itching and Rash    Pt has used Metrogel without reaction, but doesn't tolerate tablets     Dilation: 1 Effacement (%): 60 Station: -2 Exam by:: Andie Mungin Blood pressure 124/76, pulse 68, temperature 98.4 F (36.9 C), temperature source Oral, resp. rate 20, last menstrual period 12/07/2012.  Chest clear Heart RRR without murmur Abd gravid, NT Ext: WNL  FHR: Cat I UCs:  Q 2-3 min  Prenatal labs: ABO, Rh: O/POS/-- (10/29 1112) Antibody: NEG (10/29 1112) Rubella:   Immune RPR: NON REAC (10/29 1112)  HBsAg: NEGATIVE (10/29 1112)  HIV: NON REACTIVE (10/29 1112)  GBS: Positive (03/27 0959) Sickle cell/Hgb electrophoresis:  Normal study Pap:  03/18/13 WNL GC:  Neg  Chlamydia:  Neg  Genetic screenings:  Declined Glucola:  80 Other:  none  Assessment/Plan: IUP at 39w34w4dM with mild UCs Desires VBAC GBS pos  Admit to BS per c/w Dr StriRaphael Gibneytinue to labor,  recheck in 4 hours, may discuss starting Pitocin at that time. GBS PCN prophylaxis initiated    Emilee Hero, MSN 09/03/2013, 11:01 AM

## 2013-09-04 ENCOUNTER — Encounter (HOSPITAL_COMMUNITY)
Admission: AD | Disposition: A | Discharge status: Home / Self Care | Payer: Self-pay | Source: Ambulatory Visit | Attending: Obstetrics & Gynecology

## 2013-09-04 ENCOUNTER — Encounter (HOSPITAL_COMMUNITY): Payer: Self-pay

## 2013-09-04 DIAGNOSIS — O9989 Other specified diseases and conditions complicating pregnancy, childbirth and the puerperium: Secondary | ICD-10-CM

## 2013-09-04 DIAGNOSIS — O99892 Other specified diseases and conditions complicating childbirth: Secondary | ICD-10-CM

## 2013-09-04 DIAGNOSIS — Z98891 History of uterine scar from previous surgery: Secondary | ICD-10-CM

## 2013-09-04 DIAGNOSIS — O34219 Maternal care for unspecified type scar from previous cesarean delivery: Secondary | ICD-10-CM

## 2013-09-04 DIAGNOSIS — K589 Irritable bowel syndrome without diarrhea: Secondary | ICD-10-CM

## 2013-09-04 SURGERY — Surgical Case
Anesthesia: Epidural | Site: Abdomen

## 2013-09-04 MED ORDER — KETOROLAC TROMETHAMINE 30 MG/ML IJ SOLN
30.0000 mg | Freq: Four times a day (QID) | INTRAMUSCULAR | Status: DC | PRN
  Administered 2013-09-05: 30 mg via INTRAVENOUS

## 2013-09-04 MED ORDER — MEPERIDINE HCL 25 MG/ML IJ SOLN: 6.2500 mg | INTRAMUSCULAR | Status: DC | PRN

## 2013-09-04 MED ORDER — PROMETHAZINE HCL 25 MG/ML IJ SOLN: 6.2500 mg | INTRAMUSCULAR | Status: DC | PRN

## 2013-09-04 MED ORDER — BUPIVACAINE HCL (PF) 0.5 % IJ SOLN
INTRAMUSCULAR | Status: DC | PRN
  Administered 2013-09-04: 30 mL

## 2013-09-04 MED ORDER — DEXTROSE 5 % IV SOLN
2.0000 g | Freq: Once | INTRAVENOUS | Status: AC
  Administered 2013-09-03: 3 g via INTRAVENOUS
  Filled 2013-09-04: qty 2

## 2013-09-04 MED ORDER — MEPERIDINE HCL 25 MG/ML IJ SOLN
INTRAMUSCULAR | Status: AC
  Filled 2013-09-04: qty 1

## 2013-09-04 MED ORDER — ONDANSETRON HCL 4 MG/2ML IJ SOLN
INTRAMUSCULAR | Status: AC
  Filled 2013-09-04: qty 2

## 2013-09-04 MED ORDER — PHENYLEPHRINE 8 MG IN D5W 100 ML (0.08MG/ML) PREMIX OPTIME
INJECTION | INTRAVENOUS | Status: DC | PRN
  Administered 2013-09-04: 60 ug/min via INTRAVENOUS

## 2013-09-04 MED ORDER — SCOPOLAMINE 1 MG/3DAYS TD PT72
1.0000 | MEDICATED_PATCH | Freq: Once | TRANSDERMAL | Status: DC
  Administered 2013-09-05: 1.5 mg via TRANSDERMAL

## 2013-09-04 MED ORDER — MORPHINE SULFATE (PF) 0.5 MG/ML IJ SOLN
INTRAMUSCULAR | Status: DC | PRN
  Administered 2013-09-04: 1 mg via INTRAVENOUS
  Administered 2013-09-04: 4 mg via EPIDURAL

## 2013-09-04 MED ORDER — MEPERIDINE HCL 25 MG/ML IJ SOLN
INTRAMUSCULAR | Status: DC | PRN
  Administered 2013-09-04 (×2): 12.5 mg via INTRAVENOUS

## 2013-09-04 MED ORDER — BUPIVACAINE HCL (PF) 0.5 % IJ SOLN
INTRAMUSCULAR | Status: AC
  Filled 2013-09-04: qty 30

## 2013-09-04 MED ORDER — OXYTOCIN 10 UNIT/ML IJ SOLN
INTRAMUSCULAR | Status: AC
  Filled 2013-09-04: qty 4

## 2013-09-04 MED ORDER — LACTATED RINGERS IV SOLN
INTRAVENOUS | Status: DC | PRN
  Administered 2013-09-03: 23:00:00 via INTRAVENOUS

## 2013-09-04 MED ORDER — CEFAZOLIN SODIUM-DEXTROSE 2-3 GM-% IV SOLR: 2.0000 g | Freq: Once | INTRAVENOUS | Status: DC

## 2013-09-04 MED ORDER — KETOROLAC TROMETHAMINE 30 MG/ML IJ SOLN: 30.0000 mg | Freq: Four times a day (QID) | INTRAMUSCULAR | Status: DC | PRN

## 2013-09-04 MED ORDER — MORPHINE SULFATE 0.5 MG/ML IJ SOLN
INTRAMUSCULAR | Status: AC
  Filled 2013-09-04: qty 10

## 2013-09-04 MED ORDER — FENTANYL CITRATE 0.05 MG/ML IJ SOLN: 25.0000 ug | INTRAMUSCULAR | Status: DC | PRN

## 2013-09-04 MED ORDER — OXYTOCIN 10 UNIT/ML IJ SOLN
40.0000 [IU] | INTRAVENOUS | Status: DC | PRN
  Administered 2013-09-04: 40 [IU] via INTRAVENOUS

## 2013-09-04 MED ORDER — MIDAZOLAM HCL 2 MG/2ML IJ SOLN: 0.5000 mg | Freq: Once | INTRAMUSCULAR | Status: AC | PRN

## 2013-09-04 MED ORDER — ONDANSETRON HCL 4 MG/2ML IJ SOLN
INTRAMUSCULAR | Status: DC | PRN
  Administered 2013-09-04: 4 mg via INTRAVENOUS

## 2013-09-04 MED ORDER — 0.9 % SODIUM CHLORIDE (POUR BTL) OPTIME
TOPICAL | Status: DC | PRN
  Administered 2013-09-04: 1000 mL

## 2013-09-04 MED ORDER — SODIUM BICARBONATE 8.4 % IV SOLN
INTRAVENOUS | Status: DC | PRN
  Administered 2013-09-04: 10 mL via EPIDURAL

## 2013-09-04 SURGICAL SUPPLY — 36 items
CLAMP CORD UMBIL (MISCELLANEOUS) IMPLANT
CLOTH BEACON ORANGE TIMEOUT ST (SAFETY) ×3 IMPLANT
DRAPE LG THREE QUARTER DISP (DRAPES) IMPLANT
DRSG OPSITE POSTOP 4X10 (GAUZE/BANDAGES/DRESSINGS) ×3 IMPLANT
DURAPREP 26ML APPLICATOR (WOUND CARE) ×3 IMPLANT
ELECT REM PT RETURN 9FT ADLT (ELECTROSURGICAL) ×3
ELECTRODE REM PT RTRN 9FT ADLT (ELECTROSURGICAL) ×1 IMPLANT
EXTRACTOR VACUUM M CUP 4 TUBE (SUCTIONS) IMPLANT
EXTRACTOR VACUUM M CUP 4' TUBE (SUCTIONS)
GLOVE BIO SURGEON STRL SZ7 (GLOVE) ×3 IMPLANT
GLOVE BIOGEL PI IND STRL 7.0 (GLOVE) ×1 IMPLANT
GLOVE BIOGEL PI INDICATOR 7.0 (GLOVE) ×2
GOWN STRL REUS W/TWL LRG LVL3 (GOWN DISPOSABLE) ×6 IMPLANT
KIT ABG SYR 3ML LUER SLIP (SYRINGE) IMPLANT
NDL HYPO 25X5/8 SAFETYGLIDE (NEEDLE) ×1 IMPLANT
NEEDLE HYPO 22GX1.5 SAFETY (NEEDLE) ×3 IMPLANT
NEEDLE HYPO 25X5/8 SAFETYGLIDE (NEEDLE) ×3 IMPLANT
NS IRRIG 1000ML POUR BTL (IV SOLUTION) ×3 IMPLANT
PACK C SECTION WH (CUSTOM PROCEDURE TRAY) ×3 IMPLANT
PAD ABD 7.5X8 STRL (GAUZE/BANDAGES/DRESSINGS) ×3 IMPLANT
PAD OB MATERNITY 4.3X12.25 (PERSONAL CARE ITEMS) ×3 IMPLANT
RETRACTOR WND ALEXIS 25 LRG (MISCELLANEOUS) IMPLANT
RTRCTR C-SECT PINK 25CM LRG (MISCELLANEOUS) ×2 IMPLANT
RTRCTR WOUND ALEXIS 25CM LRG (MISCELLANEOUS) ×3
STAPLER VISISTAT 35W (STAPLE) IMPLANT
SUT PDS AB 0 CT1 27 (SUTURE) IMPLANT
SUT PDS AB 0 CTX 36 PDP370T (SUTURE) IMPLANT
SUT PLAIN 2 0 XLH (SUTURE) IMPLANT
SUT VIC AB 0 CT1 36 (SUTURE) ×6 IMPLANT
SUT VIC AB 0 CTX 36 (SUTURE) ×6
SUT VIC AB 0 CTX36XBRD ANBCTRL (SUTURE) ×2 IMPLANT
SUT VIC AB 4-0 KS 27 (SUTURE) ×3 IMPLANT
SYR 30ML LL (SYRINGE) ×3 IMPLANT
TOWEL OR 17X24 6PK STRL BLUE (TOWEL DISPOSABLE) ×3 IMPLANT
TRAY FOLEY CATH 14FR (SET/KITS/TRAYS/PACK) ×3 IMPLANT
WATER STERILE IRR 1000ML POUR (IV SOLUTION) ×3 IMPLANT

## 2013-09-04 NOTE — Progress Notes (Signed)
Patient ID: Sharon Clayton, female   DOB: 1990/06/24, 23 y.o.   MRN: [id number] Sharon Clayton is a 23 y.o. G2P1001 at 4w5dadmitted for SROM  Subjective: Comfortable w epidural   Objective: BP 114/64  Pulse 61  Temp(Src) 98.3 F (36.8 C) (Oral)  Resp 18  Ht 5' 1"  (1.549 m)  Wt 170 lb (77.111 kg)  BMI 32.14 kg/m2  SpO2 100%  LMP 12/07/2012     FHT:  Cat 1, occ mild early variable UC:   toco 2-4, MVU's about 200  SVE:   Dilation: 4 Effacement (%): 100 Station: -1 Exam by:: Rianne Degraaf CNM    Assessment / Plan:  Labor: Progressing normally Preeclampsia:  no s/s Fetal Wellbeing:  Category I Pain Control:  Epidural Anticipated MOD:  NSVD  GBS pos TOLAC Early labor Continue pitocin titration  SROM x 23hrs, no sx's chorio    Update physician PRN   LCharlane Ferretti3/28/2015, 3:25 AM

## 2013-09-04 NOTE — Progress Notes (Signed)
Patient ID: Sharon Clayton, female   DOB: Sep 14, 1990, 23 y.o.   MRN: [id number] Sharon Clayton is a 23 y.o. G2P1001 at 77w5dadmitted for early labor  Subjective: Comfortable w epidural   Objective: BP 120/77  Pulse 80  Temp(Src) 98.3 F (36.8 C) (Oral)  Resp 18  Ht 5' 1"  (1.549 m)  Wt 170 lb (77.111 kg)  BMI 32.14 kg/m2  SpO2 100%  LMP 12/07/2012     FHT:  Cat 1 UC:   toco 2-4  MVU's avg about 200  SVE:   Dilation: 5.5 Effacement (%): 100 Station: -1 Exam by:: Kevon Tench CNM    Assessment / Plan:  Labor: Progressing normally Preeclampsia:  no s/s Fetal Wellbeing:  Category I Pain Control:  Epidural Anticipated MOD:  NSVD  GBS pos SROM x24hrs, no sx's chorio TOLAC Continue pitocin titration    Dr SRaphael Gibneyupdated    LCharlane Ferretti3/28/2015, 7:38 AM

## 2013-09-04 NOTE — Progress Notes (Signed)
Dr Raphael Gibney, Arville Go CNM, Diona Browner RN, and Dr Harolyn Rutherford are currently having a meeting about patient care.

## 2013-09-04 NOTE — Progress Notes (Signed)
The patient has said that she does not want me to take care of her labor (although she admits that this is the first time that we have ever met).  I explained to the patient and her significant other that if she wants me to try to find another doctor to care for her, then I will. I explained that our CNMs will also not care for her. I explained that the patient will have to accept the risks of refusing our care and transferring her care at this point in labor.  FHTs are currently a Cat 1.  Ms. Sharon [facility] from [facility] was present for the discussion and will try to help.  Lesli Albee, M.D.

## 2013-09-04 NOTE — Transfer of Care (Signed)
Immediate Anesthesia Transfer of Care Note  Patient: Sharon Clayton  Procedure(s) Performed: Procedure(s): CESAREAN SECTION- repeat (N/A)  Patient Location: PACU  Anesthesia Type:Epidural  Level of Consciousness: awake, alert  and oriented  Airway & Oxygen Therapy: Patient Spontanous Breathing  Post-op Assessment: Report given to PACU RN and Post -op Vital signs reviewed and stable  Post vital signs: Reviewed and stable  Complications: No apparent anesthesia complications

## 2013-09-04 NOTE — Progress Notes (Signed)
Patient ID: Sharon Clayton, female   DOB: [birth year], 23 y.o.   MRN: [id number] Sharon Clayton is a 23 y.o. G2P1001 at 47w5dadmitted for SROM   Subjective: Comfortable w epidural, denies feeling any pressure, feeling frustrated w lack of progress, but is committed to having a vaginal delivery   Objective: BP 131/82  Pulse 63  Temp(Src) 98.5 F (36.9 C) (Oral)  Resp 18  Ht 5' 1"  (1.549 m)  Wt 170 lb (77.111 kg)  BMI 32.14 kg/m2  SpO2 100%  LMP 12/07/2012     FHT:  Cat 1 UC:   toco 2-3, adequate MVU's   SVE:   Dilation: 8 Effacement (%): 100 Station: -1 Exam by:: Rozelle Caudle CNM    Assessment / Plan:  Labor: arrest of labor Preeclampsia:  no s/s Fetal Wellbeing:  Category I Pain Control:  Epidural Anticipated MOD:  undetermined   GBS pos, has been on PCN TOLAC SROM x 40hrs, no sx's chorio  No cervical change since 1030 am, despite MVU's >200 on pitocin augmentation  Recommended to proceed w cesarean and pt is refusing at this time rv'd R/B including bladder, pelvic floor damage, uterine rupture, increased for PPH Pt accepts these risks at this time and would like to continue in labor Pt also states she is refusing care by Dr SRaphael Gibneythat is currently the attending  Explained our model of care and that currently care would be by the myself, however if complications occur or we proceed w cesarean Dr SRaphael Gibneyis the back up physician   C/w Dr SRaphael Gibney states he will come talk to pt and discuss POC      Drayden Lukas M 09/04/2013, 9:05 PM

## 2013-09-04 NOTE — Progress Notes (Signed)
[facility] Attending Note  Assumed the care of this patient from Dr. Raphael Gibney ([facility]); patient no longer wants to be under his group's care.  Sharon Clayton is a 23 y.o. G2P1001 [facility] but has made no cervical dilation since 1000 today.  The plan by Dr. SRaphael Gibneywas to proceed with cesarean delivery.   Subjective: Comfortable with epidural, denies feeling any pressure.  Feeling frustrated about needing a cesarean delivery, but agrees to be under the care of the [facility].  Objective: BP 131/82  Pulse 63  Temp(Src) 98.8 F (37.1 C) (Oral)  Resp 18  Ht 5' 1"  (1.549 m)  Wt 170 lb (77.111 kg)  BMI 32.14 kg/m2  SpO2 100%  LMP 12/07/2012 FHT:  Cat 1 UC:   toco 2-3, adequate MVU's   SVE:   Dilation: 6 Effacement (%): 100 Station: 0 Exam by:: Dr. AHarolyn Rutherford Assessment / Plan: Failure of cervical dilation despite multiple hours of adequate contractions; cesarean section recommended.  The risks of cesarean section discussed with the patient included but were not limited to: bleeding which may require transfusion or reoperation; infection which may require antibiotics; injury to bowel, bladder, ureters or other surrounding organs; injury to the fetus; need for additional procedures including hysterectomy in the event of a life-threatening hemorrhage; placental abnormalities wth subsequent pregnancies, incisional problems, thromboembolic phenomenon and other postoperative/anesthesia complications. The patient concurred with the proposed plan, giving informed written consent for the procedure.    Anesthesia and OR aware. Preoperative prophylactic antibiotics and SCDs ordered on call to the OR.  To OR when ready.  Patient met with Dr. RGlyn Ade[facility] who will be assisting me during this procedure and was informed of the nature of our practice.   UOsborne Oman MD 09/04/2013, 10:02 PM

## 2013-09-04 NOTE — Progress Notes (Addendum)
  Subjective: Pt comfortable.  Objective: BP 129/76  Pulse 75  Temp(Src) 98 F (36.7 C) (Oral)  Resp 18  Ht 5' 1"  (1.549 m)  Wt 170 lb (77.111 kg)  BMI 32.14 kg/m2  SpO2 100%  LMP 12/07/2012 I/O last 3 completed shifts: In: -  Out: 800 [Urine:800]    FHT:  Cat I UC:   regular, every 2-3 minutes  SVE:   Dilation: 8 Effacement (%): 100 Station: -1 Exam by:: Greer Koeppen  Assessment:   Augmentation of labor, progressing well; Pitocin at 17 miliU; MVUs 140-175  Labor: Progressing normally  Preeclampsia: no signs or symptoms of toxicity  Fetal Wellbeing: Category I  Pain Control: Epidural  I/D: GBS pos; PCN Q 4 hrs; SROM x 30 hours; Afebrile    Anticipated MOD: NSVD  Sharon Clayton 09/04/2013, 11:15 AM

## 2013-09-04 NOTE — Progress Notes (Signed)
  Subjective: Pt is felling frustration and disappointment, however she feels determined to continue.    Objective: BP 118/65  Pulse 71  Temp(Src) 98.2 F (36.8 C) (Oral)  Resp 18  Ht 5' 1"  (1.549 m)  Wt 170 lb (77.111 kg)  BMI 32.14 kg/m2  SpO2 100%  LMP 12/07/2012 I/O last 3 completed shifts: In: -  Out: 800 [Urine:800] Total I/O In: -  Out: 1500 [Urine:1500]  FHT:  Cat I UC:   regular, every 2-3 minutes  SVE:   Dilation: 8 Effacement (%): 80 Station: -1 Exam by:: SmithRN  Assessment:   Augmentation of labor, progressing well; Pitocin at 18 miliU; MVUs 220-250 Labor: Progressing normally  Preeclampsia: no signs or symptoms of toxicity  Fetal Wellbeing: Category I  Pain Control: Epidural  I/D: GBS pos; PCN Q 4 hrs; SROM; Afebrile  Anticipated MOD: NSVD  Yuval Rubens 09/04/2013, 6:49 PM

## 2013-09-04 NOTE — Progress Notes (Signed)
I was asked by Dr. Raphael Gibney to accompany him to this pt's room. She had voiced the desire to "fire" him and he wanted a witness. A man seated with the pt was very hostile and angry towards MD. Dr. Raphael Gibney explained that he need to hear from the pt at this point of her care that she wanted him fired. The pt stated "yes..I want a different doctor". Dr. Tinnie Gens consulted by Dr. Raphael Gibney after conversation with Randolm Idol, Dr. Harolyn Rutherford agrees to accept care of the pt. Pt now consents to C-section.

## 2013-09-04 NOTE — Progress Notes (Signed)
  Subjective: Pt is comfortable with epidural, pt stated she got some rest through the night. Discussed POC, pt agreed.  Objective: BP 129/76  Pulse 75  Temp(Src) 98 F (36.7 C) (Oral)  Resp 18  Ht 5' 1"  (1.549 m)  Wt 170 lb (77.111 kg)  BMI 32.14 kg/m2  SpO2 100%  LMP 12/07/2012 I/O last 3 completed shifts: In: -  Out: 800 [Urine:800]    FHT:  Cat I UC:   regular, every 2-3 minutes SVE:   Deferred  Assessment:   Augmentation of labor, progressing well  Labor: Progressing normally  Preeclampsia: no signs or symptoms of toxicity  Fetal Wellbeing: Category I  Pain Control: Epidural  I/D: GBS pos; PCN Q 4 hrs; SROM x 26 hours; Afebrile  Anticipated MOD: NSVD  Meela Wareing 09/04/2013, 11:09 AM

## 2013-09-04 NOTE — Progress Notes (Signed)
  Subjective: Pt sleeping.  Recent episode of vomiting, comfortable now.  Objective: BP 126/62  Pulse 65  Temp(Src) 98.1 F (36.7 C) (Oral)  Resp 18  Ht 5' 1"  (1.549 m)  Wt 170 lb (77.111 kg)  BMI 32.14 kg/m2  SpO2 100%  LMP 12/07/2012 I/O last 3 completed shifts: In: -  Out: 800 [Urine:800] Total I/O In: -  Out: 900 [Urine:900]  FHT:  Cat I UC:   regular, every 2-4 minutes  SVE:   Dilation: Lip/rim Effacement (%): 90 Station: -1 Exam by:: Arielys Wandersee  Assessment:   Augmentation of labor, progressing well; Pitocin at 18 miliU; MVUs 200-240 Labor: Progressing normally  Preeclampsia: no signs or symptoms of toxicity  Fetal Wellbeing: Category I  Pain Control: Epidural  I/D: GBS pos; PCN Q 4 hrs; SROM 35 hrs; Afebrile  Anticipated MOD: NSVD  Daily Crate 09/04/2013, 3:35 PM

## 2013-09-04 NOTE — Progress Notes (Signed)
Patient cervix was checked at 2040 and was 8cm dilated by Arville Go the CNM. Patient was told that she did not make any change and was told the risks and benefits of continuing to labor by Lollie Marrow. The baby has a reassuring fetal heart rate tracing at this time. Lollie Marrow did mention that a c-section was a possibility if her labor did not progress. Lollie Marrow told the patient the attending physician on call was Dr. Raphael Gibney this evening and would be the physician performing the c-section if it became necessary. At this point the patient and her significant other became very upset and both stated they didn't want him in her presence. Lollie Marrow called Dr. Raphael Gibney in for a meeting with the patient. House coverage for the evening is Diona Browner RN was also notified and present at the bedside with the patient, as well as Arville Go CNM, and myself were at the bedside with the patient. She requests a different physician at this time.

## 2013-09-04 NOTE — Op Note (Signed)
Sharon Clayton PROCEDURE DATE: 09/04/2013  PREOPERATIVE DIAGNOSES: Intrauterine pregnancy at  71w5dweeks gestation; previous cesarean section; failure to progress: arrest of dilation  POSTOPERATIVE DIAGNOSES: The same  PROCEDURE: Repeat Low Transverse Cesarean Section  SURGEON:  Dr. UVerita Schneiders ASSISTANTS:  Dr. MAllen Norris BLianne Moris PA-S  ANESTHESIOLOGIST: Dr. FRudean Curt INDICATIONS: Sharon BELFIOREis a 23y.o. [id number] 385w5dere for cesarean section secondary to the indications listed under preoperative diagnoses; please see preoperative notes for further details.  The risks of cesarean section were discussed with the patient including but were not limited to: bleeding which may require transfusion or reoperation; infection which may require antibiotics; injury to bowel, bladder, ureters or other surrounding organs; injury to the fetus; need for additional procedures including hysterectomy in the event of a life-threatening hemorrhage; placental abnormalities wth subsequent pregnancies, incisional problems, thromboembolic phenomenon and other postoperative/anesthesia complications.   The patient concurred with the proposed plan, giving informed written consent for the procedure.    FINDINGS:  Viable female infant in cephalic presentation.  Apgars 9 and 9.  Clear amniotic fluid.  Intact placenta, three vessel cord.  Normal uterus, fallopian tubes and ovaries bilaterally. Omental adhesions to the anterior lower uterine segment; these were serially clamped, cut and suture ligated.  ANESTHESIA: Epidural INTRAVENOUS FLUIDS: 1600 ml ESTIMATED BLOOD LOSS: 600 ml URINE OUTPUT:  200 ml SPECIMENS: Placenta sent to L&D COMPLICATIONS: None immediate  PROCEDURE IN DETAIL:  The patient preoperatively received intravenous antibiotics and had sequential compression devices applied to her lower extremities.  She was then taken to the operating room where the epidural anesthesia  was dosed up to surgical level and was found to be adequate. She was then placed in a dorsal supine position with a leftward tilt, and prepped and draped in a sterile manner.  A foley catheter was placed into her bladder and attached to constant gravity.  After an adequate timeout was performed, a Pfannenstiel skin incision was made with scalpel and carried through to the underlying layer of fascia. The fascia was incised in the midline, and this incision was extended bilaterally using the Mayo scissors.  Kocher clamps were applied to the superior aspect of the fascial incision and the underlying rectus muscles were dissected off bluntly. A similar process was carried out on the inferior aspect of the fascial incision. The rectus muscles were separated in the midline bluntly and the peritoneum was entered bluntly.Omental adhesions to the anterior lower uterine segment were noted; these were serially clamped, cut and suture ligated with 0 Vicryl. Attention was turned to the lower uterine segment where a low transverse hysterotomy was made with a scalpel and extended bilaterally bluntly.  The infant was successfully delivered, the cord was clamped and cut and the infant was handed over to awaiting neonatology team. Uterine massage was then administered, and the placenta delivered intact with a three-vessel cord. The uterus was then cleared of clot and debris.  The hysterotomy was closed with 0 Vicryl in a running locked fashion, and an imbricating layer was also placed with 0 Vicryl. The pelvis was cleared of all clot and debris. Hemostasis was confirmed on all surfaces.  The peritoneum and the muscles were reapproximated using 0 Vicryl interrupted stitches. The fascia was then closed using 0 Vicryl in a running fashion.  The subcutaneous layer was irrigated, and 30 ml of 0.5% Marcaine was injected subcutaneously around the incision.  The skin was closed with a 4-0 Vicryl subcuticular  stitch. The patient tolerated the  procedure well. Sponge, lap, instrument and needle counts were correct x 2.  She was taken to the recovery room in stable condition.    Verita Schneiders, MD, [place] Attending [place], [place]

## 2013-09-05 ENCOUNTER — Encounter (HOSPITAL_COMMUNITY): Payer: Self-pay

## 2013-09-05 LAB — CBC
HCT: 29.5 % — ABNORMAL LOW (ref 36.0–46.0)
Hemoglobin: 10.1 g/dL — ABNORMAL LOW (ref 12.0–15.0)
MCH: 28.5 pg (ref 26.0–34.0)
MCHC: 34.2 g/dL (ref 30.0–36.0)
MCV: 83.1 fL (ref 78.0–100.0)
Platelets: 216 10*3/uL (ref 150–400)
RBC: 3.55 MIL/uL — ABNORMAL LOW (ref 3.87–5.11)
RDW: 13.4 % (ref 11.5–15.5)
WBC: 12 10*3/uL — ABNORMAL HIGH (ref 4.0–10.5)

## 2013-09-05 MED ORDER — ONDANSETRON HCL 4 MG/2ML IJ SOLN
4.0000 mg | Freq: Three times a day (TID) | INTRAMUSCULAR | Status: DC | PRN
  Filled 2013-09-05: qty 2

## 2013-09-05 MED ORDER — ZOLPIDEM TARTRATE 5 MG PO TABS: 5.0000 mg | ORAL_TABLET | Freq: Every evening | ORAL | Status: DC | PRN

## 2013-09-05 MED ORDER — IBUPROFEN 600 MG PO TABS
600.0000 mg | ORAL_TABLET | Freq: Four times a day (QID) | ORAL | Status: DC
  Administered 2013-09-05 – 2013-09-07 (×7): 600 mg via ORAL
  Filled 2013-09-05 (×7): qty 1

## 2013-09-05 MED ORDER — NALBUPHINE HCL 10 MG/ML IJ SOLN: 5.0000 mg | INTRAMUSCULAR | Status: DC | PRN

## 2013-09-05 MED ORDER — ACETAMINOPHEN 500 MG PO TABS: 1000.0000 mg | ORAL_TABLET | Freq: Four times a day (QID) | ORAL | Status: DC

## 2013-09-05 MED ORDER — KETOROLAC TROMETHAMINE 30 MG/ML IJ SOLN
INTRAMUSCULAR | Status: AC
  Administered 2013-09-05: 30 mg via INTRAVENOUS
  Filled 2013-09-05: qty 1

## 2013-09-05 MED ORDER — SODIUM CHLORIDE 0.9 % IJ SOLN: 3.0000 mL | INTRAMUSCULAR | Status: DC | PRN

## 2013-09-05 MED ORDER — IBUPROFEN 600 MG PO TABS: 600.0000 mg | ORAL_TABLET | Freq: Four times a day (QID) | ORAL | Status: DC | PRN

## 2013-09-05 MED ORDER — SCOPOLAMINE 1 MG/3DAYS TD PT72
MEDICATED_PATCH | TRANSDERMAL | Status: AC
  Administered 2013-09-05: 1.5 mg via TRANSDERMAL
  Filled 2013-09-05: qty 1

## 2013-09-05 MED ORDER — SIMETHICONE 80 MG PO CHEW
80.0000 mg | CHEWABLE_TABLET | ORAL | Status: DC
  Administered 2013-09-05 – 2013-09-06 (×2): 80 mg via ORAL
  Filled 2013-09-05 (×2): qty 1

## 2013-09-05 MED ORDER — MENTHOL 3 MG MT LOZG: 1.0000 | LOZENGE | OROMUCOSAL | Status: DC | PRN

## 2013-09-05 MED ORDER — NALOXONE HCL 1 MG/ML IJ SOLN
1.0000 ug/kg/h | INTRAVENOUS | Status: DC | PRN
  Filled 2013-09-05: qty 2

## 2013-09-05 MED ORDER — SIMETHICONE 80 MG PO CHEW
80.0000 mg | CHEWABLE_TABLET | ORAL | Status: DC | PRN
  Administered 2013-09-05 (×2): 80 mg via ORAL
  Filled 2013-09-05 (×2): qty 1

## 2013-09-05 MED ORDER — DIPHENHYDRAMINE HCL 50 MG/ML IJ SOLN: 25.0000 mg | INTRAMUSCULAR | Status: DC | PRN

## 2013-09-05 MED ORDER — WITCH HAZEL-GLYCERIN EX PADS: 1.0000 "application " | MEDICATED_PAD | CUTANEOUS | Status: DC | PRN

## 2013-09-05 MED ORDER — FAMOTIDINE 20 MG PO TABS
40.0000 mg | ORAL_TABLET | Freq: Once | ORAL | Status: AC
  Administered 2013-09-05: 20 mg via ORAL
  Filled 2013-09-05: qty 2

## 2013-09-05 MED ORDER — DIPHENHYDRAMINE HCL 50 MG/ML IJ SOLN: 12.5000 mg | INTRAMUSCULAR | Status: DC | PRN

## 2013-09-05 MED ORDER — METOCLOPRAMIDE HCL 5 MG/ML IJ SOLN: 10.0000 mg | Freq: Three times a day (TID) | INTRAMUSCULAR | Status: DC | PRN

## 2013-09-05 MED ORDER — NALOXONE HCL 0.4 MG/ML IJ SOLN: 0.4000 mg | INTRAMUSCULAR | Status: DC | PRN

## 2013-09-05 MED ORDER — ONDANSETRON HCL 4 MG/2ML IJ SOLN
4.0000 mg | INTRAMUSCULAR | Status: DC | PRN
  Administered 2013-09-05: 4 mg via INTRAVENOUS

## 2013-09-05 MED ORDER — LANOLIN HYDROUS EX OINT: 1.0000 "application " | TOPICAL_OINTMENT | CUTANEOUS | Status: DC | PRN

## 2013-09-05 MED ORDER — MAGNESIUM HYDROXIDE 400 MG/5ML PO SUSP: 30.0000 mL | ORAL | Status: DC | PRN

## 2013-09-05 MED ORDER — TETANUS-DIPHTH-ACELL PERTUSSIS 5-2.5-18.5 LF-MCG/0.5 IM SUSP: 0.5000 mL | Freq: Once | INTRAMUSCULAR | Status: DC

## 2013-09-05 MED ORDER — PRENATAL MULTIVITAMIN CH
1.0000 | ORAL_TABLET | Freq: Every day | ORAL | Status: DC
  Administered 2013-09-06: 1 via ORAL
  Filled 2013-09-05 (×2): qty 1

## 2013-09-05 MED ORDER — SENNOSIDES-DOCUSATE SODIUM 8.6-50 MG PO TABS
2.0000 | ORAL_TABLET | ORAL | Status: DC
  Filled 2013-09-05 (×2): qty 2

## 2013-09-05 MED ORDER — DIBUCAINE 1 % RE OINT: 1.0000 "application " | TOPICAL_OINTMENT | RECTAL | Status: DC | PRN

## 2013-09-05 MED ORDER — RANITIDINE HCL 150 MG/10ML PO SYRP: 150.0000 mg | ORAL_SOLUTION | Freq: Once | ORAL | Status: DC

## 2013-09-05 MED ORDER — OXYCODONE-ACETAMINOPHEN 5-325 MG PO TABS
1.0000 | ORAL_TABLET | ORAL | Status: DC | PRN
  Administered 2013-09-06 (×2): 2 via ORAL
  Administered 2013-09-06 – 2013-09-07 (×2): 1 via ORAL
  Filled 2013-09-05 (×3): qty 1
  Filled 2013-09-05: qty 2

## 2013-09-05 MED ORDER — OXYTOCIN 40 UNITS IN LACTATED RINGERS INFUSION - SIMPLE MED: 62.5000 mL/h | INTRAVENOUS | Status: AC

## 2013-09-05 MED ORDER — DIPHENHYDRAMINE HCL 25 MG PO CAPS: 25.0000 mg | ORAL_CAPSULE | ORAL | Status: DC | PRN

## 2013-09-05 MED ORDER — MEASLES, MUMPS & RUBELLA VAC ~~LOC~~ INJ
0.5000 mL | INJECTION | Freq: Once | SUBCUTANEOUS | Status: DC
  Filled 2013-09-05: qty 0.5

## 2013-09-05 MED ORDER — DIPHENHYDRAMINE HCL 25 MG PO CAPS: 25.0000 mg | ORAL_CAPSULE | Freq: Four times a day (QID) | ORAL | Status: DC | PRN

## 2013-09-05 MED ORDER — ONDANSETRON HCL 4 MG PO TABS: 4.0000 mg | ORAL_TABLET | ORAL | Status: DC | PRN

## 2013-09-05 NOTE — Progress Notes (Signed)
Subjective: Postpartum Day 1: Cesarean Delivery Patient reports tolerating PO and + flatus.    Objective: Vital signs in last 24 hours: Temp:  [97.9 F (36.6 C)-98.8 F (37.1 C)] 97.9 F (36.6 C) (03/29 0257) Pulse Rate:  [52-117] 52 (03/29 0520) Resp:  [16-24] 18 (03/29 0520) BP: (97-138)/(61-92) 118/74 mmHg (03/29 0257) SpO2:  [95 %-100 %] 100 % (03/29 0520)  Physical Exam:  General: alert, cooperative and no distress Lochia: appropriate Uterine Fundus: firm Incision: healing well, no significant drainage, no dehiscence, no significant erythema DVT Evaluation: No evidence of DVT seen on physical exam. Negative Homan's sign. No cords or calf tenderness. No significant calf/ankle edema.   Recent Labs  09/03/13 1000 09/05/13 0620  HGB 11.0* 10.1*  HCT 31.9* 29.5*    Assessment/Plan: Status post Cesarean section. Doing well postoperatively.  Continue current care.  Phill Myron 09/05/2013, 7:24 AM  I spoke with and examined patient and agree with resident's note and plan of care.  Fredrik Rigger, MD OB Fellow 09/05/2013 8:04 AM

## 2013-09-05 NOTE — Anesthesia Postprocedure Evaluation (Signed)
  Anesthesia Post-op Note  Patient: Sharon Clayton  Procedure(s) Performed: Procedure(s): CESAREAN SECTION- repeat (N/A)  Patient Location: Mother/Baby  Anesthesia Type:Epidural  Level of Consciousness: awake, alert  and oriented  Airway and Oxygen Therapy: Patient Spontanous Breathing  Post-op Pain: none  Post-op Assessment: Post-op Vital signs reviewed, Patient's Cardiovascular Status Stable, Respiratory Function Stable, No headache, No backache, No residual numbness and No residual motor weakness  Post-op Vital Signs: Reviewed and stable  Complications: No apparent anesthesia complications

## 2013-09-05 NOTE — Lactation Note (Signed)
This note was copied from the chart of Sharon Nichola Sizer. Lactation Consultation Note Initial consult:  Baby Sharon 79 hours old and getting a bath. Reviewed hand expression and basics, LC/OP services and brochure.  Recommend mother breast feeds every 3 hours due to baby's size and call for assistance with latching.  Patient Name: Sharon Clayton Date: 09/05/2013 Reason for consult: Initial assessment   Maternal Data Infant to breast within first hour of birth: Yes Has patient been taught Hand Expression?: Yes Does the patient have breastfeeding experience prior to this delivery?: Yes  Feeding    LATCH Score/Interventions                      Lactation Tools Discussed/Used     Consult Status Consult Status: Follow-up Date: 09/06/13 Follow-up type: In-patient    Vivianne Master [facility] 09/05/2013, 2:12 PM

## 2013-09-05 NOTE — Anesthesia Postprocedure Evaluation (Signed)
  Anesthesia Post Note  Patient: Sharon Clayton  Procedure(s) Performed: Procedure(s) (LRB): CESAREAN SECTION- repeat (N/A)  Anesthesia type: Epidural  Patient location: PACU  Post pain: Pain level controlled  Post assessment: Post-op Vital signs reviewed  Last Vitals:  Filed Vitals:   09/04/13 2207  BP:   Pulse:   Temp:   Resp: 20    Post vital signs: Reviewed  Level of consciousness: awake  Complications: No apparent anesthesia complications

## 2013-09-05 NOTE — Addendum Note (Signed)
Addendum created 09/05/13 0855 by Jonna Munro, CRNA   Modules edited: Notes Section   Notes Section:  File: [id number]

## 2013-09-06 ENCOUNTER — Encounter (HOSPITAL_COMMUNITY): Payer: Self-pay | Admitting: Obstetrics & Gynecology

## 2013-09-06 NOTE — Progress Notes (Signed)
Subjective: Postpartum Day 2: Cesarean Delivery Patient reports incisional pain, tolerating PO, + flatus and no problems voiding.    Objective: Vital signs in last 24 hours: Temp:  [97.3 F (36.3 C)-98.5 F (36.9 C)] 97.7 F (36.5 C) (03/30 0621) Pulse Rate:  [54-65] 56 (03/30 0621) Resp:  [18-20] 18 (03/30 0621) BP: (97-148)/(62-86) 97/68 mmHg (03/30 0621) SpO2:  [97 %-100 %] 100 % (03/29 2100)  Physical Exam:  General: alert, cooperative, appears stated age and no distress Lochia: appropriate Uterine Fundus: firm Incision: healing well, no significant drainage, no dehiscence DVT Evaluation: No evidence of DVT seen on physical exam. Negative Homan's sign. No cords or calf tenderness.   Recent Labs  09/03/13 1000 09/05/13 0620  HGB 11.0* 10.1*  HCT 31.9* 29.5*    Assessment/Plan: Status post Cesarean section. Doing well postoperatively.  Continue current care.  Sharon Clayton 09/06/2013, 6:50 AM

## 2013-09-06 NOTE — Lactation Note (Signed)
This note was copied from the chart of Sharon Clayton. Lactation Consultation Note  Patient Name: Sharon Clayton [id number] Date: 09/06/2013 Reason for consult: Follow-up assessment Per mom the baby has been kinda sleepy this afternoon . LC reviewed  Basics , breast massage , hand express, and positioning.  Baby latched , assisted with depth , LC and mom noticed baby  Intermittently pulling back on the base nipples,although stays latched with depth .  Per mom nipples have been tender and using comfort gels , but did not complain  Of areola soreness. [place] asked mom if she had noticed the baby pulling back . Per mom Mentioned yes , and I just let her do it . LC suggested taking control of the latch and not allowing her to pull back. If she has to un-latch and relatch. Also breast massage , hand express, and see if that helps.     Maternal Data Formula Feeding for Exclusion: No  Feeding Feeding Type: Breast Fed Length of feed:  (still feeding at 1535 )  LATCH Score/Interventions Latch: Grasps breast easily, tongue down, lips flanged, rhythmical sucking. Intervention(s): Skin to skin;Teach feeding cues;Waking techniques Intervention(s): Adjust position;Assist with latch;Breast massage;Breast compression  Audible Swallowing: Spontaneous and intermittent  Type of Nipple: Everted at rest and after stimulation  Comfort (Breast/Nipple): Soft / non-tender     Hold (Positioning): Assistance needed to correctly position infant at breast and maintain latch. Intervention(s): Breastfeeding basics reviewed;Support Pillows;Position options;Skin to skin  LATCH Score: 9  Lactation Tools Discussed/Used Tools: Pump Breast pump type: Manual (givent to mom by the MBU RN ) [place] Program: No (per mom )   Consult Status Consult Status: Follow-up Date: 09/07/13 Follow-up type: In-patient    Myer Haff 09/06/2013, 3:57 PM

## 2013-09-06 NOTE — Progress Notes (Signed)
Ur chart review completed.  

## 2013-09-07 MED ORDER — IBUPROFEN 600 MG PO TABS: 600.0000 mg | ORAL_TABLET | Freq: Four times a day (QID) | ORAL | Status: DC

## 2013-09-07 MED ORDER — OXYCODONE-ACETAMINOPHEN 5-325 MG PO TABS: 1.0000 | ORAL_TABLET | ORAL | Status: DC | PRN

## 2013-09-07 NOTE — Lactation Note (Signed)
This note was copied from the chart of Girl Nichola Sizer. Lactation Consultation Note  Patient Name: Girl Bronda Alfred [id number] Date: 09/07/2013 Reason for consult: Follow-up assessment;Infant < 6lbs Assisted Mom with positioning to obtain more depth with latch. Mom has positional stripes bilateral. Care for sore nipples reviewed, Mom has comfort gels. Swallowing noted with baby at the breast. Encouraged Mom to post pump with feedings and give baby back any amount of EBM available. Mom prefers to use bottle. Advised Mom baby should be actively nursing for 15-20 minutes both breasts when possible. If baby not waking to BF or staying awake at the breast, she may need to start supplements. Engorgement care reviewed if needed. Advised of OP services and support group.   Maternal Data    Feeding Feeding Type: Breast Fed Length of feed: 20 min  LATCH Score/Interventions Latch: Grasps breast easily, tongue down, lips flanged, rhythmical sucking. Intervention(s): Assist with latch;Breast compression;Adjust position;Breast massage  Audible Swallowing: Spontaneous and intermittent  Type of Nipple: Everted at rest and after stimulation  Comfort (Breast/Nipple): Filling, red/small blisters or bruises, mild/mod discomfort  Problem noted: Mild/Moderate discomfort;Cracked, bleeding, blisters, bruises Interventions  (Cracked/bleeding/bruising/blister): Expressed breast milk to nipple;Lanolin Interventions (Mild/moderate discomfort): Comfort gels  Hold (Positioning): Assistance needed to correctly position infant at breast and maintain latch.  LATCH Score: 8  Lactation Tools Discussed/Used Tools: Lanolin;Pump;Comfort gels Breast pump type: Manual   Consult Status Consult Status: Complete Date: 09/07/13 Follow-up type: In-patient    Katrine Coho 09/07/2013, 9:05 AM

## 2013-09-07 NOTE — Discharge Summary (Signed)
Attestation of Attending Supervision of Obstetric Fellow: Evaluation and management procedures were performed by the Obstetric Fellow under my supervision and collaboration.  I have reviewed the Obstetric Fellow's note and chart, and I agree with the management and plan.  Verita Schneiders, MD, [place] Attending [place], [place]

## 2013-09-07 NOTE — Discharge Summary (Signed)
Obstetric Discharge Summary Reason for Admission: onset of labor Prenatal Procedures: none Intrapartum Procedures: cesarean: low cervical, transverse Postpartum Procedures: none Complications-Operative and Postpartum: none  Hospital Course: Sharon Clayton is a 23y.o. Female [id number] who failed TOLAC, and had a RLTCS without complications. Pt transferred care from [place] during labor and moved to section. She is breast feeding, and is undecided on [place]. She is tolerating PO, ambulating, and voiding without issue. She states that her periods are slightly heavier than a period. She reports some mild incisional pain. See op note for details.    H/H: Lab Results  Component Value Date/Time   HGB 10.1* 09/05/2013  6:20 AM   HCT 29.5* 09/05/2013  6:20 AM    Physical Exam: Abd: Appropriately tender, ND Incision: Healing well.  No c/c/e, Neg homan's sign, neg cords Lochia Appropriate  Discharge Diagnoses: Term Pregnancy-delivered  Discharge Information: Date: 12/20/2010 Activity: pelvic rest Diet: routine  Medications: None Breast feeding:  Yes Condition: stable Instructions: refer to handout Discharge to: home      Medication List         cetirizine 10 MG tablet  Commonly known as:  ZYRTEC  Take 10 mg by mouth at bedtime.     ibuprofen 600 MG tablet  Commonly known as:  ADVIL,MOTRIN  Take 1 tablet (600 mg total) by mouth every 6 (six) hours.     oxyCODONE-acetaminophen 5-325 MG per tablet  Commonly known as:  PERCOCET/ROXICET  Take 1-2 tablets by mouth every 4 (four) hours as needed for severe pain (moderate - severe pain).     PRENATAL/IRON PO  Take 1 tablet by mouth daily.       Follow-up Information   Follow up with [facility]. Schedule an appointment as soon as possible for a visit in 4 weeks.   Specialty:  Obstetrics and Gynecology   Contact information:   [place] [address] [phone]      Fredrik Rigger 09/07/2013,8:30 AM    I spoke with and examined patient and agree with PA-S's note and plan of care.  Fredrik Rigger, MD Ob Fellow 09/07/2013 8:30 AM

## 2013-09-07 NOTE — Discharge Instructions (Signed)
Postpartum Care After Cesarean Delivery °After you deliver your newborn (postpartum period), the usual stay in the hospital is 24 72 hours. If there were problems with your labor or delivery, or if you have other medical problems, you might be in the hospital longer.  °While you are in the hospital, you will receive help and instructions on how to care for yourself and your newborn during the postpartum period.  °While you are in the hospital: °· It is normal for you to have pain or discomfort from the incision in your abdomen. Be sure to tell your nurses when you are having pain, where the pain is located, and what makes the pain worse. °· If you are breastfeeding, you may feel uncomfortable contractions of your uterus for a couple of weeks. This is normal. The contractions help your uterus get back to normal size. °· It is normal to have some bleeding after delivery. °· For the first 1 3 days after delivery, the flow is red and the amount may be similar to a period. °· It is common for the flow to start and stop. °· In the first few days, you may pass some small clots. Let your nurses know if you begin to pass large clots or your flow increases. °· Do not  flush blood clots down the toilet before having the nurse look at them. °· During the next 3 10 days after delivery, your flow should become more watery and pink or brown-tinged in color. °· Ten to fourteen days after delivery, your flow should be a small amount of yellowish-white discharge. °· The amount of your flow will decrease over the first few weeks after delivery. Your flow may stop in 6 8 weeks. Most women have had their flow stop by 12 weeks after delivery. °· You should change your sanitary pads frequently. °· Wash your hands thoroughly with soap and water for at least 20 seconds after changing pads, using the toilet, or before holding or feeding your newborn. °· Your intravenous (IV) tubing will be removed when you are drinking enough fluids. °· The  urine drainage tube (urinary catheter) that was inserted before delivery may be removed within 6 8 hours after delivery or when feeling returns to your legs. You should feel like you need to empty your bladder within the first 6 8 hours after the catheter has been removed. °· In case you become weak, lightheaded, or faint, call your nurse before you get out of bed for the first time and before you take a shower for the first time. °· Within the first few days after delivery, your breasts may begin to feel tender and full. This is called engorgement. Breast tenderness usually goes away within 48 72 hours after engorgement occurs. You may also notice milk leaking from your breasts. If you are not breastfeeding, do not stimulate your breasts. Breast stimulation can make your breasts produce more milk. °· Spending as much time as possible with your newborn is very important. During this time, you and your newborn can feel close and get to know each other. Having your newborn stay in your room (rooming in) will help to strengthen the bond with your newborn. It will give you time to get to know your newborn and become comfortable caring for your newborn. °· Your hormones change after delivery. Sometimes the hormone changes can temporarily cause you to feel sad or tearful. These feelings should not last more than a few days. If these feelings last longer   than that, you should talk to your caregiver. °· If desired, talk to your caregiver about methods of family planning or contraception. °· Talk to your caregiver about immunizations. Your caregiver may want you to have the following immunizations before leaving the hospital: °· Tetanus, diphtheria, and pertussis (Tdap) or tetanus and diphtheria (Td) immunization. It is very important that you and your family (including grandparents) or others caring for your newborn are up-to-date with the Tdap or Td immunizations. The Tdap or Td immunization can help protect your newborn  from getting ill. °· Rubella immunization. °· Varicella (chickenpox) immunization. °· Influenza immunization. You should receive this annual immunization if you did not receive the immunization during your pregnancy. °Document Released: 02/19/2012 Document Reviewed: 02/19/2012 °[employer]® Patient Information ©2014 [employer]. ° °

## 2013-09-08 ENCOUNTER — Inpatient Hospital Stay (HOSPITAL_COMMUNITY)
Admission: RE | Admit: 2013-09-08 | Payer: Medicaid Other | Source: Ambulatory Visit | Admitting: Obstetrics and Gynecology

## 2013-09-08 SURGERY — Surgical Case
Anesthesia: Regional

## 2013-09-09 ENCOUNTER — Inpatient Hospital Stay (HOSPITAL_COMMUNITY)
Admission: AD | Admit: 2013-09-09 | Discharge: 2013-09-09 | Disposition: A | Discharge status: Home / Self Care | Payer: Medicaid Other | Source: Ambulatory Visit | Attending: Obstetrics & Gynecology | Admitting: Obstetrics & Gynecology

## 2013-09-09 DIAGNOSIS — K589 Irritable bowel syndrome without diarrhea: Secondary | ICD-10-CM | POA: Insufficient documentation

## 2013-09-09 DIAGNOSIS — F17200 Nicotine dependence, unspecified, uncomplicated: Secondary | ICD-10-CM | POA: Insufficient documentation

## 2013-09-09 DIAGNOSIS — R0789 Other chest pain: Secondary | ICD-10-CM | POA: Insufficient documentation

## 2013-09-09 DIAGNOSIS — O9989 Other specified diseases and conditions complicating pregnancy, childbirth and the puerperium: Principal | ICD-10-CM

## 2013-09-09 DIAGNOSIS — O165 Unspecified maternal hypertension, complicating the puerperium: Secondary | ICD-10-CM

## 2013-09-09 DIAGNOSIS — R1013 Epigastric pain: Secondary | ICD-10-CM | POA: Insufficient documentation

## 2013-09-09 DIAGNOSIS — O99893 Other specified diseases and conditions complicating puerperium: Secondary | ICD-10-CM

## 2013-09-09 LAB — COMPREHENSIVE METABOLIC PANEL
ALBUMIN: 2.3 g/dL — AB (ref 3.5–5.2)
ALT: 26 U/L (ref 0–35)
AST: 28 U/L (ref 0–37)
Alkaline Phosphatase: 120 U/L — ABNORMAL HIGH (ref 39–117)
BUN: 9 mg/dL (ref 6–23)
CALCIUM: 9.3 mg/dL (ref 8.4–10.5)
CO2: 24 mEq/L (ref 19–32)
CREATININE: 0.9 mg/dL (ref 0.50–1.10)
Chloride: 107 mEq/L (ref 96–112)
GFR calc Af Amer: >90 mL/min (ref >90)
GFR calc non Af Amer: 89 mL/min — ABNORMAL LOW (ref >90)
Glucose, Bld: 85 mg/dL (ref 70–99)
Potassium: 4.1 mEq/L (ref 3.7–5.3)
Sodium: 143 mEq/L (ref 137–147)
Total Bilirubin: <0.2 mg/dL — ABNORMAL LOW (ref 0.3–1.2)
Total Protein: 6.9 g/dL (ref 6.0–8.3)

## 2013-09-09 LAB — URINALYSIS, ROUTINE W REFLEX MICROSCOPIC
BILIRUBIN URINE: NEGATIVE
Glucose, UA: NEGATIVE mg/dL
HGB URINE DIPSTICK: NEGATIVE
Ketones, ur: NEGATIVE mg/dL
Leukocytes, UA: NEGATIVE
Nitrite: NEGATIVE
PROTEIN: NEGATIVE mg/dL
Specific Gravity, Urine: 1.02 (ref 1.005–1.030)
Urobilinogen, UA: 0.2 mg/dL (ref 0.0–1.0)
pH: 6.5 (ref 5.0–8.0)

## 2013-09-09 LAB — PROTEIN / CREATININE RATIO, URINE
Creatinine, Urine: 166.19 mg/dL
PROTEIN CREATININE RATIO: 0.05 (ref 0.00–0.15)
TOTAL PROTEIN, URINE: 7.5 mg/dL

## 2013-09-09 LAB — CBC
HCT: 29.4 % — ABNORMAL LOW (ref 36.0–46.0)
Hemoglobin: 9.6 g/dL — ABNORMAL LOW (ref 12.0–15.0)
MCH: 27.4 pg (ref 26.0–34.0)
MCHC: 32.7 g/dL (ref 30.0–36.0)
MCV: 84 fL (ref 78.0–100.0)
PLATELETS: 265 10*3/uL (ref 150–400)
RBC: 3.5 MIL/uL — ABNORMAL LOW (ref 3.87–5.11)
RDW: 13.5 % (ref 11.5–15.5)
WBC: 6 10*3/uL (ref 4.0–10.5)

## 2013-09-09 NOTE — MAU Note (Signed)
Pt stated she was pumping and started having a sharp mid epigastric pain that felt like it was pulsating it radiated to her back . Lasted about 10 min and went away. She got concerned and called EMS and they brought her here to MAU

## 2013-09-09 NOTE — MAU Provider Note (Signed)
Attestation of Attending Supervision of Advanced Practitioner (CNM/NP): Evaluation and management procedures were performed by the Advanced Practitioner under my supervision and collaboration.  I have reviewed the Advanced Practitioner's note and chart, and I agree with the management and plan.  HARRAWAY-SMITH, Charlee Squibb 10:28 PM

## 2013-09-09 NOTE — Discharge Instructions (Signed)
If you develop persistent worsening headache that does not improve with Tylenol, or persistent abdominal pain. Also, if you develop vision changes with spots in your vision or blurry vision. Please call your doctor or go directly to the Maternity Admissions Unit for further evaluation.   Preeclampsia and Eclampsia Preeclampsia is a condition of high blood pressure during pregnancy. It can happen at 20 weeks or later in pregnancy. If high blood pressure occurs in the second half of pregnancy with no other symptoms, it is called gestational hypertension and goes away after the baby is born. If any of the symptoms listed below develop with gestational hypertension, it is then called preeclampsia. Eclampsia (convulsions) may follow preeclampsia. This is one of the reasons for regular prenatal checkups. Early diagnosis and treatment are very important to prevent eclampsia. CAUSES  There is no known cause of preeclampsia/eclampsia in pregnancy. There are several known conditions that may put the pregnant woman at risk, such as:  The first pregnancy.  Having preeclampsia in a past pregnancy.  Having lasting (chronic) high blood pressure.  Having multiples (twins, triplets).  Being age 25 or older.  African American ethnic background.  Having kidney disease or diabetes.  Medical conditions such as lupus or blood diseases.  Being overweight (obese). SYMPTOMS   High blood pressure.  Headaches.  Sudden weight gain.  Swelling of hands, face, legs, and feet.  Protein in the urine.  Feeling sick to your stomach (nauseous) and throwing up (vomiting).  Vision problems (blurred or double vision).  Numbness in the face, arms, legs, and feet.  Dizziness.  Slurred speech.  Preeclampsia can cause growth retardation in the fetus.  Separation (abruption) of the placenta.  Not enough fluid in the amniotic sac (oligohydramnios).  Sensitivity to bright lights.  Belly (abdominal)  pain. DIAGNOSIS  If protein is found in the urine in the second half of pregnancy, this is considered preeclampsia. Other symptoms mentioned above may also be present. TREATMENT  It is necessary to treat this.  Your caregiver may prescribe bed rest early in this condition. Plenty of rest and salt restriction may be all that is needed.  Medicines may be necessary to lower blood pressure if the condition does not respond to more conservative measures.  In more severe cases, hospitalization may be needed:  For treatment of blood pressure.  To control fluid retention.  To monitor the baby to see if the condition is causing harm to the baby.  Hospitalization is the best way to treat the first sign of preeclampsia. This is so the mother and baby can be watched closely and blood tests can be done effectively and correctly.  If the condition becomes severe, it may be necessary to induce labor or to remove the infant by surgical means (cesarean section). The best cure for preeclampsia/eclampsia is to deliver the baby. Preeclampsia and eclampsia involve risks to mother and infant. Your caregiver will discuss these risks with you. Together, you can work out the best possible approach to your problems. Make sure you keep your prenatal visits as scheduled. Not keeping appointments could result in a chronic or permanent injury, pain, disability to you, and death or injury to you or your unborn baby. If there is any problem keeping the appointment, you must call to reschedule. HOME CARE INSTRUCTIONS   Keep your prenatal appointments and tests as scheduled.  Tell your caregiver if you have any of the above risk factors.  Get plenty of rest and sleep.  Eat a  balanced diet that is low in salt, and do not add salt to your food.  Avoid stressful situations.  Only take over-the-counter and prescriptions medicines for pain, discomfort, or fever as directed by your caregiver. SEEK IMMEDIATE MEDICAL CARE  IF:   You develop severe swelling anywhere in the body. This usually occurs in the legs.  You gain 05 lb/2.3 kg or more in a week.  You develop a severe headache, dizziness, problems with your vision, or confusion.  You have abdominal pain, nausea, or vomiting.  You have a seizure.  You have trouble moving any part of your body, or you develop numbness or problems speaking.  You have bruising or abnormal bleeding from anywhere in the body.  You develop a stiff neck.  You pass out. MAKE SURE YOU:   Understand these instructions.  Will watch your condition.  Will get help right away if you are not doing well or get worse. Document Released: 05/24/2000 Document Revised: 08/19/2011 Document Reviewed: 01/08/2008 [facility] Patient Information 2014 [place].

## 2013-09-09 NOTE — MAU Provider Note (Signed)
@[email or web address] @  Chief Complaint:  Abdominal Pain   Sharon Clayton is  23 y.o. [id number]. S/p recent C/S (due to failure to progress) on 09/04/13, recently discharged on 09/07/13.    She presents complaining of epigastric abdominal pain. Reports she was sitting down, watching a movie and pumping breast, sudden onset about 4:00pm, dull pulsating pain center of chest 7/10, lasted about 10 minutes, called EMS and pain had resolved while on the phone. Additionally reports a central tightness in chest since discharge from hospital, worse laying flat on back, improved with laying on side. Still taking ibuprofen for abdominal pain after recent C/S. No similar episodes in past. Currently no chest pain, but does endorse mild chest tightness on deep breath.  Found to have elevated BP in MAU >140/90, high of 161/103. Denies any concerns of HTN during pregnancy or recent hospitalization for C/S. On chart review, noted significant bradycardia (30-40s), persistence of bradycardia today with HR 40-50s.  Admits mild shortness of breath and anxiety (during episode earlier, currently none now) Denies associated HA, vision changes, edema, abdominal pain, nausea / vomiting   OB History   Grav Para Term Preterm Abortions TAB SAB Ect Mult Living   2 2 2  0 0 0 0 0 0 2       Past Medical History  Diagnosis Date  . IBS (irritable bowel syndrome)   . H/O varicella 09/10/11  . Low iron 09/10/11  . Yeast infection 09/10/11  . Bacterial infection 09/10/11  . Trichomonas 09/10/10  . Hypotension    PMH: severe IBS (diarrhea)  Past Surgical History  Procedure Laterality Date  . Cesarean section  09/10/11    NRFHT - primary low transverse cesarean section   . Cesarean section N/A 09/04/2013    Procedure: CESAREAN SECTION- repeat;  Surgeon: Osborne Oman, MD;  Location: [facility];  Service: Obstetrics;  Laterality: N/A;    Family History  Problem Relation Age of Onset  . Hypertension Mother   . Hypertension Father      History  Substance Use Topics  . Smoking status: Current Every Day Smoker -- 0.25 packs/day for 8 years    Types: Cigarettes  . Smokeless tobacco: Never Used  . Alcohol Use: No    Allergies:  Allergies  Allergen Reactions  . Lactose Intolerance (Gi) Diarrhea and Nausea And Vomiting  . Flagyl [Metronidazole] Itching and Rash    Pt has used Metrogel without reaction, but doesn't tolerate tablets    Prescriptions prior to admission  Medication Sig Dispense Refill  . cetirizine (ZYRTEC) 10 MG tablet Take 10 mg by mouth at bedtime.      Marland Kitchen ibuprofen (ADVIL,MOTRIN) 600 MG tablet Take 600 mg by mouth every 6 (six) hours.      . Prenatal Multivit-Min-Fe-FA (PRENATAL/IRON PO) Take 1 tablet by mouth daily.      . [DISCONTINUED] ibuprofen (ADVIL,MOTRIN) 600 MG tablet Take 1 tablet (600 mg total) by mouth every 6 (six) hours.  30 tablet  0    Physical Exam   Blood pressure 151/87, pulse 46, temperature 98.3 F (36.8 C), resp. rate 18, last menstrual period 12/07/2012, unknown if currently breastfeeding.  General: General appearance - alert, well appearing, mildly anxious, but comfortable conversational and NAD Chest - clear to auscultation, no wheezes, rales or rhonchi, symmetric air entry, chest wall tenderness reproducible Heart - normal rate, regular rhythm, normal S1, S2, no murmurs, rubs, clicks or gallops Abdomen - soft, generalized lower abd tenderness with intact dressing from recent  C/S, negative Murphy's, firm uterus palpated U-1 Pelvic - not performed Extremities - no edema, redness or tenderness in the calves or thighs, Homan's sign negative bilaterally  Labs: Results for orders placed during the hospital encounter of 09/09/13 (from the past 24 hour(s))  URINALYSIS, ROUTINE W REFLEX MICROSCOPIC   Collection Time    09/09/13  6:20 PM      Result Value Ref Range   Color, Urine YELLOW  YELLOW   APPearance CLEAR  CLEAR   Specific Gravity, Urine 1.020  1.005 - 1.030   pH  6.5  5.0 - 8.0   Glucose, UA NEGATIVE  NEGATIVE mg/dL   Hgb urine dipstick NEGATIVE  NEGATIVE   Bilirubin Urine NEGATIVE  NEGATIVE   Ketones, ur NEGATIVE  NEGATIVE mg/dL   Protein, ur NEGATIVE  NEGATIVE mg/dL   Urobilinogen, UA 0.2  0.0 - 1.0 mg/dL   Nitrite NEGATIVE  NEGATIVE   Leukocytes, UA NEGATIVE  NEGATIVE  PROTEIN / CREATININE RATIO, URINE   Collection Time    09/09/13  6:20 PM      Result Value Ref Range   Creatinine, Urine 166.19     Total Protein, Urine 7.5     PROTEIN CREATININE RATIO 0.05  0.00 - 0.15  CBC   Collection Time    09/09/13  6:24 PM      Result Value Ref Range   WBC 6.0  4.0 - 10.5 K/uL   RBC 3.50 (*) 3.87 - 5.11 MIL/uL   Hemoglobin 9.6 (*) 12.0 - 15.0 g/dL   HCT 29.4 (*) 36.0 - 46.0 %   MCV 84.0  78.0 - 100.0 fL   MCH 27.4  26.0 - 34.0 pg   MCHC 32.7  30.0 - 36.0 g/dL   RDW 13.5  11.5 - 15.5 %   Platelets 265  150 - 400 K/uL  COMPREHENSIVE METABOLIC PANEL   Collection Time    09/09/13  6:24 PM      Result Value Ref Range   Sodium 143  137 - 147 mEq/L   Potassium 4.1  3.7 - 5.3 mEq/L   Chloride 107  96 - 112 mEq/L   CO2 24  19 - 32 mEq/L   Glucose, Bld 85  70 - 99 mg/dL   BUN 9  6 - 23 mg/dL   Creatinine, Ser 0.90  0.50 - 1.10 mg/dL   Calcium 9.3  8.4 - 10.5 mg/dL   Total Protein 6.9  6.0 - 8.3 g/dL   Albumin 2.3 (*) 3.5 - 5.2 g/dL   AST 28  0 - 37 U/L   ALT 26  0 - 35 U/L   Alkaline Phosphatase 120 (*) 39 - 117 U/L   Total Bilirubin <0.2 (*) 0.3 - 1.2 mg/dL   GFR calc non Af Amer 89 (*) >90 mL/min   GFR calc Af Amer >90  >90 mL/min   Imaging Studies:  No results found.   Assessment: Sharon Clayton is a 23 y.o. [id number] s/p recent C/S (09/04/13, after TOLAC with failure to progress) viable female infant, discharged on 09/07/13. Suspect presenting epigastric abdominal pain likely due to MSK-related chest-wall related pain and anxiety, very atypical CP. Found to have persistently elevated BP in MAU initially concerning for potential  post-partum pre-eclampsia (>140/90, high >160/100), without clinical signs / symptoms. - Pre-eclampsia work-up with CMET (unremarkable, normal AST/ALT, nml Cr 0.9), CBC (low Hgb 9.6), Urine Protein:Cr (in and out cath specimen) with normal Pr:Cr 0.05. Without evidence of pre-eclampsia  Plan: - Pre-eclampsia work-up - negative - BP monitoring, improved to 151/87.  Discussed with Dr. Ihor Dow.  No antihypertensive therapy recommended. - Discharge to home with planned close follow-up at [facility] for BP re-check and monitoring for pre-eclampsia (Baby love nurse is sick and does not have an appt) advised on return sign/symptoms of concern  Nobie Putnam, [place], PGY-1 09/09/13 - [phone]

## 2013-09-11 ENCOUNTER — Other Ambulatory Visit: Payer: Self-pay

## 2013-09-11 ENCOUNTER — Encounter (HOSPITAL_COMMUNITY): Payer: Self-pay | Admitting: Family

## 2013-09-11 ENCOUNTER — Inpatient Hospital Stay (HOSPITAL_COMMUNITY)
Admission: AD | Admit: 2013-09-11 | Discharge: 2013-09-11 | Disposition: A | Discharge status: Home / Self Care | Payer: Medicaid Other | Source: Ambulatory Visit | Attending: Family Medicine | Admitting: Family Medicine

## 2013-09-11 DIAGNOSIS — O99893 Other specified diseases and conditions complicating puerperium: Secondary | ICD-10-CM | POA: Insufficient documentation

## 2013-09-11 DIAGNOSIS — K219 Gastro-esophageal reflux disease without esophagitis: Secondary | ICD-10-CM

## 2013-09-11 DIAGNOSIS — O135 Gestational [pregnancy-induced] hypertension without significant proteinuria, complicating the puerperium: Secondary | ICD-10-CM | POA: Insufficient documentation

## 2013-09-11 DIAGNOSIS — Z87891 Personal history of nicotine dependence: Secondary | ICD-10-CM | POA: Insufficient documentation

## 2013-09-11 DIAGNOSIS — R079 Chest pain, unspecified: Secondary | ICD-10-CM | POA: Insufficient documentation

## 2013-09-11 DIAGNOSIS — O165 Unspecified maternal hypertension, complicating the puerperium: Secondary | ICD-10-CM

## 2013-09-11 DIAGNOSIS — O9989 Other specified diseases and conditions complicating pregnancy, childbirth and the puerperium: Secondary | ICD-10-CM

## 2013-09-11 LAB — COMPREHENSIVE METABOLIC PANEL
ALBUMIN: 2.5 g/dL — AB (ref 3.5–5.2)
ALT: 22 U/L (ref 0–35)
AST: 19 U/L (ref 0–37)
Alkaline Phosphatase: 119 U/L — ABNORMAL HIGH (ref 39–117)
BUN: 7 mg/dL (ref 6–23)
CO2: 22 mEq/L (ref 19–32)
Calcium: 9 mg/dL (ref 8.4–10.5)
Chloride: 107 mEq/L (ref 96–112)
Creatinine, Ser: 0.87 mg/dL (ref 0.50–1.10)
GFR calc Af Amer: >90 mL/min (ref >90)
GFR calc non Af Amer: >90 mL/min (ref >90)
Glucose, Bld: 100 mg/dL — ABNORMAL HIGH (ref 70–99)
Potassium: 3.4 mEq/L — ABNORMAL LOW (ref 3.7–5.3)
Sodium: 141 mEq/L (ref 137–147)
Total Bilirubin: 0.2 mg/dL — ABNORMAL LOW (ref 0.3–1.2)
Total Protein: 6.5 g/dL (ref 6.0–8.3)

## 2013-09-11 LAB — CBC
HEMATOCRIT: 28.9 % — AB (ref 36.0–46.0)
Hemoglobin: 9.6 g/dL — ABNORMAL LOW (ref 12.0–15.0)
MCH: 27.6 pg (ref 26.0–34.0)
MCHC: 33.2 g/dL (ref 30.0–36.0)
MCV: 83 fL (ref 78.0–100.0)
PLATELETS: 299 10*3/uL (ref 150–400)
RBC: 3.48 MIL/uL — ABNORMAL LOW (ref 3.87–5.11)
RDW: 13.3 % (ref 11.5–15.5)
WBC: 6.1 10*3/uL (ref 4.0–10.5)

## 2013-09-11 LAB — PROTEIN / CREATININE RATIO, URINE
CREATININE, URINE: 32.18 mg/dL
PROTEIN CREATININE RATIO: 0.12 (ref 0.00–0.15)
Total Protein, Urine: 4 mg/dL

## 2013-09-11 LAB — TROPONIN I: Troponin I: <0.3 ng/mL (ref <0.30)

## 2013-09-11 MED ORDER — OXYCODONE-ACETAMINOPHEN 5-325 MG PO TABS: 2.0000 | ORAL_TABLET | Freq: Once | ORAL | Status: DC | PRN

## 2013-09-11 MED ORDER — NIFEDIPINE 10 MG PO CAPS
20.0000 mg | ORAL_CAPSULE | Freq: Once | ORAL | Status: AC
  Administered 2013-09-11: 20 mg via ORAL
  Filled 2013-09-11: qty 2

## 2013-09-11 MED ORDER — LACTATED RINGERS IV SOLN
INTRAVENOUS | Status: DC
  Administered 2013-09-11: 19:00:00 via INTRAVENOUS

## 2013-09-11 MED ORDER — NIFEDIPINE 10 MG PO CAPS
20.0000 mg | ORAL_CAPSULE | Freq: Once | ORAL | Status: DC
  Filled 2013-09-11: qty 2

## 2013-09-11 MED ORDER — NIFEDIPINE ER OSMOTIC RELEASE 30 MG PO TB24: 30.0000 mg | ORAL_TABLET | Freq: Every day | ORAL | Status: DC

## 2013-09-11 MED ORDER — GI COCKTAIL ~~LOC~~
30.0000 mL | Freq: Three times a day (TID) | ORAL | Status: DC | PRN
  Administered 2013-09-11: 30 mL via ORAL
  Filled 2013-09-11: qty 30

## 2013-09-11 MED ORDER — FAMOTIDINE 20 MG PO TABS: 20.0000 mg | ORAL_TABLET | Freq: Two times a day (BID) | ORAL | Status: DC

## 2013-09-11 NOTE — MAU Note (Addendum)
23 yo, G2P2 s/p C/S on 09/04/13 reports to MAU with c/o dyspnea with exertion and epigastric "dull pulsating" pain radiating to back. Hx significant for IBS. Former smoker, quit just before delivery - 1/2ppd smoker x 10 years.  Patient is breastfeeding.

## 2013-09-11 NOTE — MAU Note (Signed)
Marlou Porch, CNM to see patient per Doree Fudge, CNM.

## 2013-09-11 NOTE — Discharge Instructions (Signed)
Hypertension During Pregnancy Hypertension is also called high blood pressure. It can occur at any time in life and during pregnancy. When you have hypertension, there is extra pressure inside your blood vessels that carry blood from the heart to the rest of your body (arteries). Hypertension during pregnancy can cause problems for you and your baby. Your baby might not weigh as much as it should at birth or might be born early (premature). Very bad cases of hypertension during pregnancy can be life threatening.  Different types of hypertension can occur during pregnancy.   Chronic hypertension. This happens when a woman has hypertension before pregnancy and it continues during pregnancy.  Gestational hypertension. This is when hypertension develops during pregnancy.  Preeclampsia or toxemia of pregnancy. This is a very serious type of hypertension that develops only during pregnancy. It is a disease that affects the whole body (systemic) and can be very dangerous for both mother and baby.  Gestational hypertension and preeclampsia usually go away after your baby is born. Blood pressure generally stabilizes within 6 weeks. Women who have hypertension during pregnancy have a greater chance of developing hypertension later in life or with future pregnancies. RISK FACTORS Some factors make you more likely to develop hypertension during pregnancy. Risk factors include:  Having hypertension before pregnancy.  Having hypertension during a previous pregnancy.  Being overweight.  Being older than 66.  Being pregnant with more than one baby (multiples).  Having diabetes or kidney problems. SIGNS AND SYMPTOMS Chronic and gestational hypertension rarely cause symptoms. Preeclampsia has symptoms, which may include:  Increased protein in your urine. Your health care provider will check for this at every prenatal visit.  Swelling of your hands and face.  Rapid weight gain.  Headaches.  Visual  changes.  Being bothered by light.  Abdominal pain, especially in the right upper area.  Chest pain.  Shortness of breath.  Increased reflexes.  Seizures. Seizures occur with a more severe form of preeclampsia, called eclampsia. DIAGNOSIS   You may be diagnosed with hypertension during a regular prenatal exam. At each visit, tests may include:  Blood pressure checks.  A urine test to check for protein in your urine.  The type of hypertension you are diagnosed with depends on when you developed it. It also depends on your specific blood pressure reading.  Developing hypertension before 20 weeks of pregnancy is consistent with chronic hypertension.  Developing hypertension after 20 weeks of pregnancy is consistent with gestational hypertension.  Hypertension with increased urinary protein is diagnosed as preeclampsia.  Blood pressure measurements that stay above 144 systolic or 818 diastolic are a sign of severe preeclampsia. TREATMENT Treatment for hypertension during pregnancy varies. Treatment depends on the type of hypertension and how serious it is.  If you take medicine for chronic hypertension, you may need to switch medicines.  Drugs called ACE inhibitors should not be taken during pregnancy.  Low-dose aspirin may be suggested for women who have risk factors for preeclampsia.  If you have gestational hypertension, you may need to take a blood pressure medicine that is safe during pregnancy. Your health care provider will recommend the appropriate medicine.  If you have severe preeclampsia, you may need to be in the hospital. Health care providers will watch you and the baby very closely. You also may need to take medicine (magnesium sulfate) to prevent seizures and lower blood pressure.  Sometimes an early delivery is needed. This may be the case if the condition worsens. It would  be done to protect you and the baby. The only cure for preeclampsia is delivery. HOME  CARE INSTRUCTIONS  Schedule and keep all of your regular appointments for prenatal care.  Only take over-the-counter or prescription medicines as directed by your health care provider. Tell your health care provider about all medicines you take.  Eat as little salt as possible.  Get regular exercise.  Do not drink alcohol.  Do not use tobacco products.  Do not drink products with caffeine.  Lie on your left side when resting. SEEK IMMEDIATE MEDICAL CARE IF:  You have severe abdominal pain.  You have sudden swelling in the hands, ankles, or face.  You gain 4 pounds (1.8 kg) or more in 1 week.  You vomit repeatedly.  You have vaginal bleeding.  You do not feel the baby moving as much.  You have a headache.  You have blurred or double vision.  You have muscle twitching or spasms.  You have shortness of breath.  You have blue fingernails and lips.  You have blood in your urine. MAKE SURE YOU:  Understand these instructions.  Will watch your condition.  Will get help right away if you are not doing well or get worse. Document Released: 02/12/2011 Document Revised: 03/17/2013 Document Reviewed: 12/24/2012 [facility] Patient Information 2014 [place], [place].  Gastroesophageal Reflux Disease, Adult Gastroesophageal reflux disease (GERD) happens when acid from your stomach flows up into the esophagus. When acid comes in contact with the esophagus, the acid causes soreness (inflammation) in the esophagus. Over time, GERD may create small holes (ulcers) in the lining of the esophagus. CAUSES   Increased body weight. This puts pressure on the stomach, making acid rise from the stomach into the esophagus.  Smoking. This increases acid production in the stomach.  Drinking alcohol. This causes decreased pressure in the lower esophageal sphincter (valve or ring of muscle between the esophagus and stomach), allowing acid from the stomach into the esophagus.  Late evening  meals and a full stomach. This increases pressure and acid production in the stomach.  A malformed lower esophageal sphincter. Sometimes, no cause is found. SYMPTOMS   Burning pain in the lower part of the mid-chest behind the breastbone and in the mid-stomach area. This may occur twice a week or more often.  Trouble swallowing.  Sore throat.  Dry cough.  Asthma-like symptoms including chest tightness, shortness of breath, or wheezing. DIAGNOSIS  Your caregiver may be able to diagnose GERD based on your symptoms. In some cases, X-rays and other tests may be done to check for complications or to check the condition of your stomach and esophagus. TREATMENT  Your caregiver may recommend over-the-counter or prescription medicines to help decrease acid production. Ask your caregiver before starting or adding any new medicines.  HOME CARE INSTRUCTIONS   Change the factors that you can control. Ask your caregiver for guidance concerning weight loss, quitting smoking, and alcohol consumption.  Avoid foods and drinks that make your symptoms worse, such as:  Caffeine or alcoholic drinks.  Chocolate.  Peppermint or mint flavorings.  Garlic and onions.  Spicy foods.  Citrus fruits, such as oranges, lemons, or limes.  Tomato-based foods such as sauce, chili, salsa, and pizza.  Fried and fatty foods.  Avoid lying down for the 3 hours prior to your bedtime or prior to taking a nap.  Eat small, frequent meals instead of large meals.  Wear loose-fitting clothing. Do not wear anything tight around your waist that causes pressure  on your stomach.  Raise the head of your bed 6 to 8 inches with wood blocks to help you sleep. Extra pillows will not help.  Only take over-the-counter or prescription medicines for pain, discomfort, or fever as directed by your caregiver.  Do not take aspirin, ibuprofen, or other nonsteroidal anti-inflammatory drugs (NSAIDs). SEEK IMMEDIATE MEDICAL CARE IF:    You have pain in your arms, neck, jaw, teeth, or back.  Your pain increases or changes in intensity or duration.  You develop nausea, vomiting, or sweating (diaphoresis).  You develop shortness of breath, or you faint.  Your vomit is green, yellow, black, or looks like coffee grounds or blood.  Your stool is red, bloody, or black. These symptoms could be signs of other problems, such as heart disease, gastric bleeding, or esophageal bleeding. MAKE SURE YOU:   Understand these instructions.  Will watch your condition.  Will get help right away if you are not doing well or get worse. Document Released: 03/06/2005 Document Revised: 08/19/2011 Document Reviewed: 12/14/2010 [facility] Patient Information 2014 [place], [place].   Diet for Gastroesophageal Reflux Disease, Adult Reflux (acid reflux) is when acid from your stomach flows up into the esophagus. When acid comes in contact with the esophagus, the acid causes irritation and soreness (inflammation) in the esophagus. When reflux happens often or so severely that it causes damage to the esophagus, it is called gastroesophageal reflux disease (GERD). Nutrition therapy can help ease the discomfort of GERD. FOODS OR DRINKS TO AVOID OR LIMIT  Smoking or chewing tobacco. Nicotine is one of the most potent stimulants to acid production in the gastrointestinal tract.  Caffeinated and decaffeinated coffee and black tea.  Regular or low-calorie carbonated beverages or energy drinks (caffeine-free carbonated beverages are allowed).   Strong spices, such as black pepper, white pepper, red pepper, cayenne, curry powder, and chili powder.  Peppermint or spearmint.  Chocolate.  High-fat foods, including meats and fried foods. Extra added fats including oils, butter, salad dressings, and nuts. Limit these to less than 8 tsp per day.  Fruits and vegetables if they are not tolerated, such as citrus fruits or tomatoes.  Alcohol.  Any  food that seems to aggravate your condition. If you have questions regarding your diet, call your caregiver or a registered dietitian. OTHER THINGS THAT MAY HELP GERD INCLUDE:   Eating your meals slowly, in a relaxed setting.  Eating 5 to 6 small meals per day instead of 3 large meals.  Eliminating food for a period of time if it causes distress.  Not lying down until 3 hours after eating a meal.  Keeping the head of your bed raised 6 to 9 inches (15 to 23 cm) by using a foam wedge or blocks under the legs of the bed. Lying flat may make symptoms worse.  Being physically active. Weight loss may be helpful in reducing reflux in overweight or obese adults.  Wear loose fitting clothing EXAMPLE MEAL PLAN This meal plan is approximately 2,000 calories based on [email or web address] meal planning guidelines. Breakfast   cup cooked oatmeal.  1 cup strawberries.  1 cup low-fat milk.  1 oz almonds. Snack  1 cup cucumber slices.  6 oz yogurt (made from low-fat or fat-free milk). Lunch  2 slice whole-wheat bread.  2 oz sliced [place].  2 tsp mayonnaise.  1 cup blueberries.  1 cup snap peas. Snack  6 whole-wheat crackers.  1 oz string cheese. Dinner   cup brown rice.  1 cup mixed  veggies.  1 tsp olive oil.  3 oz grilled fish. Document Released: 05/27/2005 Document Revised: 08/19/2011 Document Reviewed: 04/12/2011 [facility] Patient Information 2014 [place], [place].

## 2013-09-11 NOTE — MAU Provider Note (Signed)
Attestation of Attending Supervision of Advanced Practitioner (PA/CNM/NP): Evaluation and management procedures were performed by the Advanced Practitioner under my supervision and collaboration.  I have reviewed the Advanced Practitioner's note and chart, and I agree with the management and plan.  Donnamae Jude, MD [facility] Attending 09/11/2013 8:54 PM

## 2013-09-11 NOTE — MAU Provider Note (Signed)
Chief Complaint: No chief complaint on file.   First Provider Initiated Contact with Patient 09/11/13 1615     SUBJECTIVE HPI: Sharon Clayton is a 23 y.o. G2P2002 at 7 days S/P RLTSC who presents with worsening "chest pain" (but points to epigastric area.) Seen in MAU 2 days ago for same. Elevated BPs noted at that visit. No Hx HTN in pregnancy. Pre-E labs normal. See note. Pt is also concerned because she can feel her pulse in her epigastric area matching her HR and is concerned that this is a sign of a cardiac problem. She states her mother told her the pain is from the heaviness of her breasts from breastfeeding and suggests that she stop.   Denies fever, chills, HA, vision changes, urinary complaints. Has had a few bouts of diarrhea per day C/W her usuall bowel habits due to IBS. Of note: Pt is under great stress due to moving in w/ BF after delivery, getting into a big argument and moving out. Last night was her first night alone.  Reported dyspnea to RN, but denies respiratory complaints to CNM.   Past Medical History  Diagnosis Date  . IBS (irritable bowel syndrome)   . H/O varicella 09/10/11  . Low iron 09/10/11  . Yeast infection 09/10/11  . Bacterial infection 09/10/11  . Trichomonas 09/10/10  . Hypotension    OB History  Gravida Para Term Preterm AB SAB TAB Ectopic Multiple Living  2 2 2  0 0 0 0 0 0 2    # Outcome Date GA Lbr Len/2nd Weight Sex Delivery Anes PTL Lv  2 TRM 09/04/13 [redacted]w[redacted]d 2.591 kg (5 lb 11.4 oz) F LTCS EPI  Y  1 TRM 06/09/09 317w3d3.09 kg (6 lb 13 oz) M LTCS EPI  Y     Comments: [facility] distress[facility]      Past Surgical History  Procedure Laterality Date  . Cesarean section  09/10/11    NRFHT - primary low transverse cesarean section   . Cesarean section N/A 09/04/2013    Procedure: CESAREAN SECTION- repeat;  Surgeon: UgOsborne OmanMD;  [facility];  Service: Obstetrics;  Laterality: N/A;   History   Social History  .  Marital Status: Single    Spouse Name: N/A    Number of Children: N/A  . Years of Education: N/A   Occupational History  . Not on file.   Social History Main Topics  . Smoking status: Former Smoker -- 0.25 packs/day for 8 years    Types: Cigarettes    Quit date: 09/03/2013  . Smokeless tobacco: Never Used  . Alcohol Use: No  . Drug Use: Yes    Special: Marijuana     Comment: Last use was 1 month ago.  . Marland Kitchenexual Activity: Yes    Birth Control/ Protection: None   Other Topics Concern  . Not on file   Social History Narrative  . No narrative on file   No current facility-administered medications on file prior to encounter.   Current Outpatient Prescriptions on File Prior to Encounter  Medication Sig Dispense Refill  . ibuprofen (ADVIL,MOTRIN) 600 MG tablet Take 600 mg by mouth every 6 (six) hours.      . Prenatal Multivit-Min-Fe-FA (PRENATAL/IRON PO) Take 1 tablet by mouth daily.      . cetirizine (ZYRTEC) 10 MG tablet Take 10 mg by mouth at bedtime as needed for allergies.  Allergies  Allergen Reactions  . Lactose Intolerance (Gi) Diarrhea and Nausea And Vomiting  . Flagyl [Metronidazole] Itching and Rash    Pt has used Metrogel without reaction, but doesn't tolerate tablets    ROS: Pertinent items in HPI  OBJECTIVE Blood pressure 133/78, pulse 62, temperature 98.1 F (36.7 C), resp. rate 18, SpO2 100.00%, unknown if currently breastfeeding. Patient Vitals for the past 24 hrs:  BP Temp Pulse Resp SpO2  09/11/13 1917 133/78 mmHg - 62 - -  09/11/13 1900 133/82 mmHg - 52 - -  09/11/13 1845 159/114 mmHg - 53 - -  09/11/13 1832 145/95 mmHg - 50 - -  09/11/13 1816 159/93 mmHg - 51 - -  09/11/13 1801 155/96 mmHg - 52 - -  09/11/13 1756 168/94 mmHg - 53 - -  09/11/13 1731 186/94 mmHg - 59 - -  09/11/13 1716 196/99 mmHg - 51 - -  09/11/13 1701 190/94 mmHg - 49 - -  09/11/13 1646 192/96 mmHg - 47 - -  09/11/13 1638 152/83 mmHg - 47 - -  09/11/13 1615 196/97 mmHg -  53 - -  09/11/13 1608 193/93 mmHg - 52 - -  09/11/13 1524 155/85 mmHg 98.1 F (36.7 C) 56 18 100 %    GENERAL: Well-developed, well-nourished female in no acute distress.  HEENT: Normocephalic HEART: Bradycardic. Normal rhythm. No M/R/G. RESP: normal effort. CTAB.  BREASTS: Engorged bilat. No masses or erythema.  ABDOMEN: Soft, non-tender. Pos BS. Normal pulse palpated in aorta. 1.5 FB wide. No hepatomegaly.  EXTREMITIES: Nontender, 1+ edema NEURO: Alert and oriented SPECULUM EXAM: Deferred. Light lochia.   LAB RESULTS Results for orders placed during the hospital encounter of 09/11/13 (from the past 24 hour(s))  CBC     Status: Abnormal   Collection Time    09/11/13  4:10 PM      Result Value Ref Range   WBC 6.1  4.0 - 10.5 K/uL   RBC 3.48 (*) 3.87 - 5.11 MIL/uL   Hemoglobin 9.6 (*) 12.0 - 15.0 g/dL   HCT 28.9 (*) 36.0 - 46.0 %   MCV 83.0  78.0 - 100.0 fL   MCH 27.6  26.0 - 34.0 pg   MCHC 33.2  30.0 - 36.0 g/dL   RDW 13.3  11.5 - 15.5 %   Platelets 299  150 - 400 K/uL  COMPREHENSIVE METABOLIC PANEL     Status: Abnormal   Collection Time    09/11/13  4:10 PM      Result Value Ref Range   Sodium 141  137 - 147 mEq/L   Potassium 3.4 (*) 3.7 - 5.3 mEq/L   Chloride 107  96 - 112 mEq/L   CO2 22  19 - 32 mEq/L   Glucose, Bld 100 (*) 70 - 99 mg/dL   BUN 7  6 - 23 mg/dL   Creatinine, Ser 0.87  0.50 - 1.10 mg/dL   Calcium 9.0  8.4 - 10.5 mg/dL   Total Protein 6.5  6.0 - 8.3 g/dL   Albumin 2.5 (*) 3.5 - 5.2 g/dL   AST 19  0 - 37 U/L   ALT 22  0 - 35 U/L   Alkaline Phosphatase 119 (*) 39 - 117 U/L   Total Bilirubin 0.2 (*) 0.3 - 1.2 mg/dL   GFR calc non Af Amer >90  >90 mL/min   GFR calc Af Amer >90  >90 mL/min  TROPONIN I     Status: None   Collection  Time    09/11/13  4:10 PM      Result Value Ref Range   Troponin I <0.30  <0.30 ng/mL  PROTEIN / CREATININE RATIO, URINE     Status: None   Collection Time    09/11/13  4:25 PM      Result Value Ref Range   Creatinine,  Urine 32.18     Total Protein, Urine 4     PROTEIN CREATININE RATIO 0.12  0.00 - 0.15    IMAGING No results found.  MAU COURSE CBC, CMET, P:C ratio, GI cocktail.   Pain resolved w/ GI cocktail. BPs consistently severe-range. No evidence of Pre-E. Procardia ordered. Will start IV in case BPs do not decrease adequately w/ procardia. Encouraged pt to pump.   BPs decreased to 130's/80's. Pt relaxed. Feeling much better.   ASSESSMENT 1. Postpartum hypertension   2. GERD (gastroesophageal reflux disease)    PLAN Discharge home in stable condition. Pre-E precautions. Reassured pt that epigastric pain was not R/T breastfeeding, but encouraged her to empty breasts frequently and nurse on demand to prevent engorgement and decrease risk for mastitis.  Pt will be staying w/ her mother for support. [facility] at [facility]. Conversing calmly w/ pt. Encouraged pt and FOB to avoid stressful interactions.      Follow-up Information   Follow up with [facility] On 09/13/2013. (at 0830 for blood pressure check.)    Specialty:  Obstetrics and Gynecology   Contact information:   [place] [address] [phone]      Follow up with [place]. (As needed in emergencies)    Contact information:   [address] [id number] [place] [address] [phone]       Medication List         cetirizine 10 MG tablet  Commonly known as:  ZYRTEC  Take 10 mg by mouth at bedtime as needed for allergies.     famotidine 20 MG tablet  Commonly known as:  PEPCID  Take 1 tablet (20 mg total) by mouth 2 (two) times daily.     HYDROcodone-acetaminophen 5-325 MG per tablet  Commonly known as:  NORCO/VICODIN  Take 1 tablet by mouth every 6 (six) hours as needed for moderate pain.     ibuprofen 600 MG tablet  Commonly known as:  ADVIL,MOTRIN  Take 600 mg by mouth every 6 (six) hours.     NIFEdipine 30 MG 24 hr tablet  Commonly  known as:  PROCARDIA-XL/ADALAT-CC/NIFEDICAL-XL  Take 1 tablet (30 mg total) by mouth daily.     PRENATAL/IRON PO  Take 1 tablet by mouth daily.       Ball, CNM 09/11/2013  8:22 PM

## 2013-09-13 ENCOUNTER — Ambulatory Visit (INDEPENDENT_AMBULATORY_CARE_PROVIDER_SITE_OTHER): Payer: Medicaid Other

## 2013-09-13 VITALS — BP 136/98 | Wt 159.6 lb

## 2013-09-13 DIAGNOSIS — R030 Elevated blood-pressure reading, without diagnosis of hypertension: Secondary | ICD-10-CM

## 2013-09-13 DIAGNOSIS — IMO0001 Reserved for inherently not codable concepts without codable children: Secondary | ICD-10-CM

## 2013-09-13 NOTE — Progress Notes (Signed)
Pt came in for BP check.  BP 136/98.  Pt stated that she was given procardia in MAU in which gave a her a horrible headache and the provider gave her an Rx for procardia as well.  Pt stated that she went to MAU with chest pain and she had also been having elevated pressures since delivery.  Pt also stated that she has not even gotten the procardia filled cause she don't want to take it cause she can not attend to her child.  Per Dr. Roselie Awkward pt needs to have a repeat BP in one week and that BP medication is not needed at this time.  I advised pt to watch out for blurred vision, spots, headaches and to please go to nearest pharmacy to check BP.  Pt agreed.

## 2013-09-20 ENCOUNTER — Ambulatory Visit: Payer: Medicaid Other | Admitting: *Deleted

## 2013-09-20 ENCOUNTER — Encounter (HOSPITAL_COMMUNITY): Payer: Self-pay | Admitting: Obstetrics & Gynecology

## 2013-09-20 VITALS — BP 132/86 | HR 57

## 2013-09-20 DIAGNOSIS — Z0130 Encounter for examination of blood pressure without abnormal findings: Secondary | ICD-10-CM

## 2013-09-20 NOTE — Progress Notes (Signed)
Blood pressure readings called to Dr. Ihor Dow, pt need appointment in 2 weeks with provider and decided on birth control.  Instructed patient to have protected sex until on birth control.

## 2013-09-20 NOTE — Addendum Note (Signed)
Addendum created 09/20/13 0817 by Rudean Curt, MD   Modules edited: Anesthesia Events

## 2013-10-04 ENCOUNTER — Encounter: Payer: Self-pay | Admitting: Obstetrics & Gynecology

## 2013-10-04 ENCOUNTER — Ambulatory Visit (INDEPENDENT_AMBULATORY_CARE_PROVIDER_SITE_OTHER): Payer: Medicaid Other | Admitting: Obstetrics & Gynecology

## 2013-10-04 LAB — POCT PREGNANCY, URINE: PREG TEST UR: NEGATIVE

## 2013-10-04 NOTE — Progress Notes (Signed)
Patient ID: Sharon Clayton, female   DOB: 08-24-1990, 23 y.o.   MRN: [id number] Subjective:     Sharon Clayton is a 23 y.o. female who presents for a postpartum visit. She is 4 weeks postpartum following a low cervical transverse Cesarean section. I have fully reviewed the prenatal and intrapartum course. The delivery was at 41 5/7 gestational weeks. Outcome: repeat cesarean section, low transverse incision. Anesthesia: spinal. Postpartum course has been complicated by gas.  She was seen twice in the MAU for eval post op. Baby's course has been uncomplicated. Bleeding no bleeding. Bowel function is normal. Bladder function is normal. Patient is sexually active. Contraception method is none. Postpartum depression screening: negative.  The following portions of the patient's history were reviewed and updated as appropriate: allergies, current medications, past family history, past medical history, past social history, past surgical history and problem list.  Review of Systems A comprehensive review of systems was negative.   Objective:    BP 118/83  Pulse 72  Temp(Src) 97.7 F (36.5 C) (Oral)  Ht 5' 1"  (1.549 m)  Wt 155 lb (70.308 kg)  BMI 29.30 kg/m2  Breastfeeding? No  General:  alert and no distress           Abdomen: soft, non-tender; bowel sounds normal; no masses,  no organomegaly and incision well healed, clean and dry                          Assessment:     5 weeks postpartum exam. Pap smear not done at today's visit.   Plan:    1. Contraception: none 2. Pt had unprotected intercourse 1 week prev 3. Follow up in: 2 weeks for Mirena or as needed.

## 2013-10-04 NOTE — Patient Instructions (Signed)
Levonorgestrel intrauterine device (IUD) What is this medicine? LEVONORGESTREL IUD (LEE voe nor jes trel) is a contraceptive (birth control) device. The device is placed inside the uterus by a healthcare professional. It is used to prevent pregnancy and can also be used to treat heavy bleeding that occurs during your period. Depending on the device, it can be used for 3 to 5 years. This medicine may be used for other purposes; ask your health care provider or pharmacist if you have questions. COMMON BRAND NAME(S): Mirena, Skyla What should I tell my health care provider before I take this medicine? They need to know if you have any of these conditions: -abnormal Pap smear -cancer of the breast, uterus, or cervix -diabetes -endometritis -genital or pelvic infection now or in the past -have more than one sexual partner or your partner has more than one partner -heart disease -history of an ectopic or tubal pregnancy -immune system problems -IUD in place -liver disease or tumor -problems with blood clots or take blood-thinners -use intravenous drugs -uterus of unusual shape -vaginal bleeding that has not been explained -an unusual or allergic reaction to levonorgestrel, other hormones, silicone, or polyethylene, medicines, foods, dyes, or preservatives -pregnant or trying to get pregnant -breast-feeding How should I use this medicine? This device is placed inside the uterus by a health care professional. Talk to your pediatrician regarding the use of this medicine in children. Special care may be needed. Overdosage: If you think you have taken too much of this medicine contact a poison control center or emergency room at once. NOTE: This medicine is only for you. Do not share this medicine with others. What if I miss a dose? This does not apply. What may interact with this medicine? Do not take this medicine with any of the following  medications: -amprenavir -bosentan -fosamprenavir This medicine may also interact with the following medications: -aprepitant -barbiturate medicines for inducing sleep or treating seizures -bexarotene -griseofulvin -medicines to treat seizures like carbamazepine, ethotoin, felbamate, oxcarbazepine, phenytoin, topiramate -modafinil -pioglitazone -rifabutin -rifampin -rifapentine -some medicines to treat HIV infection like atazanavir, indinavir, lopinavir, nelfinavir, tipranavir, ritonavir -St. John's wort -warfarin This list may not describe all possible interactions. Give your health care provider a list of all the medicines, herbs, non-prescription drugs, or dietary supplements you use. Also tell them if you smoke, drink alcohol, or use illegal drugs. Some items may interact with your medicine. What should I watch for while using this medicine? Visit your doctor or health care professional for regular check ups. See your doctor if you or your partner has sexual contact with others, becomes HIV positive, or gets a sexual transmitted disease. This product does not protect you against HIV infection (AIDS) or other sexually transmitted diseases. You can check the placement of the IUD yourself by reaching up to the top of your vagina with clean fingers to feel the threads. Do not pull on the threads. It is a good habit to check placement after each menstrual period. Call your doctor right away if you feel more of the IUD than just the threads or if you cannot feel the threads at all. The IUD may come out by itself. You may become pregnant if the device comes out. If you notice that the IUD has come out use a backup birth control method like condoms and call your health care provider. Using tampons will not change the position of the IUD and are okay to use during your period. What side effects may I   notice from receiving this medicine? Side effects that you should report to your doctor or  health care professional as soon as possible: -allergic reactions like skin rash, itching or hives, swelling of the face, lips, or tongue -fever, flu-like symptoms -genital sores -high blood pressure -no menstrual period for 6 weeks during use -pain, swelling, warmth in the leg -pelvic pain or tenderness -severe or sudden headache -signs of pregnancy -stomach cramping -sudden shortness of breath -trouble with balance, talking, or walking -unusual vaginal bleeding, discharge -yellowing of the eyes or skin Side effects that usually do not require medical attention (report to your doctor or health care professional if they continue or are bothersome): -acne -breast pain -change in sex drive or performance -changes in weight -cramping, dizziness, or faintness while the device is being inserted -headache -irregular menstrual bleeding within first 3 to 6 months of use -nausea This list may not describe all possible side effects. Call your doctor for medical advice about side effects. You may report side effects to FDA at 1-800-FDA-1088. Where should I keep my medicine? This does not apply. NOTE: This sheet is a summary. It may not cover all possible information. If you have questions about this medicine, talk to your doctor, pharmacist, or health care provider.  2014, Elsevier/Gold Standard. (2011-06-27 13:54:04)  

## 2013-10-18 ENCOUNTER — Telehealth: Payer: Self-pay

## 2013-10-18 ENCOUNTER — Ambulatory Visit: Payer: Self-pay | Admitting: Obstetrics & Gynecology

## 2013-10-18 NOTE — Telephone Encounter (Signed)
Pt. Missed today's appointment for IUD insertion. Called pt. No answer. Left message stating we are sorry you missed your appointment, if you would like to re-schedule please call clinic.

## 2014-02-03 ENCOUNTER — Emergency Department (HOSPITAL_COMMUNITY)
Admission: EM | Admit: 2014-02-03 | Discharge: 2014-02-03 | Disposition: A | Discharge status: Home / Self Care | Payer: Medicaid Other | Attending: Emergency Medicine | Admitting: Emergency Medicine

## 2014-02-03 ENCOUNTER — Encounter (HOSPITAL_COMMUNITY): Payer: Self-pay | Admitting: Emergency Medicine

## 2014-02-03 DIAGNOSIS — O99330 Smoking (tobacco) complicating pregnancy, unspecified trimester: Secondary | ICD-10-CM | POA: Diagnosis not present

## 2014-02-03 DIAGNOSIS — Z79899 Other long term (current) drug therapy: Secondary | ICD-10-CM | POA: Diagnosis not present

## 2014-02-03 DIAGNOSIS — Z8619 Personal history of other infectious and parasitic diseases: Secondary | ICD-10-CM | POA: Insufficient documentation

## 2014-02-03 DIAGNOSIS — Z3490 Encounter for supervision of normal pregnancy, unspecified, unspecified trimester: Secondary | ICD-10-CM

## 2014-02-03 DIAGNOSIS — S5001XA Contusion of right elbow, initial encounter: Secondary | ICD-10-CM

## 2014-02-03 DIAGNOSIS — S0990XA Unspecified injury of head, initial encounter: Secondary | ICD-10-CM | POA: Diagnosis not present

## 2014-02-03 DIAGNOSIS — S300XXA Contusion of lower back and pelvis, initial encounter: Secondary | ICD-10-CM | POA: Diagnosis not present

## 2014-02-03 DIAGNOSIS — W19XXXA Unspecified fall, initial encounter: Secondary | ICD-10-CM

## 2014-02-03 DIAGNOSIS — O9989 Other specified diseases and conditions complicating pregnancy, childbirth and the puerperium: Secondary | ICD-10-CM | POA: Diagnosis not present

## 2014-02-03 DIAGNOSIS — S5000XA Contusion of unspecified elbow, initial encounter: Secondary | ICD-10-CM | POA: Insufficient documentation

## 2014-02-03 DIAGNOSIS — IMO0002 Reserved for concepts with insufficient information to code with codable children: Secondary | ICD-10-CM | POA: Insufficient documentation

## 2014-02-03 DIAGNOSIS — Y9289 Other specified places as the place of occurrence of the external cause: Secondary | ICD-10-CM | POA: Insufficient documentation

## 2014-02-03 DIAGNOSIS — Y9389 Activity, other specified: Secondary | ICD-10-CM | POA: Diagnosis not present

## 2014-02-03 DIAGNOSIS — O99019 Anemia complicating pregnancy, unspecified trimester: Secondary | ICD-10-CM | POA: Diagnosis not present

## 2014-02-03 DIAGNOSIS — W010XXA Fall on same level from slipping, tripping and stumbling without subsequent striking against object, initial encounter: Secondary | ICD-10-CM | POA: Insufficient documentation

## 2014-02-03 DIAGNOSIS — D509 Iron deficiency anemia, unspecified: Secondary | ICD-10-CM | POA: Insufficient documentation

## 2014-02-03 DIAGNOSIS — Z8719 Personal history of other diseases of the digestive system: Secondary | ICD-10-CM | POA: Diagnosis not present

## 2014-02-03 LAB — POC URINE PREG, ED: Preg Test, Ur: POSITIVE — AB

## 2014-02-03 MED ORDER — ACETAMINOPHEN 325 MG PO TABS
650.0000 mg | ORAL_TABLET | Freq: Once | ORAL | Status: AC
  Administered 2014-02-03: 650 mg via ORAL
  Filled 2014-02-03: qty 2

## 2014-02-03 MED ORDER — PRENATAL COMPLETE 14-0.4 MG PO TABS: 1.0000 | ORAL_TABLET | Freq: Every day | ORAL | Status: DC

## 2014-02-03 MED ORDER — ACETAMINOPHEN 500 MG PO TABS: 500.0000 mg | ORAL_TABLET | Freq: Four times a day (QID) | ORAL | Status: DC | PRN

## 2014-02-03 NOTE — ED Provider Notes (Signed)
CSN: [id number]     Arrival date & time 02/03/14  2126 History   First MD Initiated Contact with Patient 02/03/14 2236   This chart was scribed for non-physician practitioner Hazel Sams PA-C working with Virgel Manifold, MD by Rosary Lively, ED scribe. This patient was seen in room [facility]TR4/[facility]PT4 and the patient's care was started at 11:17 PM.    Chief Complaint  Patient presents with  . Fall    tailbone pain  . Back Pain  . Elbow Pain    right  . Headache   HPI HPI Comments:  EARLY ORD is a 23 y.o. female who presents to the Emergency Department complaining of a fall. Pt reports she was on her way in the restroom in a store, and slipped on a puddle of water, and broke her fall with her right elbow and tail bone. Pain is worse in the tailbone area. She also reports a headache. Pt denies significant head injury or loss of consciousness. Pt denies tingling, or numbness. She is also concerned of possible pregnancy. Pt LMP 01/02/2014. Pt denies being on BCP.   Past Medical History  Diagnosis Date  . IBS (irritable bowel syndrome)   . H/O varicella 09/10/11  . Low iron 09/10/11  . Yeast infection 09/10/11  . Bacterial infection 09/10/11  . Trichomonas 09/10/10  . Hypotension    Past Surgical History  Procedure Laterality Date  . Cesarean section  09/10/11    NRFHT - primary low transverse cesarean section   . Cesarean section N/A 09/04/2013    Procedure: CESAREAN SECTION- repeat;  Surgeon: Osborne Oman, MD;  Location: [facility];  Service: Obstetrics;  Laterality: N/A;   Family History  Problem Relation Age of Onset  . Hypertension Mother   . Hypertension Father    History  Substance Use Topics  . Smoking status: Current Every Day Smoker -- 0.10 packs/day for 8 years    Types: Cigarettes  . Smokeless tobacco: Never Used  . Alcohol Use: Yes     Comment: occ   OB History   Grav Para Term Preterm Abortions TAB SAB Ect Mult Living   2 2 2  0 0 0 0 0 0 2     Review of Systems   Musculoskeletal: Positive for back pain. Negative for neck pain.  Neurological: Negative for weakness and numbness.  All other systems reviewed and are negative.     Allergies  Lactose intolerance (gi) and Flagyl  Home Medications   Prior to Admission medications   Medication Sig Start Date End Date Taking? Authorizing Provider  cetirizine (ZYRTEC) 10 MG tablet Take 10 mg by mouth at bedtime as needed for allergies.    Yes Historical Provider, MD  famotidine (PEPCID) 20 MG tablet Take 1 tablet (20 mg total) by mouth 2 (two) times daily. 09/11/13 09/11/14 Yes Manya Silvas, CNM  Prenatal Multivit-Min-Fe-FA (PRENATAL/IRON PO) Take 1 tablet by mouth daily.   Yes Historical Provider, MD   BP 135/67  Pulse 85  Temp(Src) 98.2 F (36.8 C) (Oral)  Resp 20  SpO2 100%  LMP 01/02/2014 Physical Exam  Nursing note and vitals reviewed. Constitutional: She is oriented to person, place, and time. She appears well-developed and well-nourished. No distress.  HENT:  Head: Normocephalic and atraumatic.  No significant swelling or any signs of hematoma. No depressed skull fractures. No Battle sign or raccoon eyes.  Eyes: Conjunctivae and EOM are normal. Pupils are equal, round, and reactive to light.  Neck: Normal range  of motion. Neck supple.  No cervical midline tenderness.  Cardiovascular: Normal rate and regular rhythm.   Pulmonary/Chest: Effort normal and breath sounds normal. No respiratory distress.  Abdominal: Soft. There is no tenderness.  Musculoskeletal: Normal range of motion.       Cervical back: Normal.       Thoracic back: Normal.       Lumbar back: She exhibits tenderness. She exhibits no deformity.       Back:  Normal range of motion of the right elbow. No deformity or swelling. No significant tenderness to palpation. Normal distal strength, pulses and sensation in the hand and fingers.  Neurological: She is alert and oriented to person, place, and time.  Skin: Skin is warm  and dry. No rash noted.  Psychiatric: She has a normal mood and affect. Her behavior is normal.    ED Course  Procedures  DIAGNOSTIC STUDIES: Oxygen Saturation is 100% on RA, normal by my interpretation.  COORDINATION OF CARE: 11:20 PM-patient seen and evaluated. Patient well-appearing no acute distress. Does appear uncomfortable. She has a positive pregnancy test today. At this time we discussed the risks and alternatives to x-rays. She does have significant signs for concerning fractures. He would not likely be any change in treatment for coccyx fracture and given the pregnancy would recommend to withhold x-rays. Patient agrees with this. She will take Tylenol for the pain and followup with an OB/GYN.  Labs Review Labs Reviewed  POC URINE PREG, ED - Abnormal; Notable for the following:    Preg Test, Ur POSITIVE (*)    All other components within normal limits     MDM   Final diagnoses:  Fall, initial encounter  Contusion of coccyx, initial encounter  Contusion of elbow, right, initial encounter  Pregnancy    I personally performed the services described in this documentation, which was scribed in my presence. The recorded information has been reviewed and is accurate.     Martie Lee, PA-C 02/04/14 [phone]

## 2014-02-03 NOTE — ED Notes (Signed)
Per [facility], patient fell at [employer], [employer] supervisor was on scene and cleared her C-spine on scene. Patient c/o back pain and right elbow pain. Patient also reports to EMS her period is late and that she may be pregnant. Patient was ambulatory on scene.

## 2014-02-03 NOTE — Discharge Instructions (Signed)
You were evaluated for injuries after a fall. Your providers do not feel you have any concerning injuries. You were found to have a positive pregnancy test today. Please followup with OB/GYN specialist for continued evaluation and treatment of your pregnancy. Take Tylenol for aches and pains. Return for any changing or worsening symptoms.    Contusion A contusion is a deep bruise. Contusions are the result of an injury that caused bleeding under the skin. The contusion may turn blue, purple, or yellow. Minor injuries will give you a painless contusion, but more severe contusions may stay painful and swollen for a few weeks.  CAUSES  A contusion is usually caused by a blow, trauma, or direct force to an area of the body. SYMPTOMS   Swelling and redness of the injured area.  Bruising of the injured area.  Tenderness and soreness of the injured area.  Pain. DIAGNOSIS  The diagnosis can be made by taking a history and physical exam. An X-ray, CT scan, or MRI may be needed to determine if there were any associated injuries, such as fractures. TREATMENT  Specific treatment will depend on what area of the body was injured. In general, the best treatment for a contusion is resting, icing, elevating, and applying cold compresses to the injured area. Over-the-counter medicines may also be recommended for pain control. Ask your caregiver what the best treatment is for your contusion. HOME CARE INSTRUCTIONS   Put ice on the injured area.  Put ice in a plastic bag.  Place a towel between your skin and the bag.  Leave the ice on for 15-20 minutes, 3-4 times a day, or as directed by your health care provider.  Only take over-the-counter or prescription medicines for pain, discomfort, or fever as directed by your caregiver. Your caregiver may recommend avoiding anti-inflammatory medicines (aspirin, ibuprofen, and naproxen) for 48 hours because these medicines may increase bruising.  Rest the injured  area.  If possible, elevate the injured area to reduce swelling. SEEK IMMEDIATE MEDICAL CARE IF:   You have increased bruising or swelling.  You have pain that is getting worse.  Your swelling or pain is not relieved with medicines. MAKE SURE YOU:   Understand these instructions.  Will watch your condition.  Will get help right away if you are not doing well or get worse. Document Released: 03/06/2005 Document Revised: 06/01/2013 Document Reviewed: 04/01/2011 [facility] Patient Information 2015 [place], [place]. This information is not intended to replace advice given to you by your health care provider. Make sure you discuss any questions you have with your health care provider.    Cryotherapy Cryotherapy is when you put ice on your injury. Ice helps lessen pain and puffiness (swelling) after an injury. Ice works the best when you start using it in the first 24 to 48 hours after an injury. HOME CARE  Put a dry or damp towel between the ice pack and your skin.  You may press gently on the ice pack.  Leave the ice on for no more than 10 to 20 minutes at a time.  Check your skin after 5 minutes to make sure your skin is okay.  Rest at least 20 minutes between ice pack uses.  Stop using ice when your skin loses feeling (numbness).  Do not use ice on someone who cannot tell you when it hurts. This includes small children and people with memory problems (dementia). GET HELP RIGHT AWAY IF:  You have white spots on your skin.  Your skin turns blue or pale.  Your skin feels waxy or hard.  Your puffiness gets worse. MAKE SURE YOU:   Understand these instructions.  Will watch your condition.  Will get help right away if you are not doing well or get worse. Document Released: 11/13/2007 Document Revised: 08/19/2011 Document Reviewed: 01/17/2011 [facility] Patient Information 2015 [place], [place]. This information is not intended to replace advice given to you by your health  care provider. Make sure you discuss any questions you have with your health care provider.    Prenatal Care  WHAT IS PRENATAL CARE?  Prenatal care means health care during your pregnancy, before your baby is born. It is very important to take care of yourself and your baby during your pregnancy by:   Getting early prenatal care. If you know you are pregnant, or think you might be pregnant, call your health care provider as soon as possible. Schedule a visit for a prenatal exam.  Getting regular prenatal care. Follow your health care provider's schedule for blood and other necessary tests. Do not miss appointments.  Doing everything you can to keep yourself and your baby healthy during your pregnancy.  Getting complete care. Prenatal care should include evaluation of the medical, dietary, educational, psychological, and social needs of you and your significant other. The medical and genetic history of your family and the family of your baby's father should be discussed with your health care provider.  Discussing with your health care provider:  Prescription, over-the-counter, and herbal medicines that you take.  Any history of substance abuse, alcohol use, smoking, and illegal drug use.  Any history of domestic abuse and violence.  Immunizations you have received.  Your nutrition and diet.  The amount of exercise you do.  Any environmental and occupational hazards to which you are exposed.  History of sexually transmitted infections for both you and your partner.  Previous pregnancies you have had. WHY IS PRENATAL CARE SO IMPORTANT?  By regularly seeing your health care provider, you help ensure that problems can be identified early so that they can be treated as soon as possible. Other problems might be prevented. Many studies have shown that early and regular prenatal care is important for the health of mothers and their babies.  HOW CAN I TAKE CARE OF MYSELF WHILE I AM  PREGNANT?  Here are ways to take care of yourself and your baby:   Start or continue taking your multivitamin with 400 micrograms (mcg) of folic acid every day.  Get early and regular prenatal care. It is very important to see a health care provider during your pregnancy. Your health care provider will check at each visit to make sure that you and your baby are healthy. If there are any problems, action can be taken right away to help you and your baby.  Eat a healthy diet that includes:  Fruits.  Vegetables.  Foods low in saturated fat.  Whole grains.  Calcium-rich foods, such as milk, yogurt, and hard cheeses.  Drink 6-8 glasses of liquids a day.  Unless your health care provider tells you not to, try to be physically active for 30 minutes, most days of the week. If you are pressed for time, you can get your activity in through 10-minute segments, three times a day.  Do not smoke, drink alcohol, or use drugs. These can cause long-term damage to your baby. Talk with your health care provider about steps to take to stop smoking. Talk with a  member of your faith community, a Social worker, a trusted friend, or your health care provider if you are concerned about your alcohol or drug use.  Ask your health care provider before taking any medicine, even over-the-counter medicines. Some medicines are not safe to take during pregnancy.  Get plenty of rest and sleep.  Avoid hot tubs and saunas during pregnancy.  Do not have X-rays taken unless absolutely necessary and with the recommendation of your health care provider. A lead shield can be placed on your abdomen to protect your baby when X-rays are taken in other parts of your body.  Do not empty the cat litter when you are pregnant. It may contain a parasite that causes an infection called toxoplasmosis, which can cause birth defects. Also, use gloves when working in garden areas used by cats.  Do not eat uncooked or undercooked meats or  fish.  Do not eat soft, mold-ripened cheeses (Brie, Camembert, and chevre) or soft, blue-veined cheese (Danish blue and Roquefort).  Stay away from toxic chemicals like:  Insecticides.  Solvents (some cleaners or paint thinners).  Lead.  Mercury.  Sexual intercourse may continue until the end of the pregnancy, unless you have a medical problem or there is a problem with the pregnancy and your health care provider tells you not to.  Do not wear high-heel shoes, especially during the second half of the pregnancy. You can lose your balance and fall.  Do not take long trips, unless absolutely necessary. Be sure to see your health care provider before going on the trip.  Do not sit in one position for more than 2 hours when on a trip.  Take a copy of your medical records when going on a trip. Know where a hospital is located in the city you are visiting, in case of an emergency.  Most dangerous household products will have pregnancy warnings on their labels. Ask your health care provider about products if you are unsure.  Limit or eliminate your caffeine intake from coffee, tea, sodas, medicines, and chocolate.  Many women continue working through pregnancy. Staying active might help you stay healthier. If you have a question about the safety or the hours you work at your particular job, talk with your health care provider.  Get informed:  Read books.  Watch videos.  Go to childbirth classes for you and your significant other.  Talk with experienced moms.  Ask your health care provider about childbirth education classes for you and your partner. Classes can help you and your partner prepare for the birth of your baby.  Ask about a baby doctor (pediatrician) and methods and pain medicine for labor, delivery, and possible cesarean delivery. HOW OFTEN SHOULD I SEE MY HEALTH CARE PROVIDER DURING PREGNANCY?  Your health care provider will give you a schedule for your prenatal  visits. You will have visits more often as you get closer to the end of your pregnancy. An average pregnancy lasts about 40 weeks.  A typical schedule includes visiting your health care provider:   About once each month during your first 6 months of pregnancy.  Every 2 weeks during the next 2 months.  Weekly in the last month, until the delivery date. Your health care provider will probably want to see you more often if:  You are older than 35 years.  Your pregnancy is high risk because you have certain health problems or problems with the pregnancy, such as:  Diabetes.  High blood pressure.  The baby  is not growing on schedule, according to the dates of the pregnancy. Your health care provider will do special tests to make sure you and your baby are not having any serious problems. WHAT HAPPENS DURING PRENATAL VISITS?   At your first prenatal visit, your health care provider will do a physical exam and talk to you about your health history and the health history of your partner and your family. Your health care provider will be able to tell you what date to expect your baby to be born on.  Your first physical exam will include checks of your blood pressure, measurements of your height and weight, and an exam of your pelvic organs. Your health care provider will do a Pap test if you have not had one recently and will do cultures of your cervix to make sure there is no infection.  At each prenatal visit, there will be tests of your blood, urine, blood pressure, weight, and the progress of the baby will be checked.  At your later prenatal visits, your health care provider will check how you are doing and how your baby is developing. You may have a number of tests done as your pregnancy progresses.  Ultrasound exams are often used to check on your baby's growth and health.  You may have more urine and blood tests, as well as special tests, if needed. These may include amniocentesis to  examine fluid in the pregnancy sac, stress tests to check how the baby responds to contractions, or a biophysical profile to measure your baby's well-being. Your health care provider will explain the tests and why they are necessary.  You should be tested for high blood sugar (gestational diabetes) between the 24th and 28th weeks of your pregnancy.  You should discuss with your health care provider your plans to breastfeed or bottle-feed your baby.  Each visit is also a chance for you to learn about staying healthy during pregnancy and to ask questions. Document Released: 05/30/2003 Document Revised: 06/01/2013 Document Reviewed: 08/11/2013 [facility] Patient Information 2015 [place], [place]. This information is not intended to replace advice given to you by your health care provider. Make sure you discuss any questions you have with your health care provider.

## 2014-02-03 NOTE — ED Notes (Signed)
Patient states she fell at [employer] this evening, states she struck her head on the floor. Patient c/o head pain, tailbone pain, and back pain. Patient initially c/o R elbow pain but states this is mostly resolved.

## 2014-02-04 NOTE — ED Provider Notes (Signed)
Medical screening examination/treatment/procedure(s) were performed by non-physician practitioner and as supervising physician I was immediately available for consultation/collaboration.   EKG Interpretation None       Virgel Manifold, MD 02/04/14 [phone]

## 2014-02-08 ENCOUNTER — Ambulatory Visit: Payer: Self-pay | Admitting: Gastroenterology

## 2014-04-11 ENCOUNTER — Other Ambulatory Visit (INDEPENDENT_AMBULATORY_CARE_PROVIDER_SITE_OTHER): Payer: Self-pay

## 2014-04-11 ENCOUNTER — Encounter: Payer: Self-pay | Admitting: Gastroenterology

## 2014-04-11 ENCOUNTER — Ambulatory Visit (INDEPENDENT_AMBULATORY_CARE_PROVIDER_SITE_OTHER): Payer: Self-pay | Admitting: Gastroenterology

## 2014-04-11 VITALS — BP 100/60 | HR 60 | Ht 61.0 in | Wt 169.0 lb

## 2014-04-11 DIAGNOSIS — K529 Noninfective gastroenteritis and colitis, unspecified: Secondary | ICD-10-CM

## 2014-04-11 LAB — COMPREHENSIVE METABOLIC PANEL
ALT: 20 U/L (ref 0–35)
AST: 19 U/L (ref 0–37)
Albumin: 2.9 g/dL — ABNORMAL LOW (ref 3.5–5.2)
Alkaline Phosphatase: 91 U/L (ref 39–117)
BUN: 9 mg/dL (ref 6–23)
CHLORIDE: 105 meq/L (ref 96–112)
CO2: 21 mEq/L (ref 19–32)
CREATININE: 0.9 mg/dL (ref 0.4–1.2)
Calcium: 9.3 mg/dL (ref 8.4–10.5)
GFR: 94.36 mL/min (ref >60.00)
Glucose, Bld: 72 mg/dL (ref 70–99)
Potassium: 4.1 mEq/L (ref 3.5–5.1)
SODIUM: 137 meq/L (ref 135–145)
TOTAL PROTEIN: 7.7 g/dL (ref 6.0–8.3)
Total Bilirubin: 0.2 mg/dL (ref 0.2–1.2)

## 2014-04-11 LAB — CBC WITH DIFFERENTIAL/PLATELET
BASOS ABS: 0 10*3/uL (ref 0.0–0.1)
Basophils Relative: 0.3 % (ref 0.0–3.0)
EOS ABS: 0.1 10*3/uL (ref 0.0–0.7)
Eosinophils Relative: 1.2 % (ref 0.0–5.0)
HCT: 37.2 % (ref 36.0–46.0)
Hemoglobin: 12.2 g/dL (ref 12.0–15.0)
LYMPHS PCT: 23.1 % (ref 12.0–46.0)
Lymphs Abs: 2 10*3/uL (ref 0.7–4.0)
MCHC: 32.7 g/dL (ref 30.0–36.0)
MCV: 84.5 fl (ref 78.0–100.0)
Monocytes Absolute: 0.4 10*3/uL (ref 0.1–1.0)
Monocytes Relative: 4.7 % (ref 3.0–12.0)
NEUTROS ABS: 6 10*3/uL (ref 1.4–7.7)
NEUTROS PCT: 70.7 % (ref 43.0–77.0)
Platelets: 402 10*3/uL — ABNORMAL HIGH (ref 150.0–400.0)
RBC: 4.4 Mil/uL (ref 3.87–5.11)
RDW: 15.6 % — AB (ref 11.5–15.5)
WBC: 8.4 10*3/uL (ref 4.0–10.5)

## 2014-04-11 LAB — TSH: TSH: 1.14 u[IU]/mL (ref 0.35–4.50)

## 2014-04-11 LAB — SEDIMENTATION RATE: Sed Rate: 70 mm/hr — ABNORMAL HIGH (ref 0–22)

## 2014-04-11 MED ORDER — MOVIPREP 100 G PO SOLR: 1.0000 | Freq: Once | ORAL | Status: DC

## 2014-04-11 NOTE — Patient Instructions (Addendum)
One of your biggest health concerns is your smoking.  This increases your risk for most cancers and serious cardiovascular diseases such as strokes, heart attacks.  You should try your best to stop.  If you need assistance, please contact your PCP or Smoking Cessation Class at [facility] [phone]) or [place] (1-800-QUIT-NOW). You will have labs checked today in the basement lab.  Please head down after you check out with the front desk  (stool for routine culture, stool for ova parasites), sed rate, tsh, cbc, cmet. You will be set up for a colonoscopy for chronic diarrhea. Try cutting back on gatorade, this can actually contribute to diarrhea at high doses.

## 2014-04-11 NOTE — Progress Notes (Signed)
HPI: This is a     Still having a lot of trouble with diarrhea, several times per day usually.  Can depend on what she eats.    Has tried gluten free and dairy free.  lactaid enzymes previously did not help.   I last saw her March of this  Year. She was 9 months pregnant at the time. I advised her to try to quit smoking, to take Lactaid supplements for her clinical lactose intolerance and to return to see me 3 months to consider further testing, workup.  2 years ago she had an unusual BM that she thought contained a worm.  Has not seen anything like it since then.  Can have diarrhea at night.  Cramping usually proceeds the BMs, no blood.  Just mucous.  Takes imodium periodically, but has given up on it.    Doesn't drink much caffeine.   Past Medical History  Diagnosis Date  . IBS (irritable bowel syndrome)   . H/O varicella 09/10/11  . Low iron 09/10/11  . Yeast infection 09/10/11  . Bacterial infection 09/10/11  . Trichomonas 09/10/10  . Hypotension     Past Surgical History  Procedure Laterality Date  . Cesarean section  09/10/11    NRFHT - primary low transverse cesarean section   . Cesarean section N/A 09/04/2013    Procedure: CESAREAN SECTION- repeat;  Surgeon: Osborne Oman, MD;  Location: [facility];  Service: Obstetrics;  Laterality: N/A;    Current Outpatient Prescriptions  Medication Sig Dispense Refill  . acetaminophen (TYLENOL) 500 MG tablet Take 1 tablet (500 mg total) by mouth every 6 (six) hours as needed. 30 tablet 0  . cetirizine (ZYRTEC) 10 MG tablet Take 10 mg by mouth at bedtime as needed for allergies.     . famotidine (PEPCID) 20 MG tablet Take 1 tablet (20 mg total) by mouth 2 (two) times daily. 30 tablet 1  . norethindrone-ethinyl estradiol (JUNEL FE,GILDESS FE,LOESTRIN FE) 1-20 MG-MCG tablet Take 1 tablet by mouth daily.    . Prenatal Vit-Fe Fumarate-FA (PRENATAL COMPLETE) 14-0.4 MG TABS Take 1 tablet by mouth daily. 60 each 0   No current  facility-administered medications for this visit.    Allergies as of 04/11/2014 - Review Complete 04/11/2014  Allergen Reaction Noted  . Lactose intolerance (gi) Diarrhea and Nausea And Vomiting 01/14/2013  . Flagyl [metronidazole] Itching and Rash 07/03/2013    Family History  Problem Relation Age of Onset  . Hypertension Mother   . Hypertension Father     History   Social History  . Marital Status: Single    Spouse Name: N/A    Number of Children: N/A  . Years of Education: N/A   Occupational History  . Not on file.   Social History Main Topics  . Smoking status: Current Every Day Smoker -- 0.10 packs/day for 8 years    Types: Cigarettes  . Smokeless tobacco: Never Used  . Alcohol Use: Yes     Comment: occ  . Drug Use: Yes    Special: Marijuana     Comment: denies 02/03/14  . Sexual Activity: Yes    Birth Control/ Protection: None   Other Topics Concern  . Not on file   Social History Narrative      Physical Exam: BP 100/60 mmHg  Pulse 60  Ht 5' 1"  (1.549 m)  Wt 169 lb (76.658 kg)  BMI 31.95 kg/m2  LMP 01/03/2014 Constitutional: generally well-appearing Psychiatric: alert and oriented x3 Abdomen:  soft, nontender, nondistended, no obvious ascites, no peritoneal signs, normal bowel sounds     Assessment and plan: 23 y.o. female with chronic diarrhea  I suspect functional, IBS diarrhea predominant. I would like to do stool testing for ova parasites, routine culture, sedimentation rate, CBC, complete blood profile we will proceed with colonoscopy at her soonest convenience as well.

## 2014-04-15 LAB — STOOL CULTURE

## 2014-04-18 NOTE — Patient Instructions (Signed)
Pt has been notified of lab results

## 2014-05-18 ENCOUNTER — Encounter (HOSPITAL_COMMUNITY): Payer: Self-pay | Admitting: *Deleted

## 2014-05-18 ENCOUNTER — Inpatient Hospital Stay (EMERGENCY_DEPARTMENT_HOSPITAL)
Admission: AD | Admit: 2014-05-18 | Discharge: 2014-05-19 | Disposition: A | Discharge status: Home / Self Care | Payer: Medicaid Other | Source: Ambulatory Visit | Attending: Obstetrics & Gynecology | Admitting: Obstetrics & Gynecology

## 2014-05-18 DIAGNOSIS — Z8619 Personal history of other infectious and parasitic diseases: Secondary | ICD-10-CM | POA: Insufficient documentation

## 2014-05-18 DIAGNOSIS — K920 Hematemesis: Secondary | ICD-10-CM | POA: Insufficient documentation

## 2014-05-18 DIAGNOSIS — Z720 Tobacco use: Secondary | ICD-10-CM | POA: Insufficient documentation

## 2014-05-18 DIAGNOSIS — K589 Irritable bowel syndrome without diarrhea: Secondary | ICD-10-CM

## 2014-05-18 DIAGNOSIS — R197 Diarrhea, unspecified: Secondary | ICD-10-CM | POA: Insufficient documentation

## 2014-05-18 DIAGNOSIS — Z79899 Other long term (current) drug therapy: Secondary | ICD-10-CM | POA: Diagnosis not present

## 2014-05-18 DIAGNOSIS — D509 Iron deficiency anemia, unspecified: Secondary | ICD-10-CM | POA: Diagnosis not present

## 2014-05-18 DIAGNOSIS — R1010 Upper abdominal pain, unspecified: Secondary | ICD-10-CM

## 2014-05-18 DIAGNOSIS — N926 Irregular menstruation, unspecified: Secondary | ICD-10-CM

## 2014-05-18 LAB — URINALYSIS, ROUTINE W REFLEX MICROSCOPIC
Bilirubin Urine: NEGATIVE
Glucose, UA: NEGATIVE mg/dL
Hgb urine dipstick: NEGATIVE
Ketones, ur: NEGATIVE mg/dL
Leukocytes, UA: NEGATIVE
NITRITE: NEGATIVE
Protein, ur: NEGATIVE mg/dL
SPECIFIC GRAVITY, URINE: 1.02 (ref 1.005–1.030)
UROBILINOGEN UA: 0.2 mg/dL (ref 0.0–1.0)
pH: 6 (ref 5.0–8.0)

## 2014-05-18 LAB — POCT PREGNANCY, URINE: Preg Test, Ur: NEGATIVE

## 2014-05-18 NOTE — MAU Provider Note (Signed)
History     CSN: [id number]  Arrival date and time: 05/18/14 2319   First Provider Initiated Contact with Patient 05/18/14 2342      Chief Complaint  Patient presents with  . Possible Pregnancy  . Abdominal Pain  . Emesis   HPI Ms. Sharon Clayton is a 23 y.o. [id number] who presents to MAU today with complaint of N/V/D, abdominal pain and hematemesis. Patient states a long history of GI issues and a tentative diagnosis of IBS-D. She has had diarrhea x 5 years. She states that she is scheduled for colonoscopy o 05/31/14. She states that today she is having upper abdominal pain that has been off and on. She states that she noted blood in her vomit today. She denies fever. She denies GYN complaints, however states that she was started on OCPs at the end of October and has had irregular bleeding.   OB History    Gravida Para Term Preterm AB TAB SAB Ectopic Multiple Living   3 2 2  0 1 0 1 0 0 2      Past Medical History  Diagnosis Date  . IBS (irritable bowel syndrome)   . H/O varicella 09/10/11  . Low iron 09/10/11  . Yeast infection 09/10/11  . Bacterial infection 09/10/11  . Trichomonas 09/10/10  . Hypotension     Past Surgical History  Procedure Laterality Date  . Cesarean section  09/10/11    NRFHT - primary low transverse cesarean section   . Cesarean section N/A 09/04/2013    Procedure: CESAREAN SECTION- repeat;  Surgeon: Osborne Oman, MD;  Location: [facility];  Service: Obstetrics;  Laterality: N/A;    Family History  Problem Relation Age of Onset  . Hypertension Mother   . Hypertension Father     History  Substance Use Topics  . Smoking status: Current Every Day Smoker -- 0.10 packs/day for 8 years    Types: Cigarettes  . Smokeless tobacco: Never Used  . Alcohol Use: No     Comment: occ    Allergies:  Allergies  Allergen Reactions  . Lactose Intolerance (Gi) Diarrhea and Nausea And Vomiting  . Flagyl [Metronidazole] Itching and Rash    Pt has used Metrogel  without reaction, but doesn't tolerate tablets    Prescriptions prior to admission  Medication Sig Dispense Refill Last Dose  . alum & mag hydroxide-simeth (MAALOX/MYLANTA) 200-200-20 MG/5ML suspension Take by mouth as needed for indigestion or heartburn.     Marland Kitchen acetaminophen (TYLENOL) 500 MG tablet Take 1 tablet (500 mg total) by mouth every 6 (six) hours as needed. 30 tablet 0 Taking  . cetirizine (ZYRTEC) 10 MG tablet Take 10 mg by mouth at bedtime as needed for allergies.    Taking  . famotidine (PEPCID) 20 MG tablet Take 1 tablet (20 mg total) by mouth 2 (two) times daily. 30 tablet 1 Taking  . MOVIPREP 100 G SOLR Take 1 kit (200 g total) by mouth once. 1 kit 0   . norethindrone-ethinyl estradiol (JUNEL FE,GILDESS FE,LOESTRIN FE) 1-20 MG-MCG tablet Take 1 tablet by mouth daily.   Taking  . Prenatal Vit-Fe Fumarate-FA (PRENATAL COMPLETE) 14-0.4 MG TABS Take 1 tablet by mouth daily. 60 each 0 Taking    Review of Systems  Constitutional: Negative for fever and malaise/fatigue.  Gastrointestinal: Positive for nausea, vomiting, abdominal pain and diarrhea. Negative for constipation.  Genitourinary:       Neg - vaginal bleeding   Physical Exam   Blood pressure  136/77, pulse 73, temperature 98.7 F (37.1 C), temperature source Oral, resp. rate 18, height 5' 1.5" (1.562 m), weight 165 lb 12.8 oz (75.206 kg), last menstrual period 05/02/2014, SpO2 100 %, not currently breastfeeding.  Physical Exam  Constitutional: She is oriented to person, place, and time. She appears well-developed and well-nourished. No distress.  HENT:  Head: Normocephalic.  Respiratory: Effort normal.  GI: Soft. Bowel sounds are normal. She exhibits no distension and no mass. There is tenderness (mild tenderness to palpation of the epigastric region). There is no rebound and no guarding.  Neurological: She is alert and oriented to person, place, and time.  Skin: Skin is warm and dry. No erythema.   Results for  orders placed or performed during the hospital encounter of 05/18/14 (from the past 24 hour(s))  Urinalysis, Routine w reflex microscopic     Status: None   Collection Time: 05/18/14 11:25 PM  Result Value Ref Range   Color, Urine YELLOW YELLOW   APPearance CLEAR CLEAR   Specific Gravity, Urine 1.020 1.005 - 1.030   pH 6.0 5.0 - 8.0   Glucose, UA NEGATIVE NEGATIVE mg/dL   Hgb urine dipstick NEGATIVE NEGATIVE   Bilirubin Urine NEGATIVE NEGATIVE   Ketones, ur NEGATIVE NEGATIVE mg/dL   Protein, ur NEGATIVE NEGATIVE mg/dL   Urobilinogen, UA 0.2 0.0 - 1.0 mg/dL   Nitrite NEGATIVE NEGATIVE   Leukocytes, UA NEGATIVE NEGATIVE  Pregnancy, urine POC     Status: None   Collection Time: 05/18/14 11:41 PM  Result Value Ref Range   Preg Test, Ur NEGATIVE NEGATIVE    MAU Course  Procedures None  MDM UPT - negative UA, CBC, CMP, Amylase and Lipase today.  Since patient is none pregnancy and without GYN concerns. Will confirm hemodynamically stable due to irregular bleeding and then advised immediate evaluation at [facility] after discharge from [facility].  Discussed patient with Dr. Johnney Killian at [facility]. Accepts transfer by private vehicle.   Assessment and Plan  A: Irregular bleeding on OCPs Upper abdominal pain Hematemesis IBS-D  P: Discharge patient to go straight to [facility] for further evaluation of abdominal pain and possible GI consult Vaginal bleeding precautions discussed Patient advised to consult GI prior to starting Iron supplements.  Patient encouraged to follow-up with PCP for management of OCPs if irregular bleeding continues after 3 months of OCPs Patient may return to MAU as needed or if her condition were to change or worsen   Luvenia Redden, PA-C  05/19/2014, 12:10 AM

## 2014-05-18 NOTE — MAU Note (Signed)
Nausea/vomiting since yesterday; vomited blood tonight, was light pink then had streaks of dark red mixed with food. Abdominal pain has been ongoing and is scheduled to be evaluated by GI soon and have colonoscopy on 12/22 for diarrhea x 5 years. Taking birth control, just started new BCP and has had bleeding several times per month.

## 2014-05-19 ENCOUNTER — Encounter (HOSPITAL_COMMUNITY): Payer: Self-pay | Admitting: *Deleted

## 2014-05-19 ENCOUNTER — Emergency Department (HOSPITAL_COMMUNITY)
Admission: EM | Admit: 2014-05-19 | Discharge: 2014-05-19 | Disposition: A | Discharge status: Home / Self Care | Payer: Medicaid Other | Attending: Emergency Medicine | Admitting: Emergency Medicine

## 2014-05-19 DIAGNOSIS — N926 Irregular menstruation, unspecified: Secondary | ICD-10-CM

## 2014-05-19 DIAGNOSIS — K920 Hematemesis: Secondary | ICD-10-CM

## 2014-05-19 DIAGNOSIS — K589 Irritable bowel syndrome without diarrhea: Secondary | ICD-10-CM

## 2014-05-19 DIAGNOSIS — R1010 Upper abdominal pain, unspecified: Secondary | ICD-10-CM

## 2014-05-19 LAB — CBC WITH DIFFERENTIAL/PLATELET
Basophils Absolute: 0 10*3/uL (ref 0.0–0.1)
Basophils Relative: 0 % (ref 0–1)
EOS ABS: 0.1 10*3/uL (ref 0.0–0.7)
Eosinophils Relative: 1 % (ref 0–5)
HCT: 32.4 % — ABNORMAL LOW (ref 36.0–46.0)
Hemoglobin: 10.8 g/dL — ABNORMAL LOW (ref 12.0–15.0)
LYMPHS ABS: 1.8 10*3/uL (ref 0.7–4.0)
Lymphocytes Relative: 30 % (ref 12–46)
MCH: 27.8 pg (ref 26.0–34.0)
MCHC: 33.3 g/dL (ref 30.0–36.0)
MCV: 83.3 fL (ref 78.0–100.0)
Monocytes Absolute: 0.3 10*3/uL (ref 0.1–1.0)
Monocytes Relative: 4 % (ref 3–12)
NEUTROS ABS: 4 10*3/uL (ref 1.7–7.7)
NEUTROS PCT: 65 % (ref 43–77)
Platelets: 300 10*3/uL (ref 150–400)
RBC: 3.89 MIL/uL (ref 3.87–5.11)
RDW: 14.2 % (ref 11.5–15.5)
WBC: 6.1 10*3/uL (ref 4.0–10.5)

## 2014-05-19 LAB — COMPREHENSIVE METABOLIC PANEL
ALK PHOS: 102 U/L (ref 39–117)
ALT: 17 U/L (ref 0–35)
ANION GAP: 12 (ref 5–15)
AST: 16 U/L (ref 0–37)
Albumin: 2.9 g/dL — ABNORMAL LOW (ref 3.5–5.2)
BILIRUBIN TOTAL: 0.2 mg/dL — AB (ref 0.3–1.2)
BUN: 7 mg/dL (ref 6–23)
CHLORIDE: 104 meq/L (ref 96–112)
CO2: 23 mEq/L (ref 19–32)
Calcium: 8.9 mg/dL (ref 8.4–10.5)
Creatinine, Ser: 0.91 mg/dL (ref 0.50–1.10)
GFR, EST NON AFRICAN AMERICAN: 87 mL/min — AB (ref >90)
GLUCOSE: 83 mg/dL (ref 70–99)
Potassium: 4.2 mEq/L (ref 3.7–5.3)
Sodium: 139 mEq/L (ref 137–147)
Total Protein: 7.3 g/dL (ref 6.0–8.3)

## 2014-05-19 LAB — LIPASE, BLOOD: Lipase: 19 U/L (ref 11–59)

## 2014-05-19 LAB — AMYLASE: Amylase: 33 U/L (ref 0–105)

## 2014-05-19 MED ORDER — PRENATAL COMPLETE 14-0.4 MG PO TABS: 1.0000 | ORAL_TABLET | Freq: Every day | ORAL | Status: DC

## 2014-05-19 MED ORDER — OMEPRAZOLE 20 MG PO CPDR: 20.0000 mg | DELAYED_RELEASE_CAPSULE | Freq: Every day | ORAL | Status: DC

## 2014-05-19 NOTE — Discharge Instructions (Signed)
Hematemesis This condition is the vomiting of blood. CAUSES  This can happen if you have a peptic ulcer or an irritation of the throat, stomach, or small bowel. Vomiting over and over again or swallowing blood from a nosebleed, coughing or facial injury can also result in bloody vomit. Anti-inflammatory pain medicines are a common cause of this potentially dangerous condition. The most serious causes of vomiting blood include:  Ulcers (a bacteria called H. pylori is common cause of ulcers).  Clotting problems.  Alcoholism.  Cirrhosis. TREATMENT  Treatment depends on the cause and the severity of the bleeding. Small amounts of blood streaks in the vomit is not the same as vomiting large amounts of bloody or dark, coffee grounds-like material. Weakness, fainting, dehydration, anemia, and continued alcohol or drug use increase the risk. Examination may include blood, vomit, or stool tests. The presence of bloody or dark stool that tests positive for blood (Hemoccult) means the bleeding has been going on for some time. Endoscopy and imaging studies may be done. Emergency treatment may include:  IV medicines or fluids.  Blood transfusions.  Surgery. Hospital care is required for high risk patients or when IV fluids or blood is needed. Upper GI bleeding can cause shock and death if not controlled. HOME CARE INSTRUCTIONS   Your treatment does not require hospital care at this time.  Remain at rest until your condition improves.  Drink clear liquids as tolerated.  Avoid:  Alcohol.  Nicotine.  Aspirin.  Any other anti-inflammatory medicine (ibuprofen, naproxen, and many others).  Medications to suppress stomach acid or vomiting may be needed. Take all your medicine as prescribed.  Be sure to see your caregiver for follow-up as recommended. SEEK IMMEDIATE MEDICAL CARE IF:   You have repeated vomiting, dehydration, fainting, or extreme weakness.  You are vomiting large amounts of  bloody or dark material.  You pass large, dark or bloody stools. Document Released: 07/04/2004 Document Revised: 08/19/2011 Document Reviewed: 07/20/2008 [facility] Patient Information 2015 [place], [place]. This information is not intended to replace advice given to you by your health care provider. Make sure you discuss any questions you have with your health care provider.

## 2014-05-19 NOTE — ED Provider Notes (Signed)
CSN: [id number]     Arrival date & time 05/19/14  0033 History   First MD Initiated Contact with Patient 05/19/14 0221     Chief Complaint  Patient presents with  . Hematemesis     (Consider location/radiation/quality/duration/timing/severity/associated sxs/prior Treatment) Patient is a 23 y.o. female presenting with vomiting. The history is provided by the patient. No language interpreter was used.  Emesis Severity:  Mild Duration:  1 day Number of daily episodes:  3 Associated symptoms: abdominal pain and diarrhea   Associated symptoms: no chills   Associated symptoms comment:  The patient has had a 5-year history of frequent diarrhea and intermittent nausea with infrequent vomiting. Yesterday she experienced nausea with 3 episodes of vomiting bloody emesis described as a small amount of blood mixed with large amount of stomach contents. She had some abdominal cramping but cannot pinpoint where her pain was. It has resolved at this time. No fever. No melena.   Past Medical History  Diagnosis Date  . IBS (irritable bowel syndrome)   . H/O varicella 09/10/11  . Low iron 09/10/11  . Yeast infection 09/10/11  . Bacterial infection 09/10/11  . Trichomonas 09/10/10  . Hypotension    Past Surgical History  Procedure Laterality Date  . Cesarean section  09/10/11    NRFHT - primary low transverse cesarean section   . Cesarean section N/A 09/04/2013    Procedure: CESAREAN SECTION- repeat;  Surgeon: Osborne Oman, MD;  Location: [facility];  Service: Obstetrics;  Laterality: N/A;   Family History  Problem Relation Age of Onset  . Hypertension Mother   . Hypertension Father    History  Substance Use Topics  . Smoking status: Current Every Day Smoker -- 0.10 packs/day for 8 years    Types: Cigarettes  . Smokeless tobacco: Never Used  . Alcohol Use: No     Comment: occ   OB History    Gravida Para Term Preterm AB TAB SAB Ectopic Multiple Living   3 2 2  0 1 0 1 0 0 2     Review of  Systems  Constitutional: Negative for fever and chills.  HENT: Negative.   Respiratory: Negative.   Cardiovascular: Negative.   Gastrointestinal: Positive for nausea, vomiting, abdominal pain and diarrhea.  Musculoskeletal: Negative.   Skin: Negative.   Neurological: Negative.       Allergies  Lactose intolerance (gi) and Flagyl  Home Medications   Prior to Admission medications   Medication Sig Start Date End Date Taking? Authorizing Provider  acetaminophen (TYLENOL) 500 MG tablet Take 1 tablet (500 mg total) by mouth every 6 (six) hours as needed. 02/03/14  Yes Ruthell Rummage Dammen, PA-C  alum & mag hydroxide-simeth (MAALOX/MYLANTA) 200-200-20 MG/5ML suspension Take by mouth as needed for indigestion or heartburn.   Yes Historical Provider, MD  cetirizine (ZYRTEC) 10 MG tablet Take 10 mg by mouth at bedtime as needed for allergies.    Yes Historical Provider, MD  famotidine (PEPCID) 20 MG tablet Take 1 tablet (20 mg total) by mouth 2 (two) times daily. 09/11/13 09/11/14 Yes Manya Silvas, CNM  MOVIPREP 100 G SOLR Take 1 kit (200 g total) by mouth once. 04/11/14   Milus Banister, MD  norethindrone-ethinyl estradiol (JUNEL FE,GILDESS FE,LOESTRIN FE) 1-20 MG-MCG tablet Take 1 tablet by mouth daily.    Historical Provider, MD  Prenatal Vit-Fe Fumarate-FA (PRENATAL COMPLETE) 14-0.4 MG TABS Take 1 tablet by mouth daily. 05/19/14   Luvenia Redden, PA-C   BP  108/64 mmHg  Pulse 67  Temp(Src) 98.8 F (37.1 C) (Oral)  Resp 18  Ht 5' 1"  (1.549 m)  Wt 165 lb (74.844 kg)  BMI 31.19 kg/m2  SpO2 100%  LMP 05/02/2014 Physical Exam  Constitutional: She is oriented to person, place, and time. She appears well-developed and well-nourished.  HENT:  Head: Normocephalic.  Neck: Normal range of motion. Neck supple.  Cardiovascular: Normal rate and regular rhythm.   Pulmonary/Chest: Effort normal and breath sounds normal.  Abdominal: Soft. Bowel sounds are normal. There is no tenderness. There is no  rebound and no guarding.  Musculoskeletal: Normal range of motion.  Neurological: She is alert and oriented to person, place, and time.  Skin: Skin is warm and dry. No rash noted.  Psychiatric: She has a normal mood and affect.    ED Course  Procedures (including critical care time) Labs Review Labs Reviewed - No data to display  Imaging Review No results found.   EKG Interpretation None      MDM   Final diagnoses:  None    1. Hematemesis  VSS, no further vomiting, hgb stable. She has an appointment with GI (Dr. [name]) for later this month and is encouraged to keep that appointment. Return precautions provided.     Dewaine Oats, PA-C 05/19/14 0345  Wynetta Fines, MD 05/19/14 [phone]

## 2014-05-19 NOTE — Discharge Instructions (Signed)
Abdominal Pain, Women °Abdominal (stomach, pelvic, or belly) pain can be caused by many things. It is important to tell your doctor: °· The location of the pain. °· Does it come and go or is it present all the time? °· Are there things that start the pain (eating certain foods, exercise)? °· Are there other symptoms associated with the pain (fever, nausea, vomiting, diarrhea)? °All of this is helpful to know when trying to find the cause of the pain. °CAUSES  °· Stomach: virus or bacteria infection, or ulcer. °· Intestine: appendicitis (inflamed appendix), regional ileitis (Crohn's disease), ulcerative colitis (inflamed colon), irritable bowel syndrome, diverticulitis (inflamed diverticulum of the colon), or cancer of the stomach or intestine. °· Gallbladder disease or stones in the gallbladder. °· Kidney disease, kidney stones, or infection. °· Pancreas infection or cancer. °· Fibromyalgia (pain disorder). °· Diseases of the female organs: °¨ Uterus: fibroid (non-cancerous) tumors or infection. °¨ Fallopian tubes: infection or tubal pregnancy. °¨ Ovary: cysts or tumors. °¨ Pelvic adhesions (scar tissue). °¨ Endometriosis (uterus lining tissue growing in the pelvis and on the pelvic organs). °¨ Pelvic congestion syndrome (female organs filling up with blood just before the menstrual period). °¨ Pain with the menstrual period. °¨ Pain with ovulation (producing an egg). °¨ Pain with an IUD (intrauterine device, birth control) in the uterus. °¨ Cancer of the female organs. °· Functional pain (pain not caused by a disease, may improve without treatment). °· Psychological pain. °· Depression. °DIAGNOSIS  °Your doctor will decide the seriousness of your pain by doing an examination. °· Blood tests. °· X-rays. °· Ultrasound. °· CT scan (computed tomography, special type of X-ray). °· MRI (magnetic resonance imaging). °· Cultures, for infection. °· Barium enema (dye inserted in the large intestine, to better view it with  X-rays). °· Colonoscopy (looking in intestine with a lighted tube). °· Laparoscopy (minor surgery, looking in abdomen with a lighted tube). °· Major abdominal exploratory surgery (looking in abdomen with a large incision). °TREATMENT  °The treatment will depend on the cause of the pain.  °· Many cases can be observed and treated at home. °· Over-the-counter medicines recommended by your caregiver. °· Prescription medicine. °· Antibiotics, for infection. °· Birth control pills, for painful periods or for ovulation pain. °· Hormone treatment, for endometriosis. °· Nerve blocking injections. °· Physical therapy. °· Antidepressants. °· Counseling with a psychologist or psychiatrist. °· Minor or major surgery. °HOME CARE INSTRUCTIONS  °· Do not take laxatives, unless directed by your caregiver. °· Take over-the-counter pain medicine only if ordered by your caregiver. Do not take aspirin because it can cause an upset stomach or bleeding. °· Try a clear liquid diet (broth or water) as ordered by your caregiver. Slowly move to a bland diet, as tolerated, if the pain is related to the stomach or intestine. °· Have a thermometer and take your temperature several times a day, and record it. °· Bed rest and sleep, if it helps the pain. °· Avoid sexual intercourse, if it causes pain. °· Avoid stressful situations. °· Keep your follow-up appointments and tests, as your caregiver orders. °· If the pain does not go away with medicine or surgery, you may try: °¨ Acupuncture. °¨ Relaxation exercises (yoga, meditation). °¨ Group therapy. °¨ Counseling. °SEEK MEDICAL CARE IF:  °· You notice certain foods cause stomach pain. °· Your home care treatment is not helping your pain. °· You need stronger pain medicine. °· You want your IUD removed. °· You feel faint or   lightheaded. °· You develop nausea and vomiting. °· You develop a rash. °· You are having side effects or an allergy to your medicine. °SEEK IMMEDIATE MEDICAL CARE IF:  °· Your  pain does not go away or gets worse. °· You have a fever. °· Your pain is felt only in portions of the abdomen. The right side could possibly be appendicitis. The left lower portion of the abdomen could be colitis or diverticulitis. °· You are passing blood in your stools (bright red or black tarry stools, with or without vomiting). °· You have blood in your urine. °· You develop chills, with or without a fever. °· You pass out. °MAKE SURE YOU:  °· Understand these instructions. °· Will watch your condition. °· Will get help right away if you are not doing well or get worse. °Document Released: 03/24/2007 Document Revised: 10/11/2013 Document Reviewed: 04/13/2009 °[employer]® Patient Information ©2015 [employer]. This information is not intended to replace advice given to you by your health care provider. Make sure you discuss any questions you have with your health care provider. ° °

## 2014-05-19 NOTE — ED Notes (Signed)
At 0400 went to room to discharge pt. Pt not in room. Pt still not in room. No sign of pt belongings in room. No one in near by bathroom. Pt left prior to discharge.

## 2014-05-19 NOTE — ED Notes (Signed)
Pt reports intermittent abd pain x 2 days, started to have hematemesis x 3 for the last 24 hours.  Pt reports hx of IBS.  Pending colonoscopy on the 22nd of this month.

## 2014-05-26 ENCOUNTER — Telehealth: Payer: Self-pay | Admitting: Gastroenterology

## 2014-05-26 NOTE — Telephone Encounter (Signed)
The pt wanted to make Dr [name] aware of her ER visit.  I will forward

## 2014-05-27 NOTE — Telephone Encounter (Signed)
Pt aware.

## 2014-05-27 NOTE — Telephone Encounter (Signed)
Noted, labs tests all essentially normal.    She should continue with plans for colonoscopy next week (tuesday, [place])

## 2014-05-31 ENCOUNTER — Ambulatory Visit (AMBULATORY_SURGERY_CENTER): Payer: Medicaid Other | Admitting: Gastroenterology

## 2014-05-31 ENCOUNTER — Encounter: Payer: Self-pay | Admitting: Gastroenterology

## 2014-05-31 ENCOUNTER — Other Ambulatory Visit: Payer: Medicaid Other

## 2014-05-31 ENCOUNTER — Other Ambulatory Visit: Payer: Self-pay

## 2014-05-31 VITALS — BP 112/67 | HR 61 | Temp 97.9°F | Resp 17 | Ht 61.0 in | Wt 169.0 lb

## 2014-05-31 DIAGNOSIS — K5090 Crohn's disease, unspecified, without complications: Secondary | ICD-10-CM

## 2014-05-31 DIAGNOSIS — K529 Noninfective gastroenteritis and colitis, unspecified: Secondary | ICD-10-CM

## 2014-05-31 DIAGNOSIS — R197 Diarrhea, unspecified: Secondary | ICD-10-CM

## 2014-05-31 MED ORDER — SODIUM CHLORIDE 0.9 % IV SOLN: 500.0000 mL | INTRAVENOUS | Status: DC

## 2014-05-31 MED ORDER — PREDNISONE 10 MG PO TABS: 20.0000 mg | ORAL_TABLET | Freq: Two times a day (BID) | ORAL | Status: DC

## 2014-05-31 NOTE — Op Note (Signed)
[place]  [address]. [place], [id number]   COLONOSCOPY PROCEDURE REPORT  PATIENT: [name], Sharon Clayton  MR#: [id number] BIRTHDATE: Sep 16, 1990 , 23  yrs. old GENDER: female ENDOSCOPIST: Milus Banister, MD PROCEDURE DATE:  05/31/2014 PROCEDURE:   Colonoscopy with biopsy First Screening Colonoscopy - Avg.  risk and is 50 yrs.  old or older - No.  Prior Negative Screening - Now for repeat screening. N/A  History of Adenoma - Now for follow-up colonoscopy & has been > or = to 3 yrs.  N/A  Polyps Removed Today? No.  Recommend repeat exam, <10 yrs? No. ASA CLASS:   Class II INDICATIONS:chronic diarrhea, sed rate 70. MEDICATIONS: Monitored anesthesia care and Propofol 250 mg IV  DESCRIPTION OF PROCEDURE:   After the risks benefits and alternatives of the procedure were thoroughly explained, informed consent was obtained.  The digital rectal exam revealed no abnormalities of the rectum.   The [id number]  endoscope was introduced through the anus and advanced to the terminal ileum which was intubated for a short distance. No adverse events experienced.   The quality of the prep was excellent.  The instrument was then slowly withdrawn as the colon was fully examined.   COLON FINDINGS: There was moderate to severe inflammation in the terminal ileum (to the extent of the exam which was at least the distal most 10-15cm of the TI).  There were multiple, linear ulcers in this region.  Multiple biopsies were taken and sent to pathology.  The colon was completely normal appearing however given the small bowel findings I biospied the right and left colon to check for mild inflammation.  Retroflexed views revealed no abnormalities. The time to cecum=2 minutes 40 seconds.  Withdrawal time=8 minutes 10 seconds.  The scope was withdrawn and the procedure completed. COMPLICATIONS: There were no immediate complications.  ENDOSCOPIC IMPRESSION: There was moderate to  severe inflammation in the terminal ileum (to the extent of the exam which was at least the distal most 10-15cm of the TI).  There were multiple, linear ulcers in this region. Multiple biopsies were taken and sent to pathology.  The colon was completely normal appearing however given the small bowel findings I biospied the right and left colon to check for mild inflammation. 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  RECOMMENDATIONS: Will start on prednisone today.  Take 2 pills twice daily (called into your pharmacy.  Also bloodwork today (TPMT enzyme activity). Return office visit in 2-3 weeks (office to double book if needed).  eSigned:  Milus Banister, MD 05/31/2014 3:21 PM

## 2014-05-31 NOTE — Progress Notes (Signed)
Patient awakening,vss,report to rn 

## 2014-05-31 NOTE — Progress Notes (Signed)
Called to room to assist during endoscopic procedure.  Patient ID and intended procedure confirmed with present staff. Received instructions for my participation in the procedure from the performing physician.  

## 2014-05-31 NOTE — Progress Notes (Signed)
Pt. States she drank approx 6 oz of water at 1330 today.  Per Dr. [name] and Barkley Boards CRNA will do procedure now as scheduled.

## 2014-05-31 NOTE — Patient Instructions (Signed)
YOU HAD AN ENDOSCOPIC PROCEDURE TODAY AT [facility]: Refer to the procedure report that was given to you for any specific questions about what was found during the examination.  If the procedure report does not answer your questions, please call your gastroenterologist to clarify.  If you requested that your care partner not be given the details of your procedure findings, then the procedure report has been included in a sealed envelope for you to review at your convenience later.  YOU SHOULD EXPECT: Some feelings of bloating in the abdomen. Passage of more gas than usual.  Walking can help get rid of the air that was put into your GI tract during the procedure and reduce the bloating. If you had a lower endoscopy (such as a colonoscopy or flexible sigmoidoscopy) you may notice spotting of blood in your stool or on the toilet paper. If you underwent a bowel prep for your procedure, then you may not have a normal bowel movement for a few days.  DIET: Your first meal following the procedure should be a light meal and then it is ok to progress to your normal diet.  A half-sandwich or bowl of soup is an example of a good first meal.  Heavy or fried foods are harder to digest and may make you feel nauseous or bloated.  Likewise meals heavy in dairy and vegetables can cause extra gas to form and this can also increase the bloating.  Drink plenty of fluids but you should avoid alcoholic beverages for 24 hours.  ACTIVITY: Your care partner should take you home directly after the procedure.  You should plan to take it easy, moving slowly for the rest of the day.  You can resume normal activity the day after the procedure however you should NOT DRIVE or use heavy machinery for 24 hours (because of the sedation medicines used during the test).    SYMPTOMS TO REPORT IMMEDIATELY: A gastroenterologist can be reached at any hour.  During normal business hours, 8:30 AM to 5:00 PM Monday through Friday,  call [phone].  After hours and on weekends, please call the GI answering service at [phone] who will take a message and have the physician on call contact you.   Following lower endoscopy (colonoscopy or flexible sigmoidoscopy):  Excessive amounts of blood in the stool  Significant tenderness or worsening of abdominal pains  Swelling of the abdomen that is new, acute  Fever of 100F or higher  FOLLOW UP: If any biopsies were taken you will be contacted by phone or by letter within the next 1-3 weeks.  Call your gastroenterologist if you have not heard about the biopsies in 3 weeks.  Our staff will call the home number listed on your records the next business day following your procedure to check on you and address any questions or concerns that you may have at that time regarding the information given to you following your procedure. This is a courtesy call and so if there is no answer at the home number and we have not heard from you through the emergency physician on call, we will assume that you have returned to your regular daily activities without incident.  SIGNATURES/CONFIDENTIALITY: You and/or your care partner have signed paperwork which will be entered into your electronic medical record.  These signatures attest to the fact that that the information above on your After Visit Summary has been reviewed and is understood.  Full responsibility of the confidentiality of this  discharge information lies with you and/or your care-partner.  Read all of the information given to you today.   Take your predisone only as ordered.  You are to go to the lab after your procedure today.

## 2014-06-01 ENCOUNTER — Telehealth: Payer: Self-pay

## 2014-06-01 NOTE — Telephone Encounter (Signed)
  Follow up Call-  Call back number 05/31/2014  Post procedure Call Back phone  # [phone]  Permission to leave phone message Yes     Patient questions:  Do you have a fever, pain , or abdominal swelling? No. Pain Score  0 *  Have you tolerated food without any problems? Yes.    Have you been able to return to your normal activities? Yes.    Do you have any questions about your discharge instructions: Diet   No. Medications  No. Follow up visit  No.  Do you have questions or concerns about your Care? No.  Actions: * If pain score is 4 or above: No action needed, pain <4.  No problems per the pt. maw

## 2014-06-06 ENCOUNTER — Telehealth: Payer: Self-pay | Admitting: Gastroenterology

## 2014-06-06 NOTE — Telephone Encounter (Signed)
yes

## 2014-06-06 NOTE — Telephone Encounter (Signed)
Dr [name] is Maalox ok for her to take.

## 2014-06-06 NOTE — Telephone Encounter (Signed)
Pt aware.

## 2014-06-08 LAB — THIOPURINE METHYLTRANSFERASE (TPMT), RBC: Thiopurine Methyltransferase, RBC: 16 (ref >12)

## 2014-06-15 ENCOUNTER — Encounter: Payer: Self-pay | Admitting: Gastroenterology

## 2014-06-15 ENCOUNTER — Ambulatory Visit (INDEPENDENT_AMBULATORY_CARE_PROVIDER_SITE_OTHER): Payer: Medicaid Other | Admitting: Gastroenterology

## 2014-06-15 VITALS — BP 120/82 | HR 72 | Ht 61.25 in | Wt 167.0 lb

## 2014-06-15 DIAGNOSIS — K529 Noninfective gastroenteritis and colitis, unspecified: Secondary | ICD-10-CM

## 2014-06-15 DIAGNOSIS — K6389 Other specified diseases of intestine: Secondary | ICD-10-CM

## 2014-06-15 MED ORDER — AZATHIOPRINE 50 MG PO TABS: 150.0000 mg | ORAL_TABLET | Freq: Every day | ORAL | Status: DC

## 2014-06-15 NOTE — Patient Instructions (Addendum)
Labs in 2 weeks (cbc, cmet). Start azathiaprine 150 mg once daily. Please return to see Dr. [name] on 08/16/14 945 am. In 2 weeks cut back prednisone by 47m per week until off. Call if you have terrible diarrhea while cutting back the prednisone. Check crohn's and colitis foundation of [employer] good IBD resource.

## 2014-06-15 NOTE — Progress Notes (Signed)
Review of pertinent gastrointestinal problems: 1. Crohn's ileitis: presented with diarrhea 2015, elevated sed rate; Colonoscopy 05/2014 [name] found completely normal colon but ulcerated terminal ileum; biopsies TI showed chronic inflammation, biopsies of right and left colon were normal.  Started on prednisone 10 twice daily with good response. TPMT enzyme activity 05/2014 was normal.  HPI: This is a  very pleasant 24 year old woman who is here with her fianc and her 34-monthold daughter    Started prednisone,  Noticed a HUGE improvement.  Diarrhea nearly gone.  Has had some right sided spasms.  Was having 4-6 loose, non bloody stools daily.  Now solid, formed BMs, about once per day.  Past Medical History  Diagnosis Date  . IBS (irritable bowel syndrome)   . H/O varicella 09/10/11  . Low iron 09/10/11  . Yeast infection 09/10/11  . Bacterial infection 09/10/11  . Trichomonas 09/10/10  . Hypotension   . Crohn's disease     Past Surgical History  Procedure Laterality Date  . Cesarean section  09/10/11    NRFHT - primary low transverse cesarean section   . Cesarean section N/A 09/04/2013    Procedure: CESAREAN SECTION- repeat;  Surgeon: UOsborne Oman MD;  [facility];  Service: Obstetrics;  Laterality: N/A;    Current Outpatient Prescriptions  Medication Sig Dispense Refill  . acetaminophen (TYLENOL) 500 MG tablet Take 1 tablet (500 mg total) by mouth every 6 (six) hours as needed. 30 tablet 0  . alum & mag hydroxide-simeth (MAALOX/MYLANTA) 200-200-20 MG/5ML suspension Take by mouth as needed for indigestion or heartburn.    . cetirizine (ZYRTEC) 10 MG tablet Take 10 mg by mouth at bedtime as needed for allergies.     . famotidine (PEPCID) 20 MG tablet Take 1 tablet (20 mg total) by mouth 2 (two) times daily. (Patient taking differently: Take 20 mg by mouth as needed. ) 30 tablet 1  . norethindrone-ethinyl estradiol (JUNEL FE,GILDESS FE,LOESTRIN FE) 1-20 MG-MCG tablet Take 1 tablet by  mouth daily.    . predniSONE (DELTASONE) 10 MG tablet Take 2 tablets (20 mg total) by mouth 2 (two) times daily. 100 tablet 1  . Prenatal Vit-Fe Fumarate-FA (PRENATAL COMPLETE) 14-0.4 MG TABS Take 1 tablet by mouth daily. 30 each 11   No current facility-administered medications for this visit.    Allergies as of 06/15/2014 - Review Complete 06/15/2014  Allergen Reaction Noted  . Lactose intolerance (gi) Diarrhea and Nausea And Vomiting 01/14/2013  . Flagyl [metronidazole] Itching and Rash 07/03/2013    Family History  Problem Relation Age of Onset  . Hypertension Mother   . Hypertension Father     History   Social History  . Marital Status: Single    Spouse Name: N/A    Number of Children: N/A  . Years of Education: N/A   Occupational History  . Not on file.   Social History Main Topics  . Smoking status: Former Smoker -- 0.10 packs/day for 8 years    Types: Cigarettes    Quit date: 06/09/2014  . Smokeless tobacco: Never Used  . Alcohol Use: No     Comment: occ  . Drug Use: Yes    Special: Marijuana     Comment: denies 02/03/14  . Sexual Activity: Yes    Birth Control/ Protection: None   Other Topics Concern  . Not on file   Social History Narrative      Physical Exam: BP 120/82 mmHg  Pulse 72  Ht 5' 1.25" (  1.556 m)  Wt 167 lb (75.751 kg)  BMI 31.29 kg/m2  LMP 05/02/2014  Breastfeeding? No Constitutional: generally well-appearing Psychiatric: alert and oriented x3 Abdomen: soft, nontender, nondistended, no obvious ascites, no peritoneal signs, normal bowel sounds     Assessment and plan: 24 y.o. female with Crohn's ileitis  Steroid responsive, she feels much better on her relatively low-dose prednisone. She had significant ulcerations in her terminal ileum.tpmt enzyme activity was normal and I'm going to start her on azathioprine dose to 2 mg/kg which would be 150 mg once daily. She will start tapering her prednisone by 5 mg per week, starting in  about 2 weeks from now. She will have a repeat set of labs in 2 weeks checking CBC and complete metabolic profile. She will return to see me in 2 months and sooner if needed.

## 2014-06-27 ENCOUNTER — Emergency Department (HOSPITAL_COMMUNITY): Payer: Medicaid Other

## 2014-06-27 ENCOUNTER — Encounter (HOSPITAL_COMMUNITY): Payer: Self-pay | Admitting: Emergency Medicine

## 2014-06-27 ENCOUNTER — Emergency Department (HOSPITAL_COMMUNITY)
Admission: EM | Admit: 2014-06-27 | Discharge: 2014-06-27 | Disposition: A | Discharge status: Home / Self Care | Payer: Medicaid Other | Attending: Emergency Medicine | Admitting: Emergency Medicine

## 2014-06-27 DIAGNOSIS — Z7952 Long term (current) use of systemic steroids: Secondary | ICD-10-CM | POA: Diagnosis not present

## 2014-06-27 DIAGNOSIS — Z720 Tobacco use: Secondary | ICD-10-CM | POA: Diagnosis not present

## 2014-06-27 DIAGNOSIS — Z8679 Personal history of other diseases of the circulatory system: Secondary | ICD-10-CM | POA: Insufficient documentation

## 2014-06-27 DIAGNOSIS — M533 Sacrococcygeal disorders, not elsewhere classified: Secondary | ICD-10-CM | POA: Diagnosis present

## 2014-06-27 DIAGNOSIS — M4306 Spondylolysis, lumbar region: Secondary | ICD-10-CM | POA: Diagnosis not present

## 2014-06-27 DIAGNOSIS — G8911 Acute pain due to trauma: Secondary | ICD-10-CM | POA: Insufficient documentation

## 2014-06-27 DIAGNOSIS — Z8719 Personal history of other diseases of the digestive system: Secondary | ICD-10-CM | POA: Diagnosis not present

## 2014-06-27 DIAGNOSIS — Z79899 Other long term (current) drug therapy: Secondary | ICD-10-CM | POA: Diagnosis not present

## 2014-06-27 DIAGNOSIS — D509 Iron deficiency anemia, unspecified: Secondary | ICD-10-CM | POA: Insufficient documentation

## 2014-06-27 DIAGNOSIS — Z793 Long term (current) use of hormonal contraceptives: Secondary | ICD-10-CM | POA: Diagnosis not present

## 2014-06-27 DIAGNOSIS — Z8619 Personal history of other infectious and parasitic diseases: Secondary | ICD-10-CM | POA: Insufficient documentation

## 2014-06-27 DIAGNOSIS — R52 Pain, unspecified: Secondary | ICD-10-CM

## 2014-06-27 MED ORDER — METHOCARBAMOL 500 MG PO TABS: 1000.0000 mg | ORAL_TABLET | Freq: Four times a day (QID) | ORAL | Status: DC | PRN

## 2014-06-27 NOTE — ED Provider Notes (Signed)
CSN: [id number]     Arrival date & time 06/27/14  1208 History  This chart was scribed for non-physician practitioner working with No att. providers found by Mercy Moore, ED Scribe. This patient was seen in room WTR5/WTR5 and the patient's care was started at 12:31 PM.   Chief Complaint  Patient presents with  . Tailbone Pain   The history is provided by the patient. No language interpreter was used.   HPI Comments: Sharon Clayton is a 24 y.o. female who presents to the Emergency Department complaining of coccyx pain sustained from a fall 02/03/2014. Patient shares that at the time of her fall she was unable to have imaging performed because she was pregnant. Patient reports that she has lost the baby and is here to be properly evaluated. Patient reports intermittent throbbing pain at her sacral region. Patient reports onset of pain with sitting and standing for prolonged periods. Patient reports alleviation with changing positions.Patient reports that her PCP has given her Hydrocodone, but she is unable to take it regularly because it causes Crohn's flares ups. Patient reports relief with Hydrocodone treatment; alternatively patient reports treatment with Motrin, heat and rest, but denies complete relief. Patient reports thather intermittent pain presents spontaneously on average 3-4 times a week.  Patient reports past radiation into her legs, but presently she reports pain localized at her sacrum. Patient denies loss of bladder or bowel control, numbness or tingling in her feet, dizziness, fever, chest pain, abdominal pain, or urinary symptoms.  LMP 06/17/2013.  Patient reports that she has been taking Prednisone BID for her Crohn's since 06/01/2014.   Past Medical History  Diagnosis Date  . IBS (irritable bowel syndrome)   . H/O varicella 09/10/11  . Low iron 09/10/11  . Yeast infection 09/10/11  . Bacterial infection 09/10/11  . Trichomonas 09/10/10  . Hypotension   . Crohn's disease    Past  Surgical History  Procedure Laterality Date  . Cesarean section  09/10/11    NRFHT - primary low transverse cesarean section   . Cesarean section N/A 09/04/2013    Procedure: CESAREAN SECTION- repeat;  Surgeon: Osborne Oman, MD;  Location: [facility];  Service: Obstetrics;  Laterality: N/A;   Family History  Problem Relation Age of Onset  . Hypertension Mother   . Hypertension Father    History  Substance Use Topics  . Smoking status: Current Every Day Smoker -- 0.10 packs/day for 8 years    Types: Cigarettes  . Smokeless tobacco: Never Used  . Alcohol Use: No     Comment: occ   OB History    Gravida Para Term Preterm AB TAB SAB Ectopic Multiple Living   3 2 2  0 1 0 1 0 0 2     Review of Systems  Constitutional: Negative for fever and chills.  Cardiovascular: Negative for chest pain.  Gastrointestinal: Negative for nausea, vomiting, abdominal pain and diarrhea.  Genitourinary: Negative for dysuria, frequency and hematuria.  Musculoskeletal: Positive for back pain.  Neurological: Negative for weakness and numbness.    Allergies  Lactose intolerance (gi) and Flagyl  Home Medications   Prior to Admission medications   Medication Sig Start Date End Date Taking? Authorizing Provider  acetaminophen (TYLENOL) 500 MG tablet Take 1 tablet (500 mg total) by mouth every 6 (six) hours as needed. 02/03/14   Martie Lee, PA-C  alum & mag hydroxide-simeth (MAALOX/MYLANTA) 200-200-20 MG/5ML suspension Take by mouth as needed for indigestion or heartburn.  Historical Provider, MD  azaTHIOprine (IMURAN) 50 MG tablet Take 3 tablets (150 mg total) by mouth daily. 06/15/14   Milus Banister, MD  cetirizine (ZYRTEC) 10 MG tablet Take 10 mg by mouth at bedtime as needed for allergies.     Historical Provider, MD  famotidine (PEPCID) 20 MG tablet Take 1 tablet (20 mg total) by mouth 2 (two) times daily. Patient taking differently: Take 20 mg by mouth as needed.  09/11/13 09/11/14  Manya Silvas,  CNM  methocarbamol (ROBAXIN) 500 MG tablet Take 2 tablets (1,000 mg total) by mouth 4 (four) times daily as needed (Pain). 06/27/14   Prezley Qadir, PA-C  norethindrone-ethinyl estradiol (JUNEL FE,GILDESS FE,LOESTRIN FE) 1-20 MG-MCG tablet Take 1 tablet by mouth daily.    Historical Provider, MD  predniSONE (DELTASONE) 10 MG tablet Take 2 tablets (20 mg total) by mouth 2 (two) times daily. 05/31/14   Milus Banister, MD  Prenatal Vit-Fe Fumarate-FA (PRENATAL COMPLETE) 14-0.4 MG TABS Take 1 tablet by mouth daily. 05/19/14   Luvenia Redden, PA-C   Triage Vitals: BP 137/67 mmHg  Pulse 95  Temp(Src) 98.7 F (37.1 C) (Oral)  Resp 19  SpO2 100%  LMP 06/17/2014 Physical Exam  Constitutional: She is oriented to person, place, and time. She appears well-developed and well-nourished. No distress.  HENT:  Head: Normocephalic and atraumatic.  Eyes: Conjunctivae and EOM are normal. Pupils are equal, round, and reactive to light. Right conjunctiva is not injected. Left conjunctiva is not injected. No scleral icterus.  Neck: Neck supple. No tracheal deviation present.  Cardiovascular: Normal rate and regular rhythm.  Exam reveals no gallop and no friction rub.   No murmur heard. Pulmonary/Chest: Effort normal and breath sounds normal. No respiratory distress.  Clear to ascultation bilaterally.   Musculoskeletal: Normal range of motion. She exhibits tenderness.  Diffusely tender to palpation over sacrum including SI joints. Can flex and extend both hips with active range of motion.  Patellar reflexes 2+ bilaterally. Patient ambulates with a slightly antalgic gait.  Neurovascular intact  Neurological: She is alert and oriented to person, place, and time.  Cranial nerves III-XII grossly intact. No gait abnormalities.   Skin: Skin is warm and dry.  Psychiatric: She has a normal mood and affect. Her behavior is normal.  Nursing note and vitals reviewed.   ED Course  Procedures (including critical  care time)  COORDINATION OF CARE: 12:45 PM- Discussed treatment plan with patient at bedside and patient agreed to plan.   Labs Review Labs Reviewed - No data to display  Imaging Review Dg Lumbar Spine Complete  06/27/2014   CLINICAL DATA:  Fall 4 months previous with lower back pain  EXAM: LUMBAR SPINE - COMPLETE 4+ VIEW  COMPARISON:  None.  FINDINGS: Mild disc space narrowing is noted at L4-5. No compression deformities are seen. L5 pars defects are noted bilaterally. Minimal anterolisthesis of L5 on S1 is noted. No other focal abnormality is seen.  IMPRESSION: Bilateral pars defects at L5.  Mild degenerative changes are noted.   Electronically Signed   By: Inez Catalina M.D.   On: 06/27/2014 13:43   Dg Sacrum/coccyx  06/27/2014   CLINICAL DATA:  Low back pain following injury 4 months previous, initial encounter  EXAM: SACRUM AND COCCYX - 2+ VIEW  COMPARISON:  None.  FINDINGS: Pelvic ring is intact. Bilateral pars defects are again noted at L5. No sacral or coccygeal abnormality is seen. No soft tissue changes are noted.  IMPRESSION: Pars  defects at L5.  No acute abnormality noted.   Electronically Signed   By: Inez Catalina M.D.   On: 06/27/2014 13:44     EKG Interpretation None      MDM   Final diagnoses:  Pain  Pars defect of lumbar spine    Filed Vitals:   06/27/14 1224  BP: 137/67  Pulse: 95  Temp: 98.7 F (37.1 C)  TempSrc: Oral  Resp: 19  SpO2: 100%    Elnore J Vanorman is a pleasant 24 y.o. female presenting with severe sacral pain which she has had on and off for approximately 6 months status post slip and fall. Neuro exam is nonfocal. Patient was not evaluated at the time. X-ray is ordered and patient is offered pain medication which she's declined. Throat with no acute fractures, there are bilateral pars defects file. I have advised patient to follow closely Dr. Odelia Gage orthopedist for further management. We'll write her a prescription for Robaxin.  Evaluation does  not show pathology that would require ongoing emergent intervention or inpatient treatment. Pt is hemodynamically stable and mentating appropriately. Discussed findings and plan with patient/guardian, who agrees with care plan. All questions answered. Return precautions discussed and outpatient follow up given.   Discharge Medication List as of 06/27/2014  1:57 PM    START taking these medications   Details  methocarbamol (ROBAXIN) 500 MG tablet Take 2 tablets (1,000 mg total) by mouth 4 (four) times daily as needed (Pain)., Starting 06/27/2014, Until Discontinued, Print           I personally performed the services described in this documentation, which was scribed in my presence. The recorded information has been reviewed and is accurate.    Monico Blitz, PA-C 06/27/14 1850

## 2014-06-27 NOTE — ED Notes (Signed)
Pt states that she had a fall accident in September and hurt her coccyx.  Pt states that she is still having pain ever since.  Pt states that at that time she couldn't have an xray bc she was pregnant but has lost the baby since. Pt here to find out what is causing the pain.

## 2014-06-27 NOTE — Discharge Instructions (Signed)
For pain control please take ibuprofen (also known as Motrin or Advil) 836m (this is normally 4 over the counter pills) 3 times a day  for 5 days. Take with food to minimize stomach irritation.  You can also take  tylenol (acetaminophen) 9755m(this is 3 over the counter pills) four times a day. Do not drink alcohol or combine with other medications that have acetaminophen as an ingredient (Read the labels!).    For breakthrough pain you may take Robaxin. Do not drink alcohol, drive or operate heavy machinery when taking Robaxin.  Please follow with your primary care doctor in the next 2 days for a check-up. They must obtain records for further management.   Do not hesitate to return to the Emergency Department for any new, worsening or concerning symptoms.

## 2014-06-29 ENCOUNTER — Telehealth: Payer: Self-pay

## 2014-06-29 ENCOUNTER — Other Ambulatory Visit (INDEPENDENT_AMBULATORY_CARE_PROVIDER_SITE_OTHER): Payer: Medicaid Other

## 2014-06-29 DIAGNOSIS — K6389 Other specified diseases of intestine: Secondary | ICD-10-CM

## 2014-06-29 LAB — CBC WITH DIFFERENTIAL/PLATELET
BASOS PCT: 0.6 % (ref 0.0–3.0)
Basophils Absolute: 0.1 10*3/uL (ref 0.0–0.1)
Eosinophils Absolute: 0 10*3/uL (ref 0.0–0.7)
Eosinophils Relative: 0.2 % (ref 0.0–5.0)
HCT: 36.8 % (ref 36.0–46.0)
Hemoglobin: 12.4 g/dL (ref 12.0–15.0)
Lymphocytes Relative: 20 % (ref 12.0–46.0)
Lymphs Abs: 2.6 10*3/uL (ref 0.7–4.0)
MCHC: 33.6 g/dL (ref 30.0–36.0)
MCV: 82.1 fl (ref 78.0–100.0)
MONO ABS: 0.7 10*3/uL (ref 0.1–1.0)
Monocytes Relative: 5.6 % (ref 3.0–12.0)
Neutro Abs: 9.6 10*3/uL — ABNORMAL HIGH (ref 1.4–7.7)
Neutrophils Relative %: 73.6 % (ref 43.0–77.0)
Platelets: 329 10*3/uL (ref 150.0–400.0)
RBC: 4.48 Mil/uL (ref 3.87–5.11)
RDW: 14.8 % (ref 11.5–15.5)
WBC: 13.1 10*3/uL — ABNORMAL HIGH (ref 4.0–10.5)

## 2014-06-29 LAB — COMPREHENSIVE METABOLIC PANEL
ALT: 15 U/L (ref 0–35)
AST: 12 U/L (ref 0–37)
Albumin: 3.5 g/dL (ref 3.5–5.2)
Alkaline Phosphatase: 80 U/L (ref 39–117)
BUN: 10 mg/dL (ref 6–23)
CALCIUM: 9.5 mg/dL (ref 8.4–10.5)
CHLORIDE: 104 meq/L (ref 96–112)
CO2: 29 mEq/L (ref 19–32)
Creatinine, Ser: 0.97 mg/dL (ref 0.40–1.20)
GFR: 89.73 mL/min (ref >60.00)
GLUCOSE: 72 mg/dL (ref 70–99)
POTASSIUM: 3.9 meq/L (ref 3.5–5.1)
Sodium: 138 mEq/L (ref 135–145)
TOTAL PROTEIN: 7.5 g/dL (ref 6.0–8.3)
Total Bilirubin: 0.3 mg/dL (ref 0.2–1.2)

## 2014-06-29 NOTE — Telephone Encounter (Signed)
Pt has been notified to have labs.

## 2014-06-29 NOTE — Telephone Encounter (Signed)
-----   Message from Barron Alvine, [place] sent at 06/15/2014  9:46 AM EST ----- Pt to get labs

## 2014-07-04 ENCOUNTER — Other Ambulatory Visit: Payer: Self-pay | Admitting: Gastroenterology

## 2014-07-04 DIAGNOSIS — K529 Noninfective gastroenteritis and colitis, unspecified: Secondary | ICD-10-CM

## 2014-07-08 ENCOUNTER — Telehealth: Payer: Self-pay | Admitting: Gastroenterology

## 2014-07-08 NOTE — Telephone Encounter (Signed)
Pt states she started taking prednisone 66m daily today and that this morning she started having diarrhea again. Pt wants to know if she needs to do anything different. Please advise.

## 2014-07-08 NOTE — Telephone Encounter (Signed)
Spoke to pt and she knows to start the Imuran today and to increase prednisone to 50m daily and to stay on that dose until follow-up visit. Pt verbalized understanding.

## 2014-07-08 NOTE — Telephone Encounter (Signed)
She needs to go back to taking pred 57m daily starting today.  No further tapering until I see her in office (should already have rov set)

## 2014-07-08 NOTE — Telephone Encounter (Signed)
Yes, i had asked her to start the imuran 3 weeks ago in the office.  SHe should start it now.  She should go back up to prednisone 104m a day and she should not taper at all until I see her at rov in early March.    She really needs to start the azathiaprine.

## 2014-07-08 NOTE — Telephone Encounter (Signed)
Called pt and she states she told us wrong she started 49m prednisone today. Do you want her to go back up to 164mdaily? Pt has not started the Imuran yet either, do you want her to be taking the Imuran 1506mow? Please advise.

## 2014-07-13 ENCOUNTER — Telehealth: Payer: Self-pay | Admitting: Gastroenterology

## 2014-07-13 NOTE — Telephone Encounter (Signed)
Pt started taking the azathioprine last week. States she is taking the 3 pills at night and when she wakes in the am her ankle joints are aching and her ankles feel really tight and heavy. States it takes a while on into the afternoon for this to go away. Pt wants to know if this is a reaction to the med. Also on prednisone 14m daily. Please advise.

## 2014-07-13 NOTE — Telephone Encounter (Signed)
Spoke with pt and she is aware and will come for labs today.

## 2014-07-13 NOTE — Telephone Encounter (Signed)
Would be an unusual reaction to the azathiaprine.  More likely a swelling reaction to steroids.  Would like labs today (cbc, cmet).  Stay on current meds for now.

## 2014-07-15 NOTE — ED Provider Notes (Signed)
Medical screening examination/treatment/procedure(s) were performed by non-physician practitioner and as supervising physician I was immediately available for consultation/collaboration.  Leota Jacobsen, MD 07/15/14 [phone]

## 2014-08-01 ENCOUNTER — Other Ambulatory Visit (INDEPENDENT_AMBULATORY_CARE_PROVIDER_SITE_OTHER): Payer: Medicaid Other

## 2014-08-01 ENCOUNTER — Ambulatory Visit (INDEPENDENT_AMBULATORY_CARE_PROVIDER_SITE_OTHER): Payer: Medicaid Other | Admitting: Physician Assistant

## 2014-08-01 ENCOUNTER — Encounter: Payer: Self-pay | Admitting: Physician Assistant

## 2014-08-01 ENCOUNTER — Telehealth: Payer: Self-pay | Admitting: Gastroenterology

## 2014-08-01 VITALS — BP 120/74 | HR 68 | Ht 61.0 in | Wt 180.0 lb

## 2014-08-01 DIAGNOSIS — R112 Nausea with vomiting, unspecified: Secondary | ICD-10-CM

## 2014-08-01 DIAGNOSIS — R1032 Left lower quadrant pain: Secondary | ICD-10-CM

## 2014-08-01 DIAGNOSIS — R109 Unspecified abdominal pain: Secondary | ICD-10-CM

## 2014-08-01 DIAGNOSIS — K5090 Crohn's disease, unspecified, without complications: Secondary | ICD-10-CM

## 2014-08-01 DIAGNOSIS — K6389 Other specified diseases of intestine: Secondary | ICD-10-CM

## 2014-08-01 LAB — CBC WITH DIFFERENTIAL/PLATELET
BASOS ABS: 0 10*3/uL (ref 0.0–0.1)
Basophils Absolute: 0 10*3/uL (ref 0.0–0.1)
Basophils Relative: 0.1 % (ref 0.0–3.0)
Basophils Relative: 0.3 % (ref 0.0–3.0)
EOS PCT: 1.3 % (ref 0.0–5.0)
Eosinophils Absolute: 0.1 10*3/uL (ref 0.0–0.7)
Eosinophils Absolute: 0 10*3/uL (ref 0.0–0.7)
Eosinophils Relative: 0.3 % (ref 0.0–5.0)
HCT: 35.7 % — ABNORMAL LOW (ref 36.0–46.0)
HCT: 35.8 % — ABNORMAL LOW (ref 36.0–46.0)
HEMOGLOBIN: 11.7 g/dL — AB (ref 12.0–15.0)
Hemoglobin: 11.8 g/dL — ABNORMAL LOW (ref 12.0–15.0)
LYMPHS ABS: 1 10*3/uL (ref 0.7–4.0)
LYMPHS PCT: 11.9 % — AB (ref 12.0–46.0)
LYMPHS PCT: 12 % (ref 12.0–46.0)
Lymphs Abs: 1 10*3/uL (ref 0.7–4.0)
MCHC: 33.2 g/dL (ref 30.0–36.0)
MCHC: 32.7 g/dL (ref 30.0–36.0)
MCV: 84.2 fl (ref 78.0–100.0)
MCV: 84.5 fl (ref 78.0–100.0)
MONOS PCT: 1.8 % — AB (ref 3.0–12.0)
MONOS PCT: 2.2 % — AB (ref 3.0–12.0)
Monocytes Absolute: 0.1 10*3/uL (ref 0.1–1.0)
Monocytes Absolute: 0.2 10*3/uL (ref 0.1–1.0)
Neutro Abs: 7 10*3/uL (ref 1.4–7.7)
Neutro Abs: 7.2 10*3/uL (ref 1.4–7.7)
Neutrophils Relative %: 84.9 % — ABNORMAL HIGH (ref 43.0–77.0)
Platelets: 333 10*3/uL (ref 150.0–400.0)
Platelets: 319 10*3/uL (ref 150.0–400.0)
RBC: 4.24 Mil/uL (ref 3.87–5.11)
RBC: 4.23 Mil/uL (ref 3.87–5.11)
RDW: 17.2 % — AB (ref 11.5–15.5)
RDW: 17.2 % — ABNORMAL HIGH (ref 11.5–15.5)
WBC: 8.3 10*3/uL (ref 4.0–10.5)
WBC: 8.4 10*3/uL (ref 4.0–10.5)

## 2014-08-01 MED ORDER — PREDNISONE 10 MG PO TABS: ORAL_TABLET | ORAL | Status: DC

## 2014-08-01 NOTE — Progress Notes (Signed)
i agree with the note, plan above

## 2014-08-01 NOTE — Patient Instructions (Addendum)
You have been scheduled for a CT scan of the abdomen and pelvis at [facility] are scheduled on  08/04/2014 at 8:30am. You should arrive 15 minutes prior to your appointment time for registration. Please follow the written instructions below on the day of your exam:  WARNING: IF YOU ARE ALLERGIC TO IODINE/X-RAY DYE, PLEASE NOTIFY RADIOLOGY IMMEDIATELY AT [phone]! YOU WILL BE GIVEN A 13 HOUR PREMEDICATION PREP.  1) Do not eat or drink anything after 4:30am (4 hours prior to your test) 2) You have been given 2 bottles of oral contrast to drink. The solution may taste               better if refrigerated, but do NOT add ice or any other liquid to this solution. Shake             well before drinking.    Drink 1 bottle of contrast @ 6:30am (2 hours prior to your exam)  Drink 1 bottle of contrast @ 7:30am (1 hour prior to your exam)  You may take any medications as prescribed with a small amount of water except for the following: Metformin, Glucophage, Glucovance, Avandamet, Riomet, Fortamet, Actoplus Met, Janumet, Glumetza or Metaglip. The above medications must be held the day of the exam AND 48 hours after the exam.  The purpose of you drinking the oral contrast is to aid in the visualization of your intestinal tract. The contrast solution may cause some diarrhea. Before your exam is started, you will be given a small amount of fluid to drink. Depending on your individual set of symptoms, you may also receive an intravenous injection of x-ray contrast/dye. Plan on being at [facility] for 30 minutes or long, depending on the type of exam you are having performed.  This test typically takes 30-45 minutes to complete.  If you have any questions regarding your exam or if you need to reschedule, you may call the CT department at [phone] between the hours of 8:00 am and 5:00 pm, Monday-Friday.  ________________________________________________________________________   Go  to the basement for labs today We are sending in your prednisone prescription to your pharmacy today  Prednisone taper: Take 40 mg every day for 7 days Take 30 mg every day for 7 days Then take 25 mg every day for 7 days Then take 20 mg every day for 7 days Then take 15 mg every day for 7 days Then take 10 mg every day for 7 days  Then take 5 mg every day for 7 days then disconinue   Low-Fiber Diet Fiber is found in fruits, vegetables, and whole grains. A low-fiber diet restricts fibrous foods that are not digested in the small intestine. A diet containing about 10-15 grams of fiber per day is considered low fiber. Low-fiber diets may be used to:  Promote healing and rest the bowel during intestinal flare-ups.  Prevent blockage of a partially obstructed or narrowed gastrointestinal tract.  Reduce fecal weight and volume.  Slow the movement of feces. You may be on a low-fiber diet as a transitional diet following surgery, after an injury (trauma), or because of a short (acute) or lifelong (chronic) illness. Your health care provider will determine the length of time you need to stay on this diet.  WHAT DO I NEED TO KNOW ABOUT A LOW-FIBER DIET? Always check the fiber content on the packaging's Nutrition Facts label, especially on foods from the grains list. Ask your dietitian if you have questions about  specific foods that are related to your condition, especially if the food is not listed below. In general, a low-fiber food will have less than 2 g of fiber. WHAT FOODS CAN I EAT? Grains All breads and crackers made with white flour. Sweet rolls, doughnuts, waffles, pancakes, [place] toast, bagels. Pretzels, Melba toast, zwieback. Well-cooked cereals, such as cornmeal, farina, or cream cereals. Dry cereals that do not contain whole grains, fruit, or nuts, such as refined corn, wheat, rice, and oat cereals. Potatoes prepared any way without skins, plain pastas and noodles, refined white rice.  Use white flour for baking and making sauces. Use allowed list of grains for casseroles, dumplings, and puddings.  Vegetables Strained tomato and vegetable juices. Fresh lettuce, cucumber, spinach. Well-cooked (no skin or pulp) or canned vegetables, such as asparagus, bean sprouts, beets, carrots, green beans, mushrooms, potatoes, pumpkin, spinach, yellow squash, tomato sauce/puree, turnips, yams, and zucchini. Keep servings limited to  cup.  Fruits All fruit juices except prune juice. Cooked or canned fruits without skin and seeds, such as applesauce, apricots, cherries, fruit cocktail, grapefruit, grapes, mandarin oranges, melons, peaches, pears, pineapple, and plums. Fresh fruits without skin, such as apricots, avocados, bananas, melons, pineapple, nectarines, and peaches. Keep servings limited to  cup or 1 piece.  Meat and Other Protein Sources Ground or well-cooked tender beef, ham, veal, lamb, pork, or poultry. Eggs, plain cheese. Fish, oysters, shrimp, lobster, and other seafood. Liver, organ meats. Smooth nut butters. Dairy All milk products and alternative dairy substitutes, such as soy, rice, almond, and coconut, not containing added whole nuts, seeds, or added fruit. Beverages Decaf coffee, fruit, and vegetable juices or smoothies (small amounts, with no pulp or skins, and with fruits from allowed list), sports drinks, herbal tea. Condiments Ketchup, mustard, vinegar, cream sauce, cheese sauce, cocoa powder. Spices in moderation, such as allspice, basil, bay leaves, celery powder or leaves, cinnamon, cumin powder, curry powder, ginger, mace, marjoram, onion or garlic powder, oregano, paprika, parsley flakes, ground pepper, rosemary, sage, savory, tarragon, thyme, and turmeric. Sweets and Desserts Plain cakes and cookies, pie made with allowed fruit, pudding, custard, cream pie. Gelatin, fruit, ice, sherbet, frozen ice pops. Ice cream, ice milk without nuts. Plain hard candy, honey, jelly,  molasses, syrup, sugar, chocolate syrup, gumdrops, marshmallows. Limit overall sugar intake.  Fats and Oil Margarine, butter, cream, mayonnaise, salad oils, plain salad dressings made from allowed foods. Choose healthy fats such as olive oil, canola oil, and omega-3 fatty acids (such as found in salmon or tuna) when possible.  Other Bouillon, broth, or cream soups made from allowed foods. Any strained soup. Casseroles or mixed dishes made with allowed foods. The items listed above may not be a complete list of recommended foods or beverages. Contact your dietitian for more options.  WHAT FOODS ARE NOT RECOMMENDED? Grains All whole wheat and whole grain breads and crackers. Multigrains, rye, bran seeds, nuts, or coconut. Cereals containing whole grains, multigrains, bran, coconut, nuts, raisins. Cooked or dry oatmeal, steel-cut oats. Coarse wheat cereals, granola. Cereals advertised as high fiber. Potato skins. Whole grain pasta, wild or brown rice. Popcorn. Coconut flour. Bran, buckwheat, corn bread, multigrains, rye, wheat germ.  Vegetables Fresh, cooked or canned vegetables, such as artichokes, asparagus, beet greens, broccoli, Brussels sprouts, cabbage, celery, cauliflower, corn, eggplant, kale, legumes or beans, okra, peas, and tomatoes. Avoid large servings of any vegetables, especially raw vegetables.  Fruits Fresh fruits, such as apples with or without skin, berries, cherries, figs, grapes, grapefruit, guavas, kiwis,  mangoes, oranges, papayas, pears, persimmons, pineapple, and pomegranate. Prune juice and juices with pulp, stewed or dried prunes. Dried fruits, dates, raisins. Fruit seeds or skins. Avoid large servings of all fresh fruits. Meats and Other Protein Sources Tough, fibrous meats with gristle. Chunky nut butter. Cheese made with seeds, nuts, or other foods not recommended. Nuts, seeds, legumes (beans, including baked beans), dried peas, beans, lentils.  Dairy Yogurt or cheese that  contains nuts, seeds, or added fruit.  Beverages Fruit juices with high pulp, prune juice. Caffeinated coffee and teas.  Condiments Coconut, maple syrup, pickles, olives. Sweets and Desserts Desserts, cookies, or candies that contain nuts or coconut, chunky peanut butter, dried fruits. Jams, preserves with seeds, marmalade. Large amounts of sugar and sweets. Any other dessert made with fruits from the not recommended list.  Other Soups made from vegetables that are not recommended or that contain other foods not recommended.  The items listed above may not be a complete list of foods and beverages to avoid. Contact your dietitian for more information. Document Released: 11/16/2001 Document Revised: 06/01/2013 Document Reviewed: 04/19/2013 [facility] Patient Information 2015 [place], [place]. This information is not intended to replace advice given to you by your health care provider. Make sure you discuss any questions you have with your health care provider. '  Your follow up appointment with Cecille Rubin is scheduled on 08/22/2014 at 8:15am

## 2014-08-01 NOTE — Telephone Encounter (Signed)
Pt on 5 mg of prednisone and azathioprine,  She has begun to have diarrhea and bloating.  Not able to eat.  She has a f/u in March with Dr [name].  She requested sooner appt. She was sched with Cecille Rubin for today at 215 pm

## 2014-08-01 NOTE — Progress Notes (Signed)
Patient ID: Sharon Clayton, female   DOB: 01/30/1991, 24 y.o.   MRN: [id number]     History of Present Illness:   Blanch Media is a pleasant 24 year old African-American female who presented in 2015 with diarrhea and an elevated sedimentation rate. She had a colonoscopy in December 2015 by Dr. [name] and the colon was normal but she had an ulcerated terminal ileum. Biopsies of the terminal ileum showed chronic inflammation, biopsies of the right and left colon were normal she was started on a prednisone taper and when last seen in the office done January 6 was started on azathioprine 150 mg daily she felt much better on the prednisone but once she got below 10 mg daily her symptoms of diarrhea and abdominal pain reoccurred. She is here today with complaints of left upper quadrant and left lower quadrant abdominal pain along with mushy urgent bowel movements that occur 4 times per day. She is having nocturnal stooling and urgency. She has had no bright red blood per rectum or melena. She does have some nausea but has not had any vomiting. She has not had any recent antibiotics. He has no fever or chills and has not had night sweats. Her appetite has been poor but her weight has been stable. She has no associated I pain, joint pain, oral ulcers, or skin rashes.   Past Medical History  Diagnosis Date  . IBS (irritable bowel syndrome)   . H/O varicella 09/10/11  . Low iron 09/10/11  . Yeast infection 09/10/11  . Bacterial infection 09/10/11  . Trichomonas 09/10/10  . Hypotension   . Crohn's disease     Past Surgical History  Procedure Laterality Date  . Cesarean section  09/10/11    NRFHT - primary low transverse cesarean section   . Cesarean section N/A 09/04/2013    Procedure: CESAREAN SECTION- repeat;  Surgeon: Osborne Oman, MD;  Location: [facility];  Service: Obstetrics;  Laterality: N/A;   Family History  Problem Relation Age of Onset  . Hypertension Mother   . Hypertension Father    History  Substance  Use Topics  . Smoking status: Current Every Day Smoker -- 0.10 packs/day for 8 years    Types: Cigarettes  . Smokeless tobacco: Never Used  . Alcohol Use: No     Comment: occ   Current Outpatient Prescriptions  Medication Sig Dispense Refill  . acetaminophen (TYLENOL) 500 MG tablet Take 1 tablet (500 mg total) by mouth every 6 (six) hours as needed. 30 tablet 0  . alum & mag hydroxide-simeth (MAALOX/MYLANTA) 200-200-20 MG/5ML suspension Take by mouth as needed for indigestion or heartburn.    . azaTHIOprine (IMURAN) 50 MG tablet Take 3 tablets (150 mg total) by mouth daily. 90 tablet 5  . cetirizine (ZYRTEC) 10 MG tablet Take 10 mg by mouth at bedtime as needed for allergies.     . famotidine (PEPCID) 20 MG tablet Take 1 tablet (20 mg total) by mouth 2 (two) times daily. (Patient taking differently: Take 20 mg by mouth as needed. ) 30 tablet 1  . methocarbamol (ROBAXIN) 500 MG tablet Take 2 tablets (1,000 mg total) by mouth 4 (four) times daily as needed (Pain). 20 tablet 0  . norethindrone-ethinyl estradiol (JUNEL FE,GILDESS FE,LOESTRIN FE) 1-20 MG-MCG tablet Take 1 tablet by mouth daily.    . predniSONE (DELTASONE) 10 MG tablet Take 2 tablets (20 mg total) by mouth 2 (two) times daily. 100 tablet 1  . Prenatal Vit-Fe Fumarate-FA (PRENATAL COMPLETE) 14-0.4  MG TABS Take 1 tablet by mouth daily. 30 each 11  . predniSONE (DELTASONE) 10 MG tablet As directed for patient she has instructions 120 tablet 0   No current facility-administered medications for this visit.   Allergies  Allergen Reactions  . Lactose Intolerance (Gi) Diarrhea and Nausea And Vomiting  . Flagyl [Metronidazole] Itching and Rash    Pt has used Metrogel without reaction, but doesn't tolerate tablets      Review of Systems: Gen: Denies any fever, chills, sweats, anorexia, fatigue, weakness, malaise, weight loss, and sleep disorder CV: Denies chest pain, angina, palpitations, syncope, orthopnea, PND, peripheral edema,  and claudication. Resp: Denies dyspnea at rest, dyspnea with exercise, cough, sputum, wheezing, coughing up blood, and pleurisy. GI: Denies vomiting blood, jaundice, and fecal incontinence.   Denies dysphagia or odynophagia. GU : Denies urinary burning, blood in urine, urinary frequency, urinary hesitancy, nocturnal urination, and urinary incontinence. MS: Denies joint pain, limitation of movement, and swelling, stiffness, low back pain, extremity pain. Denies muscle weakness, cramps, atrophy.  Derm: Denies rash, itching, dry skin, hives, moles, warts, or unhealing ulcers.  Psych: Denies depression, anxiety, memory loss, suicidal ideation, hallucinations, paranoia, and confusion. Heme: Denies bruising, bleeding, and enlarged lymph nodes. Neuro:  Denies any headaches, dizziness, paresthesia Endo:  Denies any problems with DM, thyroid, adrenal  PROCEDURES: ENDOSCOPIC IMPRESSION: There was moderate to severe inflammation in the terminal ileum (to the extent of the exam which was at least the distal most 10-15cm of the TI). There were multiple, linear ulcers in this region. Multiple biopsies were taken and sent to pathology. The colon was completely normal appearing however given the small bowel findings I biospied the right and left colon to check for mild inflammation. 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 RECOMMENDATIONS: Will start on prednisone today. Take 2 pills twice daily (called into your pharmacy. Also bloodwork today (TPMT enzyme activity). Return office visit in 2-3 weeks (office to double book if needed). eSigned: Milus Banister, MD 05/31/2014 3:21 PM    Physical Exam: General: Pleasant, well developed female in no acute distress Head: Normocephalic and atraumatic Eyes:  sclerae anicteric, conjunctiva pink  Ears: Normal auditory acuity Lungs: Clear throughout to auscultation Heart: Regular rate and rhythm Abdomen: Soft, non distended, tender left side, no rebound, some guarding, no  CVAT, No masses, no hepatomegaly. Normal bowel sounds Musculoskeletal: Symmetrical with no gross deformities  Extremities: No edema  Neurological: Alert oriented x 4, grossly nonfocal Psychological:  Alert and cooperative. Normal mood and affect  Assessment and Recommendations:  24 year old female recently diagnosed with Crohn's in December 2015 who experienced significant improvement on initial steroid taper. As she got below 10 mg her symptoms recurred and she is now having left-sided abdominal pain, nausea, and diarrhea, a CBC and comprehensive metabolic panel will be obtained along with the GI stool pathogen panel. She will be scheduled for an abdominal and pelvic CT to assess disease activity she will be placed back on prednisone 40 mg daily for one week, then 30 mg daily for one week, then 25 mg per 1 week, then decrease by 5 mg per week until complete. She will follow up in 3 weeks, sooner if needed. Patient is unable to tolerate Flagyl due to a rash and itching that it is caused in the past.    Kristyna Bradstreet, Deloris Ping 08/01/2014, Pager [phone]

## 2014-08-02 LAB — COMPREHENSIVE METABOLIC PANEL
ALBUMIN: 3.5 g/dL (ref 3.5–5.2)
ALT: 26 U/L (ref 0–35)
AST: 25 U/L (ref 0–37)
Alkaline Phosphatase: 78 U/L (ref 39–117)
BUN: 10 mg/dL (ref 6–23)
CHLORIDE: 107 meq/L (ref 96–112)
CO2: 25 meq/L (ref 19–32)
Calcium: 9.2 mg/dL (ref 8.4–10.5)
Creatinine, Ser: 0.84 mg/dL (ref 0.40–1.20)
GFR: 105.86 mL/min (ref >60.00)
Glucose, Bld: 94 mg/dL (ref 70–99)
Potassium: 4.4 mEq/L (ref 3.5–5.1)
Sodium: 138 mEq/L (ref 135–145)
Total Bilirubin: 0.4 mg/dL (ref 0.2–1.2)
Total Protein: 7.3 g/dL (ref 6.0–8.3)

## 2014-08-04 ENCOUNTER — Encounter (HOSPITAL_COMMUNITY): Payer: Self-pay

## 2014-08-04 ENCOUNTER — Ambulatory Visit (HOSPITAL_COMMUNITY)
Admission: RE | Admit: 2014-08-04 | Discharge: 2014-08-04 | Disposition: A | Discharge status: Home / Self Care | Payer: Medicaid Other | Source: Ambulatory Visit | Attending: Physician Assistant | Admitting: Physician Assistant

## 2014-08-04 DIAGNOSIS — R109 Unspecified abdominal pain: Secondary | ICD-10-CM

## 2014-08-04 DIAGNOSIS — K5090 Crohn's disease, unspecified, without complications: Secondary | ICD-10-CM | POA: Diagnosis not present

## 2014-08-04 DIAGNOSIS — R112 Nausea with vomiting, unspecified: Secondary | ICD-10-CM

## 2014-08-04 DIAGNOSIS — R1032 Left lower quadrant pain: Secondary | ICD-10-CM | POA: Insufficient documentation

## 2014-08-04 DIAGNOSIS — K561 Intussusception: Secondary | ICD-10-CM | POA: Insufficient documentation

## 2014-08-04 MED ORDER — IOHEXOL 300 MG/ML  SOLN
100.0000 mL | Freq: Once | INTRAMUSCULAR | Status: AC | PRN
Start: 2014-08-04 — End: 2014-08-04
  Administered 2014-08-04: 100 mL via INTRAVENOUS

## 2014-08-05 ENCOUNTER — Telehealth: Payer: Self-pay | Admitting: *Deleted

## 2014-08-05 DIAGNOSIS — K50918 Crohn's disease, unspecified, with other complication: Secondary | ICD-10-CM

## 2014-08-05 NOTE — Telephone Encounter (Signed)
-----   Message from Vita Barley Hvozdovic, PA-C sent at 08/05/2014  8:08 AM EST ----- Rollene Fare, can you let pt know CT scan witl continued ileal inflammation. Cont meds as she was instructed at OV earlier this week.Can you have her get a TB quantiferon gold, Hep B surface antigen and Hep B surface antibody drawn? Have her keep her f/u with Dr Orie Rout on 3/7 or 3/8 ( it was sometime around then) and cancel her appt with me. Thank you. ----- Message -----    From: Milus Banister, MD    Sent: 08/05/2014   7:37 AM      To: Vita Barley Hvozdovic, PA-C  The intussusception is probably incidental and not of concern. She's only been on azathiaprine for 5-6 weeks now, would like to see if she can taper off prednisone one more time (by 52m per week).  If she cannot get below 157magain, then we need to advance her to biologics. Lets get quant gold, Hep B surface antigen, Hep B surface antibody now as well.    Not sure when her next follow up is, would like it to be with me if possible (3-4 weeks).  Thanks  ----- Message -----    From: LoVita Barleyvozdovic, PA-C    Sent: 08/04/2014   3:55 PM      To: DaMilus BanisterMD  Dr JaArdis HughsLaVirginia Crewsas in 2 days ago with recurrent diarrhea once she got below 10 mg prednisone. CT with focal intussusception prox jejunum. Contrast flows thru, and there does not appear to be any wall edema.Distal third of ileum still thickened. She is on 40 mg pred and 150 imuran or mtx . What would you suggest as next step? LoCecille Rubin

## 2014-08-05 NOTE — Telephone Encounter (Signed)
Labs in EPIC. Patient given results and recommendations. Cancelled appointment with Cecille Rubin Hvozdovic, PA-C. Patient aware.

## 2014-08-09 LAB — OTHER SOLSTAS TEST: Miscellaneous Test: 65008

## 2014-08-16 ENCOUNTER — Ambulatory Visit: Payer: Self-pay | Admitting: Gastroenterology

## 2014-08-22 ENCOUNTER — Ambulatory Visit: Payer: Self-pay | Admitting: Physician Assistant

## 2014-08-29 ENCOUNTER — Telehealth: Payer: Self-pay | Admitting: Gastroenterology

## 2014-08-29 NOTE — Telephone Encounter (Signed)
Left message for pt to call back  °

## 2014-08-29 NOTE — Telephone Encounter (Signed)
Spoke with pt that it should be fine but to verify with her pharmacist before taking it. Pt verbalized understanding.

## 2014-09-24 IMAGING — US US OB TRANSVAGINAL
1 series · 14 of 28 positions shown · non-contrast
Comparison: None.

CLINICAL DATA: Patient with left lower quadrant pain.  Quantitative
HCG [DATE].

OBSTETRIC <14 WK US AND TRANSVAGINAL OB US
TECHNIQUE: Both transabdominal and transvaginal ultrasound
examinations were performed for complete evaluation of the
gestation as well as the maternal uterus, adnexal regions, and
pelvic cul-de-sac.  Transvaginal technique was performed to assess
early pregnancy.

[Series 1: us ob transvaginal · 0.22mm/px · 14 of 125 slices shown]
[im 5/125]
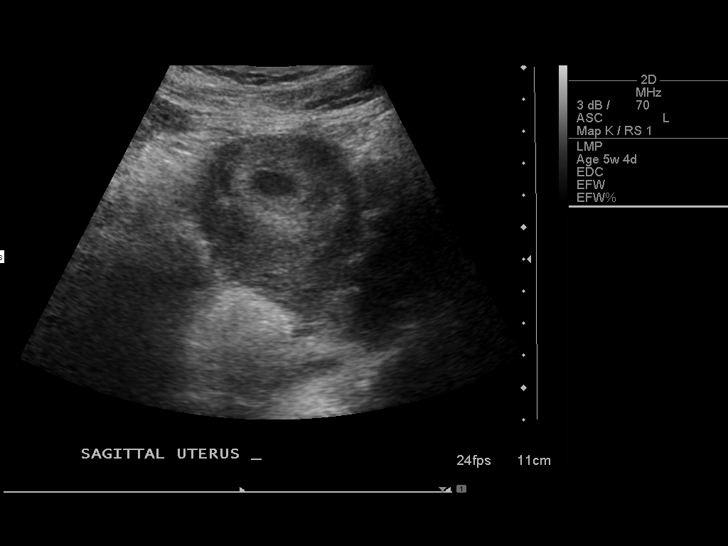
[im 14/125]
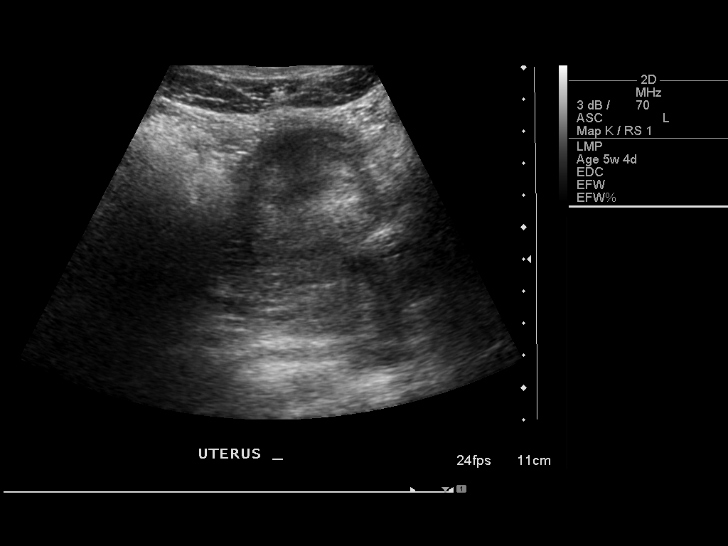
[im 23/125]
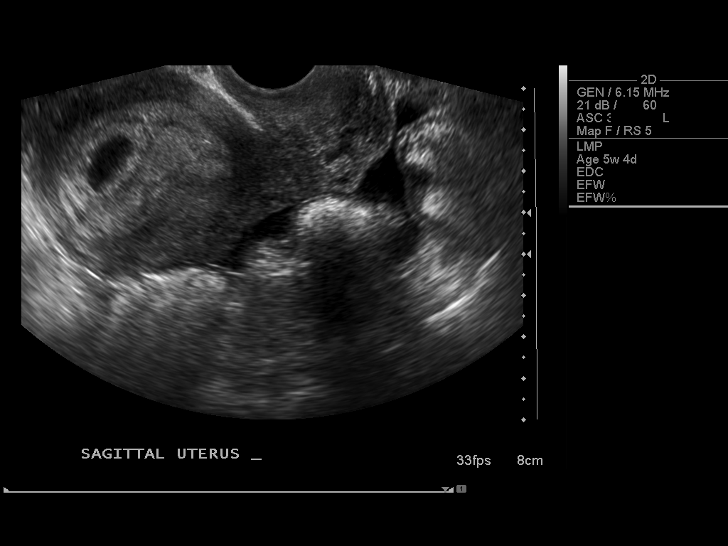
[im 33/125]
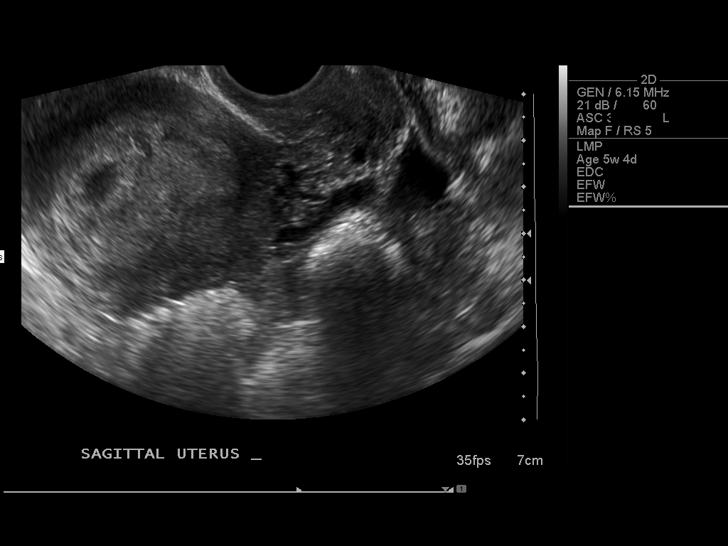
[im 42/125]
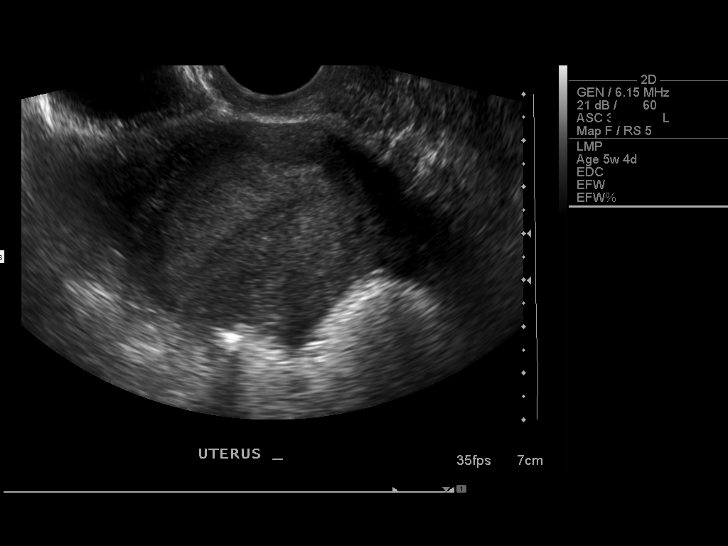
[im 51/125]
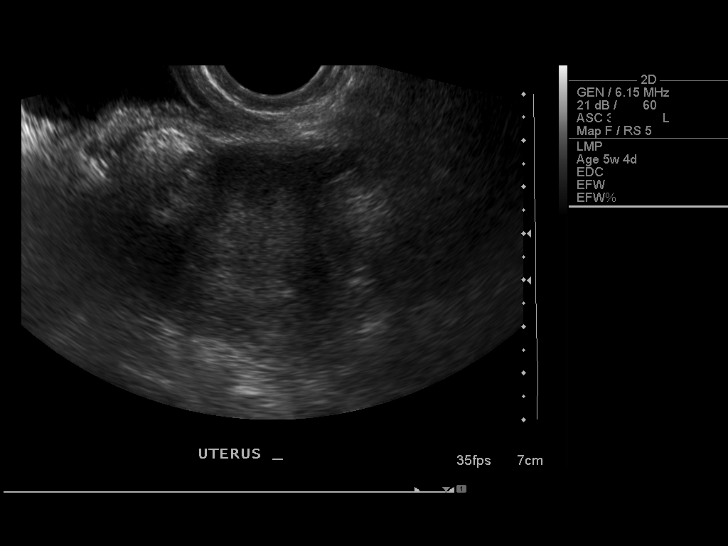
[im 60/125]
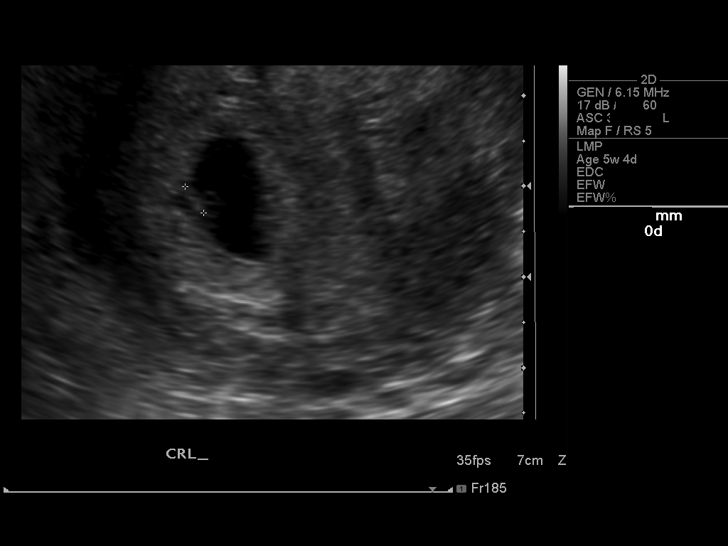
[im 69/125]
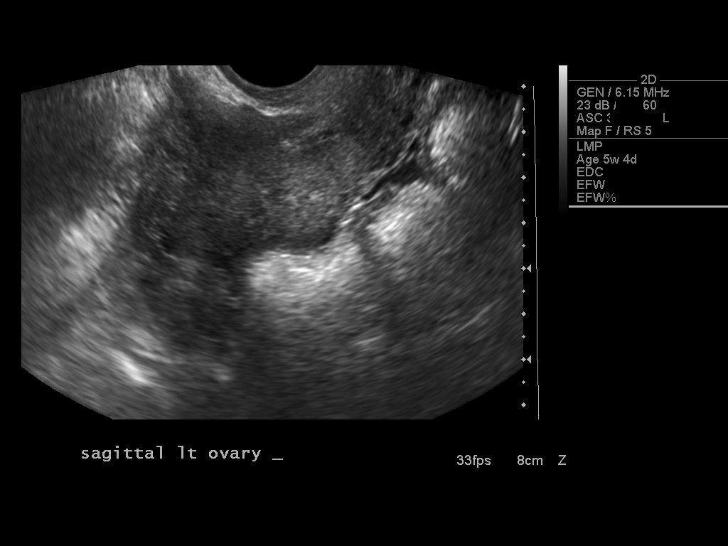
[im 79/125]
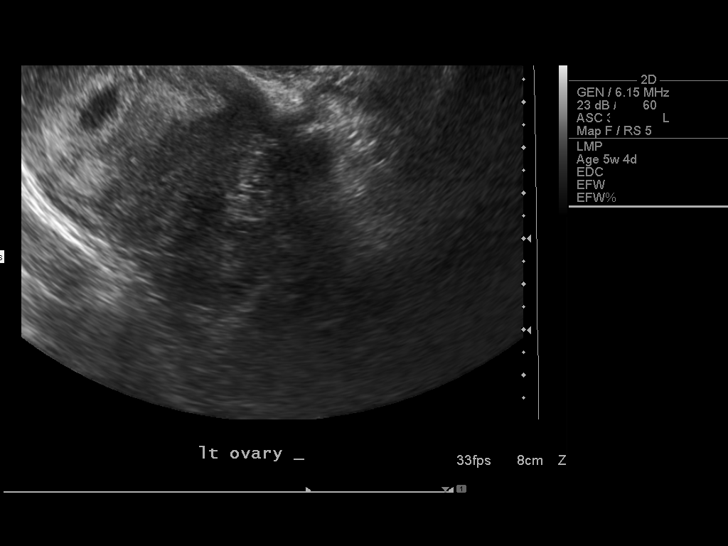
[im 88/125]
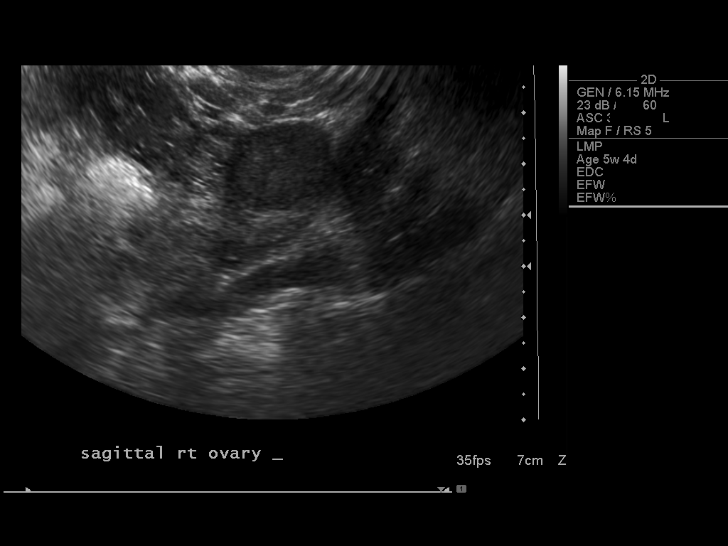
[im 97/125]
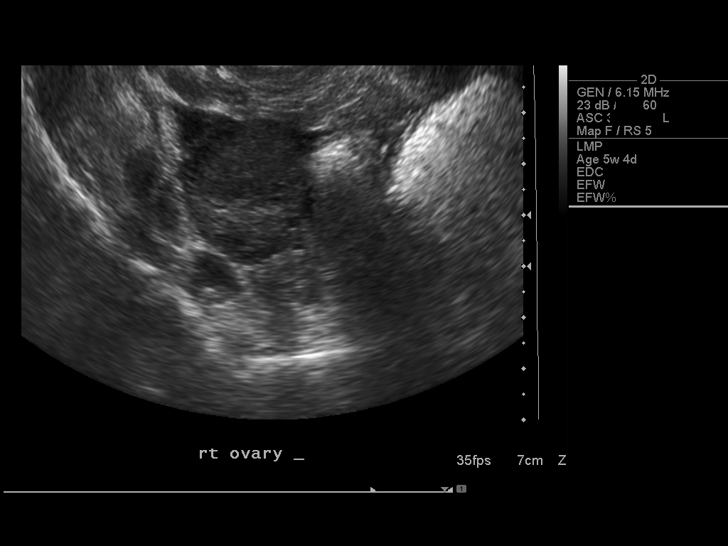
[im 106/125]
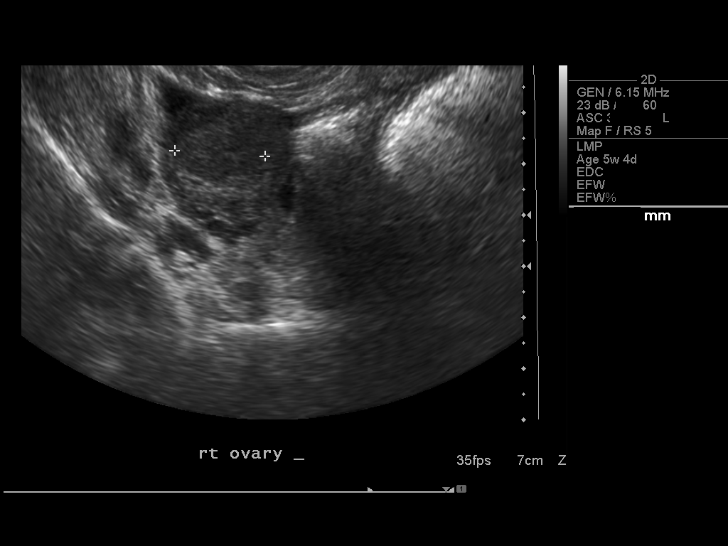
[im 115/125]
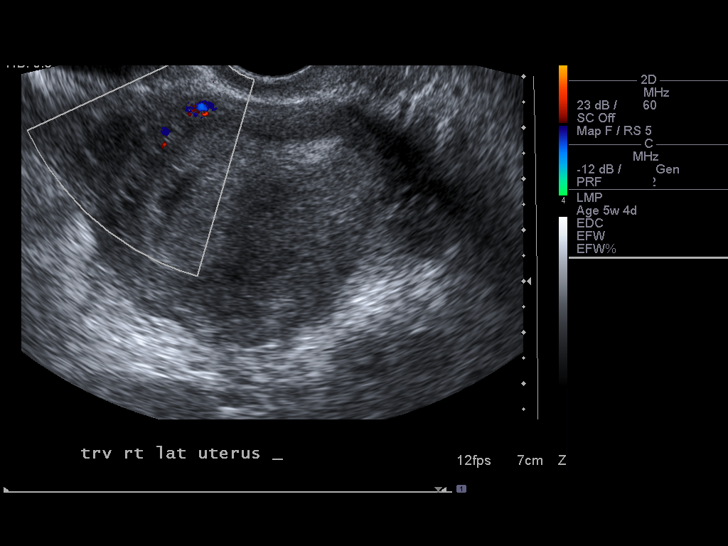
[im 125/125]
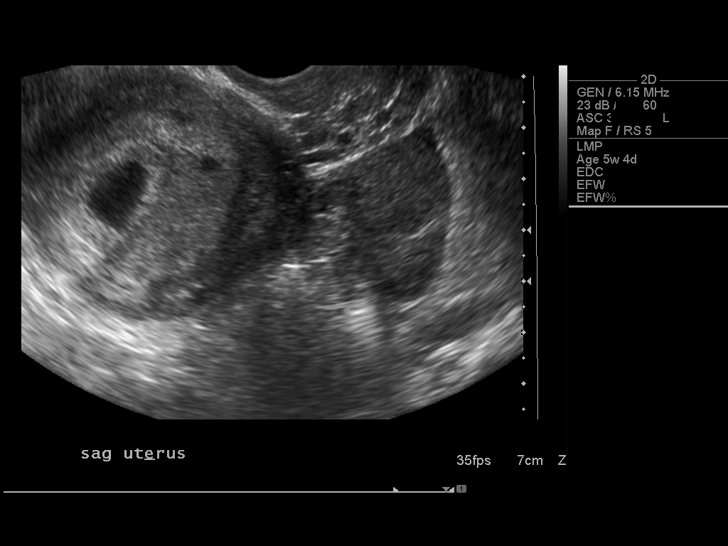

[14 of 28 positions shown; findings below may reference images not displayed]

Intrauterine gestational sac:  Visualized/normal in shape.
Yolk sac: Visualized.
Embryo: Visualized.
Cardiac Activity: Detected. Heart rate could not be determined via
M mode due to early gestational age and uterine orientation.  Cine
imaging confirms cardiac activity.

CRL: 3.9  mm  6 w  0 d        US EDC: 09/10/2013

Maternal uterus/adnexae:
Corpus luteum cyst on the right is noted.  Small subchorionic
hemorrhage is identified measuring 1.6 x 0.5 x 0.4 cm.
IMPRESSION: Single living intrauterine pregnancy with a small subchorionic
hemorrhage.

## 2014-10-12 ENCOUNTER — Emergency Department (HOSPITAL_COMMUNITY)
Admission: EM | Admit: 2014-10-12 | Discharge: 2014-10-12 | Disposition: A | Discharge status: Home / Self Care | Payer: Medicaid Other | Attending: Emergency Medicine | Admitting: Emergency Medicine

## 2014-10-12 ENCOUNTER — Encounter (HOSPITAL_COMMUNITY): Payer: Self-pay

## 2014-10-12 DIAGNOSIS — R6883 Chills (without fever): Secondary | ICD-10-CM | POA: Insufficient documentation

## 2014-10-12 DIAGNOSIS — Z8679 Personal history of other diseases of the circulatory system: Secondary | ICD-10-CM | POA: Diagnosis not present

## 2014-10-12 DIAGNOSIS — M542 Cervicalgia: Secondary | ICD-10-CM | POA: Diagnosis not present

## 2014-10-12 DIAGNOSIS — Z79899 Other long term (current) drug therapy: Secondary | ICD-10-CM | POA: Diagnosis not present

## 2014-10-12 DIAGNOSIS — R51 Headache: Secondary | ICD-10-CM | POA: Insufficient documentation

## 2014-10-12 DIAGNOSIS — Z7951 Long term (current) use of inhaled steroids: Secondary | ICD-10-CM | POA: Diagnosis not present

## 2014-10-12 DIAGNOSIS — Z8619 Personal history of other infectious and parasitic diseases: Secondary | ICD-10-CM | POA: Diagnosis not present

## 2014-10-12 DIAGNOSIS — K011 Impacted teeth: Secondary | ICD-10-CM | POA: Diagnosis not present

## 2014-10-12 DIAGNOSIS — Z7952 Long term (current) use of systemic steroids: Secondary | ICD-10-CM | POA: Insufficient documentation

## 2014-10-12 DIAGNOSIS — R0981 Nasal congestion: Secondary | ICD-10-CM | POA: Diagnosis not present

## 2014-10-12 DIAGNOSIS — Z720 Tobacco use: Secondary | ICD-10-CM | POA: Insufficient documentation

## 2014-10-12 DIAGNOSIS — R599 Enlarged lymph nodes, unspecified: Secondary | ICD-10-CM | POA: Insufficient documentation

## 2014-10-12 LAB — RAPID STREP SCREEN (MED CTR MEBANE ONLY): Streptococcus, Group A Screen (Direct): NEGATIVE

## 2014-10-12 MED ORDER — SALINE SPRAY 0.65 % NA SOLN: 1.0000 | NASAL | Status: DC | PRN

## 2014-10-12 MED ORDER — FLUTICASONE PROPIONATE 50 MCG/ACT NA SUSP: 2.0000 | Freq: Every day | NASAL | Status: DC

## 2014-10-12 MED ORDER — PSEUDOEPHEDRINE HCL ER 120 MG PO TB12: 120.0000 mg | ORAL_TABLET | Freq: Two times a day (BID) | ORAL | Status: DC

## 2014-10-12 NOTE — Discharge Instructions (Signed)
Take Sudafed, Flonase as prescribed. Use nasal saline intermittently throughout the day. Follow-up with both your primary care physician and dentist.  Upper Respiratory Infection, Adult An upper respiratory infection (URI) is also sometimes known as the common cold. The upper respiratory tract includes the nose, sinuses, throat, trachea, and bronchi. Bronchi are the airways leading to the lungs. Most people improve within 1 week, but symptoms can last up to 2 weeks. A residual cough may last even longer.  CAUSES Many different viruses can infect the tissues lining the upper respiratory tract. The tissues become irritated and inflamed and often become very moist. Mucus production is also common. A cold is contagious. You can easily spread the virus to others by oral contact. This includes kissing, sharing a glass, coughing, or sneezing. Touching your mouth or nose and then touching a surface, which is then touched by another person, can also spread the virus. SYMPTOMS  Symptoms typically develop 1 to 3 days after you come in contact with a cold virus. Symptoms vary from person to person. They may include:  Runny nose.  Sneezing.  Nasal congestion.  Sinus irritation.  Sore throat.  Loss of voice (laryngitis).  Cough.  Fatigue.  Muscle aches.  Loss of appetite.  Headache.  Low-grade fever. DIAGNOSIS  You might diagnose your own cold based on familiar symptoms, since most people get a cold 2 to 3 times a year. Your caregiver can confirm this based on your exam. Most importantly, your caregiver can check that your symptoms are not due to another disease such as strep throat, sinusitis, pneumonia, asthma, or epiglottitis. Blood tests, throat tests, and X-rays are not necessary to diagnose a common cold, but they may sometimes be helpful in excluding other more serious diseases. Your caregiver will decide if any further tests are required. RISKS AND COMPLICATIONS  You may be at risk for a  more severe case of the common cold if you smoke cigarettes, have chronic heart disease (such as heart failure) or lung disease (such as asthma), or if you have a weakened immune system. The very young and very old are also at risk for more serious infections. Bacterial sinusitis, middle ear infections, and bacterial pneumonia can complicate the common cold. The common cold can worsen asthma and chronic obstructive pulmonary disease (COPD). Sometimes, these complications can require emergency medical care and may be life-threatening. PREVENTION  The best way to protect against getting a cold is to practice good hygiene. Avoid oral or hand contact with people with cold symptoms. Wash your hands often if contact occurs. There is no clear evidence that vitamin C, vitamin E, echinacea, or exercise reduces the chance of developing a cold. However, it is always recommended to get plenty of rest and practice good nutrition. TREATMENT  Treatment is directed at relieving symptoms. There is no cure. Antibiotics are not effective, because the infection is caused by a virus, not by bacteria. Treatment may include:  Increased fluid intake. Sports drinks offer valuable electrolytes, sugars, and fluids.  Breathing heated mist or steam (vaporizer or shower).  Eating chicken soup or other clear broths, and maintaining good nutrition.  Getting plenty of rest.  Using gargles or lozenges for comfort.  Controlling fevers with ibuprofen or acetaminophen as directed by your caregiver.  Increasing usage of your inhaler if you have asthma. Zinc gel and zinc lozenges, taken in the first 24 hours of the common cold, can shorten the duration and lessen the severity of symptoms. Pain medicines may help  with fever, muscle aches, and throat pain. A variety of non-prescription medicines are available to treat congestion and runny nose. Your caregiver can make recommendations and may suggest nasal or lung inhalers for other  symptoms.  HOME CARE INSTRUCTIONS   Only take over-the-counter or prescription medicines for pain, discomfort, or fever as directed by your caregiver.  Use a warm mist humidifier or inhale steam from a shower to increase air moisture. This may keep secretions moist and make it easier to breathe.  Drink enough water and fluids to keep your urine clear or pale yellow.  Rest as needed.  Return to work when your temperature has returned to normal or as your caregiver advises. You may need to stay home longer to avoid infecting others. You can also use a face mask and careful hand washing to prevent spread of the virus. SEEK MEDICAL CARE IF:   After the first few days, you feel you are getting worse rather than better.  You need your caregiver's advice about medicines to control symptoms.  You develop chills, worsening shortness of breath, or brown or red sputum. These may be signs of pneumonia.  You develop yellow or brown nasal discharge or pain in the face, especially when you bend forward. These may be signs of sinusitis.  You develop a fever, swollen neck glands, pain with swallowing, or white areas in the back of your throat. These may be signs of strep throat. SEEK IMMEDIATE MEDICAL CARE IF:   You have a fever.  You develop severe or persistent headache, ear pain, sinus pain, or chest pain.  You develop wheezing, a prolonged cough, cough up blood, or have a change in your usual mucus (if you have chronic lung disease).  You develop sore muscles or a stiff neck. Document Released: 11/20/2000 Document Revised: 08/19/2011 Document Reviewed: 09/01/2013 [facility] Patient Information 2015 [place], [place]. This information is not intended to replace advice given to you by your health care provider. Make sure you discuss any questions you have with your health care provider.  Impacted Molar Molars are the teeth in the back of your mouth. You have 12 molars. There are 6 molars in each  jaw, 3 on each side. When they grow in (erupt) they sometimes cause problems. Molars trapped inside the gum are impacted molars. Impacted molars may grow sideways, tilted, or may only partially emerge. Molars erupt at different times in life. The first set of molars usually erupts around 69 to 26 years of age. The second set of molars typically erupts around 53 to 26 years of age. The third set of molars are called wisdom teeth. These molars usually do not have enough space to erupt properly. Many teens and young adults develop impacted wisdom teeth and have them surgically removed (extracted). However, any molar or set of molars may become impacted. CAUSES  Teeth that are crowded are often the reason for an impacted molar, but sometimes a cyst or tumor may cause impaction of molars. SYMPTOMS  Sometimes there are no symptoms and an impacted molar is noticed during an exam or X-ray. If there are symptoms they may include:  Pain.  Swelling, redness, or inflammation near the impacted tooth or teeth.  Stiff jaw.  General feeling of illness.  Bad breath.  Gap between the teeth.  Difficulty opening your mouth.  Headache or jaw ache.  Swollen lymph nodes. Impacted teeth may increase the risk of complications such as:  Infection, with possible drainage around the infected area.  Damage to nearby teeth.  Growth of cysts.  Chronic discomfort. DIAGNOSIS  Impacted molars are diagnosed by oral exam and X-rays. TREATMENT  The goal of treatment is to obtain the best possible arrangement of your teeth. Your dentist or orthodontist will recommend the best course of action for you. After an exam, your caregiver may recommend one or a combination of the following treatments.  Supportive home care to manage pain and other symptoms until treatment can be started.  Surgical extraction of one or a combination of molars to leave room for emerging or later molars. Teeth must be extracted at  appropriate times for the best results.  Surgical uncovering of tissue covering the impacted molar.  Orthodontic repositioning with the use of appliances such as elastic or metal separators, braces, wires, springs, and other removable or fixed devices. This is done to guide the molar and surrounding teeth to grow in properly. In some cases, you may need some surgery to assist this procedure. Follow-up orthodontic treatment is often necessary with impacted first and second molars.  Antibiotics to treat infection. HOME CARE INSTRUCTIONS Rinse as directed with an antibacterial solution or salt and warm water. Follow up with your caregiver as directed, even if you do not have symptoms. If you are waiting for treatment and have pain:  Take pain medicines as directed.  Take your antibiotics as directed. Finish them even if you start to feel better.  Put ice on the affected area.  Put ice in a plastic bag.  Place a towel between your skin and the bag.  Leave the ice on for 15-20 minutes, 03-04 times a day. SEEK DENTAL CARE IF:  You have a fever.  Pain emerges, worsens, or is not controlled by the medicines you were given.  Swelling occurs.  You have difficulty opening your mouth or swallowing. MAKE SURE YOU:   Understand these instructions.  Will watch your condition.  Will get help right away if you are not doing well or get worse. Document Released: 01/23/2011 Document Revised: 08/19/2011 Document Reviewed: 01/23/2011 [facility] Patient Information 2015 [place], [place]. This information is not intended to replace advice given to you by your health care provider. Make sure you discuss any questions you have with your health care provider.

## 2014-10-12 NOTE — ED Provider Notes (Signed)
CSN: [id number]     Arrival date & time 10/12/14  1709 History   First MD Initiated Contact with Patient 10/12/14 [place]     Chief Complaint  Patient presents with  . Headache  . Dental Pain     (Consider location/radiation/quality/duration/timing/severity/associated sxs/prior Treatment) HPI Comments: 24 year old female complaining of right-sided headache, right-sided facial pressure, right lower posterior dental pain, chills and congestion 1 day. States she thought it was her allergies, and took a Zyrtec with no relief. Endorses mild sore throat. Denies cough, nausea, vomiting, fever. Right-sided facial pain 8/10. No aggravating or alleviating factors. States it looks like there is a "piece of meet" over her molar.  Patient is a 24 y.o. female presenting with headaches and tooth pain. The history is provided by the patient.  Headache Associated symptoms: congestion and neck pain (R sided)   Dental Pain Associated symptoms: congestion, headaches and neck pain (R sided)     Past Medical History  Diagnosis Date  . IBS (irritable bowel syndrome)   . H/O varicella 09/10/11  . Low iron 09/10/11  . Yeast infection 09/10/11  . Bacterial infection 09/10/11  . Trichomonas 09/10/10  . Hypotension   . Crohn's disease    Past Surgical History  Procedure Laterality Date  . Cesarean section  09/10/11    NRFHT - primary low transverse cesarean section   . Cesarean section N/A 09/04/2013    Procedure: CESAREAN SECTION- repeat;  Surgeon: Osborne Oman, MD;  Location: [facility];  Service: Obstetrics;  Laterality: N/A;   Family History  Problem Relation Age of Onset  . Hypertension Mother   . Hypertension Father    History  Substance Use Topics  . Smoking status: Current Every Day Smoker -- 0.10 packs/day for 8 years    Types: Cigarettes  . Smokeless tobacco: Never Used  . Alcohol Use: No     Comment: occ   OB History    Gravida Para Term Preterm AB TAB SAB Ectopic Multiple Living   3 2 2  0 1 0 1  0 0 2     Review of Systems  Constitutional: Positive for chills.  HENT: Positive for congestion and dental problem.   Musculoskeletal: Positive for neck pain (R sided).  Neurological: Positive for headaches.  All other systems reviewed and are negative.     Allergies  Lactose intolerance (gi) and Flagyl  Home Medications   Prior to Admission medications   Medication Sig Start Date End Date Taking? Authorizing Provider  acetaminophen (TYLENOL) 500 MG tablet Take 1 tablet (500 mg total) by mouth every 6 (six) hours as needed. 02/03/14  Yes Peter Dammen, PA-C  alum & mag hydroxide-simeth (MAALOX/MYLANTA) 200-200-20 MG/5ML suspension Take 30 mLs by mouth every 6 (six) hours as needed for indigestion or heartburn (indigestion).    Yes Historical Provider, MD  cetirizine (ZYRTEC) 10 MG tablet Take 10 mg by mouth at bedtime as needed for allergies (allergies).    Yes Historical Provider, MD  IRON PO Take 1 tablet by mouth daily.   Yes Historical Provider, MD  LO LOESTRIN FE 1 MG-10 MCG / 10 MCG tablet Take 1 tablet by mouth daily.  08/03/14  Yes Historical Provider, MD  predniSONE (DELTASONE) 10 MG tablet Take 2 tablets (20 mg total) by mouth 2 (two) times daily. 05/31/14  Yes Milus Banister, MD  predniSONE (DELTASONE) 10 MG tablet As directed for patient she has instructions 08/01/14  Yes Lori P Hvozdovic, PA-C  Prenatal Vit-Fe Fumarate-FA (  PRENATAL COMPLETE) 14-0.4 MG TABS Take 1 tablet by mouth daily. 05/19/14  Yes Luvenia Redden, PA-C  prenatal vitamin w/FE, FA (PRENATAL 1 + 1) 27-1 MG TABS tablet Take 1 tablet by mouth daily.  08/18/14  Yes Historical Provider, MD  azaTHIOprine (IMURAN) 50 MG tablet Take 3 tablets (150 mg total) by mouth daily. Patient not taking: Reported on 10/12/2014 06/15/14   Milus Banister, MD  famotidine (PEPCID) 20 MG tablet Take 1 tablet (20 mg total) by mouth 2 (two) times daily. Patient taking differently: Take 20 mg by mouth as needed.  09/11/13 09/11/14  Manya Silvas, CNM  fluticasone (FLONASE) 50 MCG/ACT nasal spray Place 2 sprays into both nostrils daily. 10/12/14   Carman Ching, PA-C  methocarbamol (ROBAXIN) 500 MG tablet Take 2 tablets (1,000 mg total) by mouth 4 (four) times daily as needed (Pain). Patient not taking: Reported on 10/12/2014 06/27/14   Elmyra Ricks Pisciotta, PA-C  pseudoephedrine (SUDAFED 12 HOUR) 120 MG 12 hr tablet Take 1 tablet (120 mg total) by mouth 2 (two) times daily. 10/12/14   Carman Ching, PA-C  sodium chloride (OCEAN) 0.65 % SOLN nasal spray Place 1 spray into both nostrils as needed for congestion. 10/12/14   Ian Castagna M Tresea Heine, PA-C   BP 122/70 mmHg  Pulse 83  Temp(Src) 99.4 F (37.4 C) (Oral)  Resp 16  Ht 5' 2"  (1.575 m)  Wt 185 lb (83.915 kg)  BMI 33.83 kg/m2  SpO2 100%  LMP 10/06/2014 (Exact Date) Physical Exam  Constitutional: She is oriented to person, place, and time. She appears well-developed and well-nourished. No distress.  HENT:  Head: Normocephalic and atraumatic.  Nose: Mucosal edema present.  Mouth/Throat: Uvula is midline and mucous membranes are normal. Posterior oropharyngeal erythema present. No oropharyngeal exudate or posterior oropharyngeal edema.  Impacted appearing R lower 3rd molar. No erythema or gingival swelling. No abscess. R TM retracted, no erythema or injection. No MEF.  Eyes: Conjunctivae and EOM are normal.  Neck: Normal range of motion. Neck supple.  Cardiovascular: Normal rate, regular rhythm and normal heart sounds.   Pulmonary/Chest: Effort normal and breath sounds normal. No respiratory distress.  Musculoskeletal: Normal range of motion. She exhibits no edema.  Lymphadenopathy:    She has cervical adenopathy (bilateral).  Neurological: She is alert and oriented to person, place, and time. No sensory deficit.  Skin: Skin is warm and dry.  Psychiatric: She has a normal mood and affect. Her behavior is normal.  Nursing note and vitals reviewed.   ED Course  Procedures (including  critical care time) Labs Review Labs Reviewed  RAPID STREP SCREEN  CULTURE, GROUP A STREP    Imaging Review No results found.   EKG Interpretation None      MDM   Final diagnoses:  Sinus congestion  Impacted third molar tooth   Non-toxic appearing, NAD. VSS. Rapid strep negative. No facial swelling. No dental infection. Nasal congestion, mucosal edema, and R TM retraction present evident of sinus congestion. Regarding molar, no signs of infection. Advised her to f/u with her dentist and PCP. Stable for d/c. Return precautions given. Patient states understanding of treatment care plan and is agreeable.  Carman Ching, PA-C 10/12/14 1907  Daleen Bo, MD 10/13/14 [phone]

## 2014-10-12 NOTE — ED Notes (Addendum)
Pt c/o R side headache that has progressed to R head, face, dental, and neck pain x 1 day.  Pain score 8/10.  Sts "I thought it was my allergies."  Pt took Zyrtec w/o relief.

## 2014-10-15 LAB — CULTURE, GROUP A STREP: STREP A CULTURE: NEGATIVE

## 2015-01-13 ENCOUNTER — Emergency Department (HOSPITAL_COMMUNITY)
Admission: EM | Admit: 2015-01-13 | Discharge: 2015-01-13 | Disposition: A | Discharge status: Home / Self Care | Payer: Medicaid Other | Attending: Emergency Medicine | Admitting: Emergency Medicine

## 2015-01-13 ENCOUNTER — Encounter (HOSPITAL_COMMUNITY): Payer: Self-pay | Admitting: *Deleted

## 2015-01-13 DIAGNOSIS — Z79899 Other long term (current) drug therapy: Secondary | ICD-10-CM | POA: Diagnosis not present

## 2015-01-13 DIAGNOSIS — Z7951 Long term (current) use of inhaled steroids: Secondary | ICD-10-CM | POA: Insufficient documentation

## 2015-01-13 DIAGNOSIS — F419 Anxiety disorder, unspecified: Secondary | ICD-10-CM | POA: Diagnosis not present

## 2015-01-13 DIAGNOSIS — Z8619 Personal history of other infectious and parasitic diseases: Secondary | ICD-10-CM | POA: Diagnosis not present

## 2015-01-13 DIAGNOSIS — Z8719 Personal history of other diseases of the digestive system: Secondary | ICD-10-CM | POA: Insufficient documentation

## 2015-01-13 DIAGNOSIS — Z8679 Personal history of other diseases of the circulatory system: Secondary | ICD-10-CM | POA: Insufficient documentation

## 2015-01-13 DIAGNOSIS — Z720 Tobacco use: Secondary | ICD-10-CM | POA: Diagnosis not present

## 2015-01-13 DIAGNOSIS — E860 Dehydration: Secondary | ICD-10-CM | POA: Diagnosis not present

## 2015-01-13 LAB — COMPREHENSIVE METABOLIC PANEL
ALK PHOS: 72 U/L (ref 38–126)
ALT: 21 U/L (ref 14–54)
AST: 36 U/L (ref 15–41)
Albumin: 3.6 g/dL (ref 3.5–5.0)
Anion gap: 6 (ref 5–15)
BILIRUBIN TOTAL: 1 mg/dL (ref 0.3–1.2)
BUN: 7 mg/dL (ref 6–20)
CO2: 24 mmol/L (ref 22–32)
Calcium: 8.9 mg/dL (ref 8.9–10.3)
Chloride: 108 mmol/L (ref 101–111)
Creatinine, Ser: 1.02 mg/dL — ABNORMAL HIGH (ref 0.44–1.00)
GFR calc Af Amer: >60 mL/min (ref >60)
GFR calc non Af Amer: >60 mL/min (ref >60)
Glucose, Bld: 69 mg/dL (ref 65–99)
POTASSIUM: 3.8 mmol/L (ref 3.5–5.1)
SODIUM: 138 mmol/L (ref 135–145)
TOTAL PROTEIN: 8 g/dL (ref 6.5–8.1)

## 2015-01-13 LAB — CBC
HCT: 35.2 % — ABNORMAL LOW (ref 36.0–46.0)
Hemoglobin: 11.5 g/dL — ABNORMAL LOW (ref 12.0–15.0)
MCH: 28.5 pg (ref 26.0–34.0)
MCHC: 32.7 g/dL (ref 30.0–36.0)
MCV: 87.1 fL (ref 78.0–100.0)
Platelets: 335 10*3/uL (ref 150–400)
RBC: 4.04 MIL/uL (ref 3.87–5.11)
RDW: 13.2 % (ref 11.5–15.5)
WBC: 9.2 10*3/uL (ref 4.0–10.5)

## 2015-01-13 LAB — I-STAT BETA HCG BLOOD, ED (MC, WL, AP ONLY): I-stat hCG, quantitative: 6.4 m[IU]/mL — ABNORMAL HIGH (ref <5)

## 2015-01-13 LAB — HCG, QUANTITATIVE, PREGNANCY: hCG, Beta Chain, Quant, S: <1 m[IU]/mL (ref <5)

## 2015-01-13 MED ORDER — ONDANSETRON HCL 4 MG/2ML IJ SOLN
4.0000 mg | Freq: Once | INTRAMUSCULAR | Status: AC
  Administered 2015-01-13: 4 mg via INTRAVENOUS
  Filled 2015-01-13: qty 2

## 2015-01-13 MED ORDER — SODIUM CHLORIDE 0.9 % IV BOLUS (SEPSIS)
1000.0000 mL | Freq: Once | INTRAVENOUS | Status: AC
  Administered 2015-01-13: 1000 mL via INTRAVENOUS

## 2015-01-13 NOTE — ED Notes (Signed)
Bed: QM57 Expected date:  Expected time:  Means of arrival:  Comments: EMS- F, crohn's and anxiety

## 2015-01-13 NOTE — ED Provider Notes (Signed)
CSN: [id number]     Arrival date & time 01/13/15  1216 History   First MD Initiated Contact with Patient 01/13/15 1220     Chief Complaint  Patient presents with  . Anxiety      HPI Patient presents to the emergency department with increased stressors in her life.  No homicidal or suicidal thoughts.  She reports she feels tearful.  She has Crohn's disease.  She reports mild dehydration and generalized weakness.  She reports occasional nausea.  She denies vomiting.  No abdominal pain.   Past Medical History  Diagnosis Date  . IBS (irritable bowel syndrome)   . H/O varicella 09/10/11  . Low iron 09/10/11  . Yeast infection 09/10/11  . Bacterial infection 09/10/11  . Trichomonas 09/10/10  . Hypotension   . Crohn's disease    Past Surgical History  Procedure Laterality Date  . Cesarean section  09/10/11    NRFHT - primary low transverse cesarean section   . Cesarean section N/A 09/04/2013    Procedure: CESAREAN SECTION- repeat;  Surgeon: Osborne Oman, MD;  Location: [facility];  Service: Obstetrics;  Laterality: N/A;   Family History  Problem Relation Age of Onset  . Hypertension Mother   . Hypertension Father    History  Substance Use Topics  . Smoking status: Current Every Day Smoker -- 0.10 packs/day for 8 years    Types: Cigarettes  . Smokeless tobacco: Never Used  . Alcohol Use: No     Comment: occ   OB History    Gravida Para Term Preterm AB TAB SAB Ectopic Multiple Living   3 2 2  0 1 0 1 0 0 2     Review of Systems  All other systems reviewed and are negative.     Allergies  Lactose intolerance (gi) and Flagyl  Home Medications   Prior to Admission medications   Medication Sig Start Date End Date Taking? Authorizing Provider  acetaminophen (TYLENOL) 500 MG tablet Take 1 tablet (500 mg total) by mouth every 6 (six) hours as needed. 02/03/14  Yes Peter Dammen, PA-C  alum & mag hydroxide-simeth (MAALOX/MYLANTA) 200-200-20 MG/5ML suspension Take 30 mLs by mouth every 6  (six) hours as needed for indigestion or heartburn (indigestion).    Yes Historical Provider, MD  cetirizine (ZYRTEC) 10 MG tablet Take 10 mg by mouth at bedtime as needed for allergies (allergies).    Yes Historical Provider, MD  fluticasone (FLONASE) 50 MCG/ACT nasal spray Place 2 sprays into both nostrils daily. Patient taking differently: Place 2 sprays into both nostrils daily as needed for allergies.  10/12/14  Yes Robyn M Hess, PA-C  IRON PO Take 1 tablet by mouth daily.   Yes Historical Provider, MD  LO LOESTRIN FE 1 MG-10 MCG / 10 MCG tablet Take 1 tablet by mouth daily.  08/03/14  Yes Historical Provider, MD  predniSONE (DELTASONE) 10 MG tablet Take 2 tablets (20 mg total) by mouth 2 (two) times daily. Patient taking differently: Take 20 mg by mouth 2 (two) times daily as needed (for crohns disease, pt states she takes 2 tablets daily at night, but if she feels worse she will take twice daily. states she doesn't take it as prescribed due to increase of apetite and weight gain.).  05/31/14  Yes Milus Banister, MD  Prenatal Vit-Fe Fumarate-FA (PRENATAL COMPLETE) 14-0.4 MG TABS Take 1 tablet by mouth daily. 05/19/14  Yes Luvenia Redden, PA-C  prenatal vitamin w/FE, FA (PRENATAL 1 +  1) 27-1 MG TABS tablet Take 1 tablet by mouth daily.  08/18/14  Yes Historical Provider, MD  sodium chloride (OCEAN) 0.65 % SOLN nasal spray Place 1 spray into both nostrils as needed for congestion. 10/12/14  Yes Robyn M Hess, PA-C   BP 135/75 mmHg  Pulse 71  Temp(Src) 98.1 F (36.7 C) (Oral)  Resp 18  SpO2 100%  LMP 12/27/2014 Physical Exam  Constitutional: She is oriented to person, place, and time. She appears well-developed and well-nourished. No distress.  HENT:  Head: Normocephalic and atraumatic.  Eyes: EOM are normal.  Neck: Normal range of motion.  Cardiovascular: Normal rate, regular rhythm and normal heart sounds.   Pulmonary/Chest: Effort normal and breath sounds normal.  Abdominal: Soft. She  exhibits no distension. There is no tenderness.  Musculoskeletal: Normal range of motion.  Neurological: She is alert and oriented to person, place, and time.  Skin: Skin is warm and dry.  Psychiatric: She has a normal mood and affect. Judgment normal.  Nursing note and vitals reviewed.   ED Course  Procedures (including critical care time) Labs Review Labs Reviewed  CBC - Abnormal; Notable for the following:    Hemoglobin 11.5 (*)    HCT 35.2 (*)    All other components within normal limits  COMPREHENSIVE METABOLIC PANEL - Abnormal; Notable for the following:    Creatinine, Ser 1.02 (*)    All other components within normal limits  I-STAT BETA HCG BLOOD, ED ([facility], [facility], [facility] ONLY) - Abnormal; Notable for the following:    I-stat hCG, quantitative 6.4 (*)    All other components within normal limits  HCG, QUANTITATIVE, PREGNANCY    Imaging Review No results found.   EKG Interpretation None      MDM   Final diagnoses:  Anxiety  Dehydration    Patient is overall well-appearing.  She is feeling much better at this time.  Discharge home in good condition.    Jola Schmidt, MD 01/13/15 [phone]

## 2015-01-13 NOTE — ED Notes (Addendum)
Pt feels "stressed out, dealing and working on some things at home"  C/o dizziness, nausea, tired, "out of it", dehydrated. Denies vomiting. Diarrhea this morning which is not unusual due to her Crohn's disease.   Pt was dx with Crohn's 05/31/14 Having difficulty with landlord, worried about the health of her children   Pt sees therapist on a weekly basis but is not taking any medications for anxiety at this time.  Pt support system includes her fiance' who she describes as a great support person.  She also has parents and a sister who are also very supportive.

## 2015-01-16 ENCOUNTER — Ambulatory Visit (INDEPENDENT_AMBULATORY_CARE_PROVIDER_SITE_OTHER): Payer: Medicaid Other | Admitting: Gastroenterology

## 2015-01-16 ENCOUNTER — Encounter: Payer: Self-pay | Admitting: Gastroenterology

## 2015-01-16 ENCOUNTER — Other Ambulatory Visit: Payer: Medicaid Other

## 2015-01-16 VITALS — BP 110/60 | HR 68 | Ht 61.25 in | Wt 186.5 lb

## 2015-01-16 DIAGNOSIS — K50918 Crohn's disease, unspecified, with other complication: Secondary | ICD-10-CM

## 2015-01-16 DIAGNOSIS — K50119 Crohn's disease of large intestine with unspecified complications: Secondary | ICD-10-CM | POA: Diagnosis not present

## 2015-01-16 MED ORDER — ADALIMUMAB 40 MG/0.8ML ~~LOC~~ AJKT
80.0000 mg | AUTO-INJECTOR | Freq: Once | SUBCUTANEOUS | Status: DC
Start: 2015-01-16 — End: 2015-05-17

## 2015-01-16 MED ORDER — ADALIMUMAB 40 MG/0.8ML ~~LOC~~ AJKT: 160.0000 mg | AUTO-INJECTOR | Freq: Once | SUBCUTANEOUS | Status: DC

## 2015-01-16 MED ORDER — ADALIMUMAB 40 MG/0.8ML ~~LOC~~ AJKT: 40.0000 mg | AUTO-INJECTOR | SUBCUTANEOUS | Status: DC

## 2015-01-16 NOTE — Progress Notes (Signed)
Review of pertinent gastrointestinal problems: 1. Crohn's ileitis: presented with diarrhea 2015, elevated sed rate; Colonoscopy 05/2014 [name] found completely normal colon but ulcerated terminal ileum; biopsies TI showed chronic inflammation, biopsies of right and left colon were normal. Started on prednisone 10 twice daily with good response. TPMT enzyme activity 05/2014 was normal.  Azathiaprine 162m daily was started 06/2014; could not taper steroids below 150mper day, repeat CT scan showed distal 1/3 of ileum was thickened.  She stopped azathioprine due to swelling in her legs, we asked her to continue however she adamantly declined.   HPI: This is a very pleasant 24 year old woman with Crohn's ileitis  Chief complaint is diarrhea, abdominal pains, Crohn's disease  Has been on prednisone for 8-9 months.  Cannot taper past 26 per day.  Currently on 88m36mice daily.  Says azathiaprine caused swelling in her legs.  Took this for about a week.  She sill has LLQ pains intermittently.  Stools are always loose, diarrheal (at least 3 times per day).  + nocturnal.  Past Medical History  Diagnosis Date  . IBS (irritable bowel syndrome)   . H/O varicella 09/10/11  . Low iron 09/10/11  . Yeast infection 09/10/11  . Bacterial infection 09/10/11  . Trichomonas 09/10/10  . Hypotension   . Crohn's disease     Past Surgical History  Procedure Laterality Date  . Cesarean section  09/10/11    NRFHT - primary low transverse cesarean section   . Cesarean section N/A 09/04/2013    Procedure: CESAREAN SECTION- repeat;  Surgeon: UgonOsborne Oman;  [place];  Service: Obstetrics;  Laterality: N/A;    Current Outpatient Prescriptions  Medication Sig Dispense Refill  . acetaminophen (TYLENOL) 500 MG tablet Take 1 tablet (500 mg total) by mouth every 6 (six) hours as needed. 30 tablet 0  . alum & mag hydroxide-simeth (MAALOX/MYLANTA) 200-200-20 MG/5ML suspension Take 30 mLs by mouth every 6 (six)  hours as needed for indigestion or heartburn (indigestion).     . cetirizine (ZYRTEC) 10 MG tablet Take 10 mg by mouth at bedtime as needed for allergies (allergies).     . fluticasone (FLONASE) 50 MCG/ACT nasal spray Place 2 sprays into both nostrils daily. (Patient taking differently: Place 2 sprays into both nostrils daily as needed for allergies. ) 16 g 0  . IRON PO Take 1 tablet by mouth daily.    . LO LOESTRIN FE 1 MG-10 MCG / 10 MCG tablet Take 1 tablet by mouth daily.   4  . predniSONE (DELTASONE) 10 MG tablet Take 2 tablets (20 mg total) by mouth 2 (two) times daily. (Patient taking differently: Take 20 mg by mouth 2 (two) times daily as needed (for crohns disease, pt states she takes 2 tablets daily at night, but if she feels worse she will take twice daily. states she doesn't take it as prescribed due to increase of apetite and weight gain.). ) 100 tablet 1  . Prenatal Vit-Fe Fumarate-FA (PRENATAL COMPLETE) 14-0.4 MG TABS Take 1 tablet by mouth daily. 30 each 11  . prenatal vitamin w/FE, FA (PRENATAL 1 + 1) 27-1 MG TABS tablet Take 1 tablet by mouth daily.   11  . sodium chloride (OCEAN) 0.65 % SOLN nasal spray Place 1 spray into both nostrils as needed for congestion. 88 mL 0   No current facility-administered medications for this visit.    Allergies as of 01/16/2015 - Review Complete 01/16/2015  Allergen Reaction Noted  . Lactose  intolerance (gi) Diarrhea and Nausea And Vomiting 01/14/2013  . Flagyl [metronidazole] Itching and Rash 07/03/2013    Family History  Problem Relation Age of Onset  . Hypertension Mother   . Hypertension Father     History   Social History  . Marital Status: Single    Spouse Name: N/A  . Number of Children: 2  . Years of Education: N/A   Occupational History  . Homemaker    Social History Main Topics  . Smoking status: Current Every Day Smoker -- 0.10 packs/day for 8 years    Types: Cigarettes  . Smokeless tobacco: Never Used  . Alcohol  Use: No     Comment: occ  . Drug Use: Yes    Special: Marijuana     Comment: denies 02/03/14  . Sexual Activity: Yes    Birth Control/ Protection: None   Other Topics Concern  . Not on file   Social History Narrative     Physical Exam: BP 110/60 mmHg  Pulse 68  Ht 5' 1.25" (1.556 m)  Wt 186 lb 8 oz (84.596 kg)  BMI 34.94 kg/m2  LMP 12/27/2014  Breastfeeding? No Constitutional: generally well-appearing Psychiatric: alert and oriented x3 Abdomen: soft, nontender, nondistended, no obvious ascites, no peritoneal signs, normal bowel sounds   Assessment and plan: 24 y.o. female with Crohn's ileitis steroid-dependent  She really didn't give azathioprine much of a try despite my recommendations and she is not inclined to try again now. She has read about [employer] and is interested in hearing more. We spoke about the pros and cons including risk of infection, lymphoma however a very small risk. I had already asked her to get TB testing and hepatitis B testing however she never had those labs drawn. We'll ask that she do that again in order to get her ready to start Biologics pule. We will look to start Humira at the usual loading dose and then eventual 40 mg every other week dose. She'll return to see me in 6-8 weeks and sooner if needed   Owens Loffler, MD [facility] 01/16/2015, 1:44 PM

## 2015-01-16 NOTE — Patient Instructions (Addendum)
One of your biggest health concerns is your smoking.  This increases your risk for most cancers and serious cardiovascular diseases such as strokes, heart attacks.  You should try your best to stop.  If you need assistance, please contact your PCP or Smoking Cessation Class at [facility] [phone]) or [place] (1-800-QUIT-NOW). SMOKING IS ALSO TERRIBLE FOR CROHN'S DISEASE; CAN MAKE THE CROHN'S VERY DIFFICULT TO CONTROL Please get the hepatitis and TB testing already ordered. We will work on [employer] and start you as soon as ready (for usual loading protocol then 40 every other week).  Order sent to [facility] Please return to see Dr. Yates Decamp 03/21/15 at 3:30 pm Stay on prednisone for now.

## 2015-01-17 LAB — HEPATITIS B SURFACE ANTIBODY,QUALITATIVE: HEP B S AB: POSITIVE — AB

## 2015-01-17 LAB — HEPATITIS B SURFACE ANTIGEN: Hepatitis B Surface Ag: NEGATIVE

## 2015-01-18 LAB — QUANTIFERON TB GOLD ASSAY (BLOOD)
Interferon Gamma Release Assay: NEGATIVE
Mitogen value: >10 IU/mL
Quantiferon Nil Value: 0.06 IU/mL
Quantiferon Tb Ag Minus Nil Value: 0.02 IU/mL
TB Ag value: 0.08 IU/mL

## 2015-01-29 ENCOUNTER — Emergency Department (HOSPITAL_COMMUNITY)
Admission: EM | Admit: 2015-01-29 | Discharge: 2015-01-29 | Disposition: A | Discharge status: Home / Self Care | Payer: Medicaid Other | Attending: Emergency Medicine | Admitting: Emergency Medicine

## 2015-01-29 ENCOUNTER — Encounter (HOSPITAL_COMMUNITY): Payer: Self-pay | Admitting: Emergency Medicine

## 2015-01-29 DIAGNOSIS — K5090 Crohn's disease, unspecified, without complications: Secondary | ICD-10-CM | POA: Diagnosis not present

## 2015-01-29 DIAGNOSIS — E611 Iron deficiency: Secondary | ICD-10-CM | POA: Insufficient documentation

## 2015-01-29 DIAGNOSIS — Z7951 Long term (current) use of inhaled steroids: Secondary | ICD-10-CM | POA: Insufficient documentation

## 2015-01-29 DIAGNOSIS — R110 Nausea: Secondary | ICD-10-CM | POA: Diagnosis not present

## 2015-01-29 DIAGNOSIS — Z8619 Personal history of other infectious and parasitic diseases: Secondary | ICD-10-CM | POA: Diagnosis not present

## 2015-01-29 DIAGNOSIS — Z79899 Other long term (current) drug therapy: Secondary | ICD-10-CM | POA: Diagnosis not present

## 2015-01-29 DIAGNOSIS — I959 Hypotension, unspecified: Secondary | ICD-10-CM | POA: Diagnosis not present

## 2015-01-29 DIAGNOSIS — Z720 Tobacco use: Secondary | ICD-10-CM | POA: Insufficient documentation

## 2015-01-29 DIAGNOSIS — R1011 Right upper quadrant pain: Secondary | ICD-10-CM | POA: Diagnosis not present

## 2015-01-29 DIAGNOSIS — Z7952 Long term (current) use of systemic steroids: Secondary | ICD-10-CM | POA: Insufficient documentation

## 2015-01-29 DIAGNOSIS — R1013 Epigastric pain: Secondary | ICD-10-CM | POA: Insufficient documentation

## 2015-01-29 LAB — CBC WITH DIFFERENTIAL/PLATELET
BASOS PCT: 0 % (ref 0–1)
Basophils Absolute: 0 10*3/uL (ref 0.0–0.1)
EOS ABS: 0.1 10*3/uL (ref 0.0–0.7)
Eosinophils Relative: 1 % (ref 0–5)
HEMATOCRIT: 39.1 % (ref 36.0–46.0)
Hemoglobin: 13 g/dL (ref 12.0–15.0)
LYMPHS ABS: 2.3 10*3/uL (ref 0.7–4.0)
Lymphocytes Relative: 25 % (ref 12–46)
MCH: 28.8 pg (ref 26.0–34.0)
MCHC: 33.2 g/dL (ref 30.0–36.0)
MCV: 86.5 fL (ref 78.0–100.0)
MONOS PCT: 7 % (ref 3–12)
Monocytes Absolute: 0.6 10*3/uL (ref 0.1–1.0)
Neutro Abs: 6.2 10*3/uL (ref 1.7–7.7)
Neutrophils Relative %: 67 % (ref 43–77)
Platelets: 370 10*3/uL (ref 150–400)
RBC: 4.52 MIL/uL (ref 3.87–5.11)
RDW: 13.2 % (ref 11.5–15.5)
WBC: 9.2 10*3/uL (ref 4.0–10.5)

## 2015-01-29 LAB — COMPREHENSIVE METABOLIC PANEL
ALBUMIN: 4 g/dL (ref 3.5–5.0)
ALK PHOS: 85 U/L (ref 38–126)
ALT: 20 U/L (ref 14–54)
AST: 22 U/L (ref 15–41)
Anion gap: 11 (ref 5–15)
BUN: 12 mg/dL (ref 6–20)
CALCIUM: 10.2 mg/dL (ref 8.9–10.3)
CO2: 22 mmol/L (ref 22–32)
Chloride: 105 mmol/L (ref 101–111)
Creatinine, Ser: 1.09 mg/dL — ABNORMAL HIGH (ref 0.44–1.00)
GFR calc non Af Amer: >60 mL/min (ref >60)
GLUCOSE: 104 mg/dL — AB (ref 65–99)
POTASSIUM: 3.8 mmol/L (ref 3.5–5.1)
SODIUM: 138 mmol/L (ref 135–145)
TOTAL PROTEIN: 8.4 g/dL — AB (ref 6.5–8.1)
Total Bilirubin: 0.8 mg/dL (ref 0.3–1.2)

## 2015-01-29 LAB — LIPASE, BLOOD: Lipase: 20 U/L — ABNORMAL LOW (ref 22–51)

## 2015-01-29 MED ORDER — FENTANYL CITRATE (PF) 100 MCG/2ML IJ SOLN
50.0000 ug | Freq: Once | INTRAMUSCULAR | Status: DC
  Filled 2015-01-29: qty 2

## 2015-01-29 MED ORDER — GI COCKTAIL ~~LOC~~
30.0000 mL | Freq: Once | ORAL | Status: AC
  Administered 2015-01-29: 30 mL via ORAL
  Filled 2015-01-29: qty 30

## 2015-01-29 MED ORDER — ONDANSETRON 8 MG PO TBDP: 8.0000 mg | ORAL_TABLET | Freq: Three times a day (TID) | ORAL | Status: DC | PRN

## 2015-01-29 MED ORDER — SUCRALFATE 1 G PO TABS: 1.0000 g | ORAL_TABLET | Freq: Four times a day (QID) | ORAL | Status: DC

## 2015-01-29 MED ORDER — SODIUM CHLORIDE 0.9 % IV BOLUS (SEPSIS)
1000.0000 mL | Freq: Once | INTRAVENOUS | Status: AC
  Administered 2015-01-29: 1000 mL via INTRAVENOUS

## 2015-01-29 MED ORDER — PANTOPRAZOLE SODIUM 40 MG IV SOLR
40.0000 mg | Freq: Once | INTRAVENOUS | Status: AC
  Administered 2015-01-29: 40 mg via INTRAVENOUS
  Filled 2015-01-29: qty 40

## 2015-01-29 MED ORDER — ONDANSETRON HCL 4 MG/2ML IJ SOLN
4.0000 mg | Freq: Once | INTRAMUSCULAR | Status: AC
  Administered 2015-01-29: 4 mg via INTRAVENOUS
  Filled 2015-01-29: qty 2

## 2015-01-29 MED ORDER — PANTOPRAZOLE SODIUM 20 MG PO TBEC: 40.0000 mg | DELAYED_RELEASE_TABLET | Freq: Every day | ORAL | Status: DC

## 2015-01-29 NOTE — ED Notes (Addendum)
Pt BIB EMS from home, pt c/o epigastric pain x 2 days. +n/diarrhea. Back pain.  Pt appears anxious, tearful.

## 2015-01-29 NOTE — ED Provider Notes (Signed)
CSN: [id number]     Arrival date & time 01/29/15  0423 History   First MD Initiated Contact with Patient 01/29/15 [phone]     Chief Complaint  Patient presents with  . Abdominal Pain     (Consider location/radiation/quality/duration/timing/severity/associated sxs/prior Treatment) HPI 24 year old female presents to the emergency department with complaint of epigastric pain starting Friday.  Patient has had nausea but no vomiting, reports diarrhea which is chronic with her Crohn's disease.  Patient reports she is on chronic steroids but has not taken them since Friday due to the abdominal pain.  She has tried Maalox and times with some relief.  Patient reports similar symptoms after the birth of her child, seen at [facility] hospital, received GI cocktail which helped.  Patient seen by Dr. [name] with gastroenterology.  Has had colonoscopy, but not endoscopy.  She is awaiting transition from steroids to [facility] No prior history of GERD or reflux.  She is not on a PPI or H2 blocker.  No fevers or chills.  Pain is epigastric, some radiation into her back.  She describes it as sharp and burning.  Patient's last menstrual period started today.  She is on oral contraception.  She has not missed any doses of her pills  Past Medical History  Diagnosis Date  . IBS (irritable bowel syndrome)   . H/O varicella 09/10/11  . Low iron 09/10/11  . Yeast infection 09/10/11  . Bacterial infection 09/10/11  . Trichomonas 09/10/10  . Hypotension   . Crohn's disease    Past Surgical History  Procedure Laterality Date  . Cesarean section  09/10/11    NRFHT - primary low transverse cesarean section   . Cesarean section N/A 09/04/2013    Procedure: CESAREAN SECTION- repeat;  Surgeon: Osborne Oman, MD;  Location: [facility];  Service: Obstetrics;  Laterality: N/A;   Family History  Problem Relation Age of Onset  . Hypertension Mother   . Hypertension Father    Social History  Substance Use Topics  . Smoking status: Current  Every Day Smoker -- 0.10 packs/day for 8 years    Types: Cigarettes  . Smokeless tobacco: Never Used  . Alcohol Use: No     Comment: occ   OB History    Gravida Para Term Preterm AB TAB SAB Ectopic Multiple Living   3 2 2  0 1 0 1 0 0 2     Review of Systems  See History of Present Illness; otherwise all other systems are reviewed and negative   Allergies  Lactose intolerance (gi) and Flagyl  Home Medications   Prior to Admission medications   Medication Sig Start Date End Date Taking? Authorizing Provider  acetaminophen (TYLENOL) 500 MG tablet Take 1 tablet (500 mg total) by mouth every 6 (six) hours as needed. 02/03/14   Hazel Sams, PA-C  Adalimumab (HUMIRA PEN-CROHNS STARTER) 40 MG/0.8ML PNKT Inject 160 mg into the skin once. 01/16/15   Milus Banister, MD  Adalimumab (HUMIRA PEN-CROHNS STARTER) 40 MG/0.8ML PNKT Inject 80 mg into the skin once. Day 15 (after injection #1) 01/16/15   Milus Banister, MD  Adalimumab [facility] PEN-CROHNS STARTER) 40 MG/0.8ML PNKT Inject 40 mg into the skin every 14 (fourteen) days. 01/16/15   Milus Banister, MD  alum & mag hydroxide-simeth (MAALOX/MYLANTA) 200-200-20 MG/5ML suspension Take 30 mLs by mouth every 6 (six) hours as needed for indigestion or heartburn (indigestion).     Historical Provider, MD  cetirizine (ZYRTEC) 10 MG tablet Take 10  mg by mouth at bedtime as needed for allergies (allergies).     Historical Provider, MD  fluticasone (FLONASE) 50 MCG/ACT nasal spray Place 2 sprays into both nostrils daily. Patient taking differently: Place 2 sprays into both nostrils daily as needed for allergies.  10/12/14   Carman Ching, PA-C  IRON PO Take 1 tablet by mouth daily.    Historical Provider, MD  LO LOESTRIN FE 1 MG-10 MCG / 10 MCG tablet Take 1 tablet by mouth daily.  08/03/14   Historical Provider, MD  predniSONE (DELTASONE) 10 MG tablet Take 2 tablets (20 mg total) by mouth 2 (two) times daily. Patient taking differently: Take 20 mg by mouth 2  (two) times daily as needed (for crohns disease, pt states she takes 2 tablets daily at night, but if she feels worse she will take twice daily. states she doesn't take it as prescribed due to increase of apetite and weight gain.).  05/31/14   Milus Banister, MD  Prenatal Vit-Fe Fumarate-FA (PRENATAL COMPLETE) 14-0.4 MG TABS Take 1 tablet by mouth daily. 05/19/14   Luvenia Redden, PA-C  prenatal vitamin w/FE, FA (PRENATAL 1 + 1) 27-1 MG TABS tablet Take 1 tablet by mouth daily.  08/18/14   Historical Provider, MD  sodium chloride (OCEAN) 0.65 % SOLN nasal spray Place 1 spray into both nostrils as needed for congestion. 10/12/14   Robyn M Hess, PA-C   BP 146/97 mmHg  Pulse 112  Temp(Src) 99.1 F (37.3 C) (Oral)  Resp 21  Ht 5' 2"  (1.575 m)  SpO2 100%  LMP 01/27/2015 Physical Exam  Constitutional: She is oriented to person, place, and time. She appears well-developed and well-nourished. She appears distressed (tearful, anxious, in pain).  HENT:  Head: Normocephalic and atraumatic.  Nose: Nose normal.  Mouth/Throat: Oropharynx is clear and moist.  Eyes: Conjunctivae and EOM are normal. Pupils are equal, round, and reactive to light.  Neck: Normal range of motion. Neck supple. No JVD present. No tracheal deviation present. No thyromegaly present.  Cardiovascular: Normal rate, regular rhythm, normal heart sounds and intact distal pulses.  Exam reveals no gallop and no friction rub.   No murmur heard. Pulmonary/Chest: Effort normal and breath sounds normal. No stridor. No respiratory distress. She has no wheezes. She has no rales. She exhibits no tenderness.  Abdominal: Soft. She exhibits no distension and no mass. There is tenderness (tenderness in right upper quadrant and epigastrium). There is no rebound and no guarding.  Bowel sounds.  Mildly increased  Musculoskeletal: Normal range of motion. She exhibits no edema or tenderness.  Lymphadenopathy:    She has no cervical adenopathy.   Neurological: She is alert and oriented to person, place, and time. She displays normal reflexes. She exhibits normal muscle tone. Coordination normal.  Skin: Skin is warm and dry. No rash noted. No erythema. No pallor.  Psychiatric: She has a normal mood and affect. Her behavior is normal. Judgment and thought content normal.  Nursing note and vitals reviewed.   ED Course  Procedures (including critical care time) Labs Review Labs Reviewed  COMPREHENSIVE METABOLIC PANEL - Abnormal; Notable for the following:    Glucose, Bld 104 (*)    Creatinine, Ser 1.09 (*)    Total Protein 8.4 (*)    All other components within normal limits  LIPASE, BLOOD - Abnormal; Notable for the following:    Lipase 20 (*)    All other components within normal limits  CBC WITH DIFFERENTIAL/PLATELET  URINALYSIS, ROUTINE W REFLEX MICROSCOPIC (NOT AT [facility])  PREGNANCY, URINE    Imaging Review No results found. I have personally reviewed and evaluated these images and lab results as part of my medical decision-making.   EKG Interpretation None      MDM   Final diagnoses:  Epigastric pain   24 year old female with 2 days of epigastric pain.  Differential includes pancreatitis, gastritis, cholelithiasis/cholecystitis.  Mild elevation in temperature.  Plan for labs, IV fluids, patient offered pain medication to this time she refuses.  Will get GI cocktail, Zofran and Protonix.  Pt feeling better, pain resolved. Will have her f/u with her gastroenterologist   Linton Flemings, MD 01/29/15 [phone]

## 2015-01-29 NOTE — ED Notes (Signed)
Pt. Refused for Fentanyl IV inj , stated that she just needs "antiacid' meds for her heartburn, claimed that she was told to receive one . Stated that she is afraid to have that "rush feeling or being high" if  Narcotic is given.

## 2015-01-29 NOTE — ED Notes (Signed)
Bed: [facility]3 Expected date:  Expected time:  Means of arrival:  Comments: EMS 23yo F, N/V/D abd pain

## 2015-01-29 NOTE — ED Notes (Signed)
Pt. Reminded of collecting urine for U/A, verbalized understanding .

## 2015-01-29 NOTE — Discharge Instructions (Signed)
Take medications as prescribed.  Avoid spicy or acidic foods.  Follow-up with her gastroenterologist.  Return to the emergency department for worsening condition   Abdominal Pain Many things can cause abdominal pain. Usually, abdominal pain is not caused by a disease and will improve without treatment. It can often be observed and treated at home. Your health care provider will do a physical exam and possibly order blood tests and X-rays to help determine the seriousness of your pain. However, in many cases, more time must pass before a clear cause of the pain can be found. Before that point, your health care provider may not know if you need more testing or further treatment. HOME CARE INSTRUCTIONS  Monitor your abdominal pain for any changes. The following actions may help to alleviate any discomfort you are experiencing:  Only take over-the-counter or prescription medicines as directed by your health care provider.  Do not take laxatives unless directed to do so by your health care provider.  Try a clear liquid diet (broth, tea, or water) as directed by your health care provider. Slowly move to a bland diet as tolerated. SEEK MEDICAL CARE IF:  You have unexplained abdominal pain.  You have abdominal pain associated with nausea or diarrhea.  You have pain when you urinate or have a bowel movement.  You experience abdominal pain that wakes you in the night.  You have abdominal pain that is worsened or improved by eating food.  You have abdominal pain that is worsened with eating fatty foods.  You have a fever. SEEK IMMEDIATE MEDICAL CARE IF:   Your pain does not go away within 2 hours.  You keep throwing up (vomiting).  Your pain is felt only in portions of the abdomen, such as the right side or the left lower portion of the abdomen.  You pass bloody or black tarry stools. MAKE SURE YOU:  Understand these instructions.   Will watch your condition.   Will get help right  away if you are not doing well or get worse.  Document Released: 03/06/2005 Document Revised: 06/01/2013 Document Reviewed: 02/03/2013 [employer] Patient Information 2015 [place], [place]. This information is not intended to replace advice given to you by your health care provider. Make sure you discuss any questions you have with your health care provider.

## 2015-01-31 ENCOUNTER — Telehealth: Payer: Self-pay | Admitting: Gastroenterology

## 2015-01-31 NOTE — Telephone Encounter (Signed)
[facility] called to get an update on humira, they stated that the prescription was transferred to [facility] at  [phone], also an ambassador was also set up.  Messages were left for the pt to call [facility] and set up delivery.  [facility] will call the pt again and give her the above info.  I verified that the pt received the info.

## 2015-02-01 ENCOUNTER — Telehealth: Payer: Self-pay | Admitting: Gastroenterology

## 2015-02-02 NOTE — Telephone Encounter (Signed)
Pt was notified that [facility] has tried to reach her in regards to the [place] program for teaching of the injections.  She was given the number to call and will let me know if she can not get in touch with them.  Per [facility] they have tried numerous times to reach the pt.

## 2015-02-09 ENCOUNTER — Telehealth: Payer: Self-pay | Admitting: Gastroenterology

## 2015-02-10 NOTE — Telephone Encounter (Signed)
Pt given the number for [employer].

## 2015-03-21 ENCOUNTER — Ambulatory Visit: Payer: Self-pay | Admitting: Gastroenterology

## 2015-04-16 ENCOUNTER — Emergency Department (HOSPITAL_COMMUNITY)
Admission: EM | Admit: 2015-04-16 | Discharge: 2015-04-16 | Disposition: A | Discharge status: Home / Self Care | Payer: Medicaid Other | Attending: Emergency Medicine | Admitting: Emergency Medicine

## 2015-04-16 ENCOUNTER — Encounter (HOSPITAL_COMMUNITY): Payer: Self-pay | Admitting: Emergency Medicine

## 2015-04-16 DIAGNOSIS — Z720 Tobacco use: Secondary | ICD-10-CM | POA: Insufficient documentation

## 2015-04-16 DIAGNOSIS — D841 Defects in the complement system: Secondary | ICD-10-CM | POA: Insufficient documentation

## 2015-04-16 DIAGNOSIS — Z3202 Encounter for pregnancy test, result negative: Secondary | ICD-10-CM | POA: Diagnosis not present

## 2015-04-16 DIAGNOSIS — Z7951 Long term (current) use of inhaled steroids: Secondary | ICD-10-CM | POA: Insufficient documentation

## 2015-04-16 DIAGNOSIS — R05 Cough: Secondary | ICD-10-CM | POA: Insufficient documentation

## 2015-04-16 DIAGNOSIS — Z8619 Personal history of other infectious and parasitic diseases: Secondary | ICD-10-CM | POA: Diagnosis not present

## 2015-04-16 DIAGNOSIS — R110 Nausea: Secondary | ICD-10-CM

## 2015-04-16 DIAGNOSIS — D649 Anemia, unspecified: Secondary | ICD-10-CM | POA: Insufficient documentation

## 2015-04-16 DIAGNOSIS — D509 Iron deficiency anemia, unspecified: Secondary | ICD-10-CM | POA: Diagnosis not present

## 2015-04-16 DIAGNOSIS — Z79899 Other long term (current) drug therapy: Secondary | ICD-10-CM | POA: Diagnosis not present

## 2015-04-16 DIAGNOSIS — K5090 Crohn's disease, unspecified, without complications: Secondary | ICD-10-CM | POA: Diagnosis not present

## 2015-04-16 DIAGNOSIS — Z7952 Long term (current) use of systemic steroids: Secondary | ICD-10-CM | POA: Insufficient documentation

## 2015-04-16 DIAGNOSIS — R197 Diarrhea, unspecified: Secondary | ICD-10-CM

## 2015-04-16 DIAGNOSIS — R5383 Other fatigue: Secondary | ICD-10-CM

## 2015-04-16 DIAGNOSIS — R52 Pain, unspecified: Secondary | ICD-10-CM | POA: Diagnosis present

## 2015-04-16 LAB — BASIC METABOLIC PANEL
ANION GAP: 6 (ref 5–15)
BUN: 8 mg/dL (ref 6–20)
CHLORIDE: 108 mmol/L (ref 101–111)
CO2: 23 mmol/L (ref 22–32)
Calcium: 9.6 mg/dL (ref 8.9–10.3)
Creatinine, Ser: 0.93 mg/dL (ref 0.44–1.00)
GFR calc Af Amer: >60 mL/min (ref >60)
GLUCOSE: 95 mg/dL (ref 65–99)
Potassium: 3.8 mmol/L (ref 3.5–5.1)
Sodium: 137 mmol/L (ref 135–145)

## 2015-04-16 LAB — CBC WITH DIFFERENTIAL/PLATELET
Basophils Absolute: 0 10*3/uL (ref 0.0–0.1)
Basophils Relative: 0 %
Eosinophils Absolute: 0 10*3/uL (ref 0.0–0.7)
Eosinophils Relative: 1 %
HEMATOCRIT: 38.8 % (ref 36.0–46.0)
Hemoglobin: 13.6 g/dL (ref 12.0–15.0)
LYMPHS PCT: 53 %
Lymphs Abs: 2.5 10*3/uL (ref 0.7–4.0)
MCH: 29.7 pg (ref 26.0–34.0)
MCHC: 35.1 g/dL (ref 30.0–36.0)
MCV: 84.7 fL (ref 78.0–100.0)
Monocytes Absolute: 0.3 10*3/uL (ref 0.1–1.0)
Monocytes Relative: 6 %
NEUTROS ABS: 1.9 10*3/uL (ref 1.7–7.7)
Neutrophils Relative %: 40 %
PLATELETS: 251 10*3/uL (ref 150–400)
RBC: 4.58 MIL/uL (ref 3.87–5.11)
RDW: 13.4 % (ref 11.5–15.5)
WBC: 4.7 10*3/uL (ref 4.0–10.5)

## 2015-04-16 LAB — URINALYSIS, ROUTINE W REFLEX MICROSCOPIC
Bilirubin Urine: NEGATIVE
GLUCOSE, UA: NEGATIVE mg/dL
Hgb urine dipstick: NEGATIVE
KETONES UR: NEGATIVE mg/dL
LEUKOCYTES UA: NEGATIVE
NITRITE: NEGATIVE
PH: 5.5 (ref 5.0–8.0)
Protein, ur: NEGATIVE mg/dL
SPECIFIC GRAVITY, URINE: 1.023 (ref 1.005–1.030)
Urobilinogen, UA: 0.2 mg/dL (ref 0.0–1.0)

## 2015-04-16 LAB — POC URINE PREG, ED: Preg Test, Ur: NEGATIVE

## 2015-04-16 MED ORDER — ONDANSETRON HCL 4 MG/2ML IJ SOLN
4.0000 mg | Freq: Once | INTRAMUSCULAR | Status: AC
  Administered 2015-04-16: 4 mg via INTRAVENOUS
  Filled 2015-04-16: qty 2

## 2015-04-16 MED ORDER — DEXAMETHASONE SODIUM PHOSPHATE 10 MG/ML IJ SOLN
10.0000 mg | Freq: Once | INTRAMUSCULAR | Status: AC
  Administered 2015-04-16: 10 mg via INTRAVENOUS
  Filled 2015-04-16: qty 1

## 2015-04-16 MED ORDER — SODIUM CHLORIDE 0.9 % IV BOLUS (SEPSIS)
1000.0000 mL | Freq: Once | INTRAVENOUS | Status: AC
  Administered 2015-04-16: 1000 mL via INTRAVENOUS

## 2015-04-16 MED ORDER — PREDNISONE 20 MG PO TABS: ORAL_TABLET | ORAL | Status: DC

## 2015-04-16 MED ORDER — ONDANSETRON HCL 8 MG PO TABS: 8.0000 mg | ORAL_TABLET | Freq: Three times a day (TID) | ORAL | Status: DC | PRN

## 2015-04-16 NOTE — ED Notes (Signed)
Pt. Stated, I have Crohns disease and it flared up abou 3 days ago.  I feel bad all over with body aches.

## 2015-04-16 NOTE — ED Notes (Signed)
Vital signs stable. 

## 2015-04-16 NOTE — ED Provider Notes (Signed)
CSN: [id number]     Arrival date & time 04/16/15  1017 History   First MD Initiated Contact with Patient 04/16/15 1134     Chief Complaint  Patient presents with  . Generalized Body Aches  . Crohn's Disease     (Consider location/radiation/quality/duration/timing/severity/associated sxs/prior Treatment) HPI Comments: Sharon Clayton is a 24 y.o. female with a PMHx of Crohn's disease on humira, anemia, hypotension, and remote BV/trichomonas, who presents to the ED with complaints of general fatigue and not feeling well 3 days. She reports nausea and chills as well as 3 days of 2-3 episodes of daily watery nonbloody diarrhea. She states she has Crohn's, she has recently been transitioned to [facility] and was taken off prednisone 2 months ago, and initially she did very well with Humira but she has noticed more frequent diarrhea over the last 3 days. She states she is also getting over a cold, stating that she had a cough last week which is gradually improved. She also quit smoking 2 days ago and is unsure whether this could be contributing to her overall fatigue today. She reports she had a "crabby mood" yesterday. She denies any fevers, pain, chest pain, shortness of breath, abdominal pain, vomiting, constipation, obstipation, melena, hematochezia, dysuria, hematuria, vaginal wittier discharge, numbness, tingling, or focal weakness. Denies any recent travel, sick contacts, suspicious food intake, alcohol use, NSAID use, or recent antibiotics. LMP was 10/18. Her GI specialist is Dr. [name] at [facility].   Patient is a 24 y.o. female presenting with diarrhea. The history is provided by the patient. No language interpreter was used.  Diarrhea Quality:  Semi-solid and watery Severity:  Mild Onset quality:  Gradual Number of episodes:  2-3x/day Duration:  3 days Timing:  Intermittent Progression:  Unchanged Relieved by:  None tried Worsened by:  Nothing tried Ineffective treatments:  None  tried Associated symptoms: chills and cough (improving)   Associated symptoms: no abdominal pain, no arthralgias, no fever, no myalgias and no vomiting   Risk factors: no recent antibiotic use, no sick contacts, no suspicious food intake and no travel to endemic areas     Past Medical History  Diagnosis Date  . IBS (irritable bowel syndrome)   . H/O varicella 09/10/11  . Low iron 09/10/11  . Yeast infection 09/10/11  . Bacterial infection 09/10/11  . Trichomonas 09/10/10  . Hypotension   . Crohn's disease [facility])    Past Surgical History  Procedure Laterality Date  . Cesarean section  09/10/11    NRFHT - primary low transverse cesarean section   . Cesarean section N/A 09/04/2013    Procedure: CESAREAN SECTION- repeat;  Surgeon: Osborne Oman, MD;  Location: [place] [facility];  Service: Obstetrics;  Laterality: N/A;   Family History  Problem Relation Age of Onset  . Hypertension Mother   . Hypertension Father    Social History  Substance Use Topics  . Smoking status: Current Every Day Smoker -- 0.10 packs/day for 8 years    Types: Cigarettes  . Smokeless tobacco: Never Used  . Alcohol Use: No     Comment: occ   OB History    Gravida Para Term Preterm AB TAB SAB Ectopic Multiple Living   3 2 2  0 1 0 1 0 0 2     Review of Systems  Constitutional: Positive for chills and fatigue. Negative for fever.  HENT: Negative for ear discharge, ear pain, rhinorrhea and sore throat.   Respiratory: Positive for cough (improving). Negative for shortness  of breath.   Cardiovascular: Negative for chest pain.  Gastrointestinal: Positive for nausea and diarrhea. Negative for vomiting, abdominal pain, constipation and blood in stool.  Genitourinary: Negative for dysuria, hematuria, vaginal bleeding and vaginal discharge.  Musculoskeletal: Negative for myalgias and arthralgias.  Skin: Negative for color change.  Allergic/Immunologic: Positive for immunocompromised state (on humira).  Neurological: Negative for  weakness and numbness.  Psychiatric/Behavioral: Negative for confusion.   10 Systems reviewed and are negative for acute change except as noted in the HPI.    Allergies  Lactose intolerance (gi) and Flagyl  Home Medications   Prior to Admission medications   Medication Sig Start Date End Date Taking? Authorizing Provider  acetaminophen (TYLENOL) 500 MG tablet Take 1 tablet (500 mg total) by mouth every 6 (six) hours as needed. 02/03/14   Hazel Sams, PA-C  Adalimumab (HUMIRA PEN-CROHNS STARTER) 40 MG/0.8ML PNKT Inject 160 mg into the skin once. Patient not taking: Reported on 01/29/2015 01/16/15   Milus Banister, MD  Adalimumab [facility] PEN-CROHNS STARTER) 40 MG/0.8ML PNKT Inject 80 mg into the skin once. Day 15 (after injection #1) 01/16/15   Milus Banister, MD  Adalimumab [facility] PEN-CROHNS STARTER) 40 MG/0.8ML PNKT Inject 40 mg into the skin every 14 (fourteen) days. 01/16/15   Milus Banister, MD  alum & mag hydroxide-simeth (MAALOX/MYLANTA) 200-200-20 MG/5ML suspension Take 30 mLs by mouth every 6 (six) hours as needed for indigestion or heartburn (indigestion).     Historical Provider, MD  cetirizine (ZYRTEC) 10 MG tablet Take 10 mg by mouth at bedtime as needed for allergies (allergies).     Historical Provider, MD  fluticasone (FLONASE) 50 MCG/ACT nasal spray Place 2 sprays into both nostrils daily. Patient taking differently: Place 2 sprays into both nostrils daily as needed for allergies.  10/12/14   Carman Ching, PA-C  IRON PO Take 1 tablet by mouth daily.    Historical Provider, MD  LO LOESTRIN FE 1 MG-10 MCG / 10 MCG tablet Take 1 tablet by mouth daily.  08/03/14   Historical Provider, MD  ondansetron (ZOFRAN ODT) 8 MG disintegrating tablet Take 1 tablet (8 mg total) by mouth every 8 (eight) hours as needed for nausea or vomiting. 01/29/15   Linton Flemings, MD  pantoprazole (PROTONIX) 20 MG tablet Take 2 tablets (40 mg total) by mouth daily. 01/29/15   Linton Flemings, MD  predniSONE (DELTASONE)  10 MG tablet Take 2 tablets (20 mg total) by mouth 2 (two) times daily. Patient taking differently: Take 20 mg by mouth 2 (two) times daily as needed (for crohns disease, pt states she takes 2 tablets daily at night, but if she feels worse she will take twice daily. states she doesn't take it as prescribed due to increase of apetite and weight gain.).  05/31/14   Milus Banister, MD  Prenatal Vit-Fe Fumarate-FA (PRENATAL COMPLETE) 14-0.4 MG TABS Take 1 tablet by mouth daily. 05/19/14   Luvenia Redden, PA-C  prenatal vitamin w/FE, FA (PRENATAL 1 + 1) 27-1 MG TABS tablet Take 1 tablet by mouth daily.  08/18/14   Historical Provider, MD  sodium chloride (OCEAN) 0.65 % SOLN nasal spray Place 1 spray into both nostrils as needed for congestion. 10/12/14   Carman Ching, PA-C  sucralfate (CARAFATE) 1 G tablet Take 1 tablet (1 g total) by mouth 4 (four) times daily. 01/29/15   Linton Flemings, MD   BP 118/77 mmHg  Pulse 77  Temp(Src) 99.2 F (37.3 C) (Oral)  Resp 18  SpO2 100%  LMP 03/28/2015 Physical Exam  Constitutional: She is oriented to person, place, and time. Vital signs are normal. She appears well-developed and well-nourished.  Non-toxic appearance. No distress.  Afebrile, nontoxic, NAD  HENT:  Head: Normocephalic and atraumatic.  Mouth/Throat: Oropharynx is clear and moist and mucous membranes are normal.  Eyes: Conjunctivae and EOM are normal. Right eye exhibits no discharge. Left eye exhibits no discharge.  Neck: Normal range of motion. Neck supple.  Cardiovascular: Normal rate, regular rhythm, normal heart sounds and intact distal pulses.  Exam reveals no gallop and no friction rub.   No murmur heard. Pulmonary/Chest: Effort normal and breath sounds normal. No respiratory distress. She has no decreased breath sounds. She has no wheezes. She has no rhonchi. She has no rales.  Abdominal: Soft. Normal appearance and bowel sounds are normal. She exhibits no distension. There is no tenderness. There  is no rigidity, no rebound, no guarding, no CVA tenderness, no tenderness at McBurney's point and negative Murphy's sign.  Soft, NTND, +BS throughout, no r/g/r, neg murphy's, neg mcburney's, no CVA TTP   Musculoskeletal: Normal range of motion.  MAE x4 Strength and sensation grossly intact Distal pulses intact Gait steady  Neurological: She is alert and oriented to person, place, and time. She has normal strength. No sensory deficit.  Skin: Skin is warm, dry and intact. No rash noted.  Psychiatric: She has a normal mood and affect.  Nursing note and vitals reviewed.   ED Course  Procedures (including critical care time) Labs Review Labs Reviewed  URINALYSIS, ROUTINE W REFLEX MICROSCOPIC (NOT AT [facility]) - Abnormal; Notable for the following:    APPearance CLOUDY (*)    All other components within normal limits  CBC WITH DIFFERENTIAL/PLATELET  BASIC METABOLIC PANEL  POC URINE PREG, ED    Imaging Review No results found. I have personally reviewed and evaluated these images and lab results as part of my medical decision-making.   EKG Interpretation None      MDM   Final diagnoses:  Other fatigue  Nausea  Diarrhea, unspecified type  Crohn's disease without complication, unspecified gastrointestinal tract location [facility])  Currently attempting to quit smoking    24 y.o. female here with general fatigue, nausea, chills, and 3 days of diarrhea. She reports she just feels off. No pain anywhere. She states she's quit smoking 2 days ago, this is the likely cause of some of her symptoms. She does have Crohn's disease, could be having a Crohn's flare, and she is currently getting over cold. She has had a cough which is improving. On exam, afebrile and nontoxic, no abdominal tenderness. CBC and BMP unremarkable. Will check UA and urine pregnancy to ensure these are not a source of her symptoms. She is no longer on prednisone after switching over to Humira, but given that she could be  having a Crohn's flare with her diarrhea will give Decadron and short course of steroids. Patient requesting IV fluids, I feel this is reasonable, will give Zofran as well. Will reassess shortly.  3:15 PM Upreg neg. U/A clear. Pt feeling much better. Tolerating PO. Will d/c home with short course of prednisone and some zofran. F/up with PCP and GI in 1 week. I explained the diagnosis and have given explicit precautions to return to the ER including for any other new or worsening symptoms. The patient understands and accepts the medical plan as it's been dictated and I have answered their questions. Discharge instructions concerning  home care and prescriptions have been given. The patient is STABLE and is discharged to home in good condition.  BP 119/71 mmHg  Pulse 75  Temp(Src) 99.2 F (37.3 C) (Oral)  Resp 18  SpO2 100%  LMP 03/28/2015  Meds ordered this encounter  Medications  . ondansetron (ZOFRAN) injection 4 mg    Sig:   . sodium chloride 0.9 % bolus 1,000 mL    Sig:   . dexamethasone (DECADRON) injection 10 mg    Sig:   . ondansetron (ZOFRAN) 8 MG tablet    Sig: Take 1 tablet (8 mg total) by mouth every 8 (eight) hours as needed for nausea or vomiting.    Dispense:  10 tablet    Refill:  0    Order Specific Question:  Supervising Provider    Answer:  Sabra Heck, BRIAN [[id number]]  . predniSONE (DELTASONE) 20 MG tablet    Sig: 3 tabs po daily x 2 days, 2 tabs po daily x 2 days, 1 tab po daily x 2 days    Dispense:  12 tablet    Refill:  0    Order Specific Question:  Supervising Provider    Answer:  Jenny Reichmann Camprubi-Soms, PA-C 04/16/15 1515  Blanchie Dessert, MD 04/16/15 1609

## 2015-04-16 NOTE — Discharge Instructions (Signed)
Use zofran as prescribed, as needed for nausea. Stay well hydrated with small sips of fluids throughout the day. Take prednisone as directed for your crohn's flare/diarrhea. Follow a BRAT (banana-rice-applesauce-toast) diet as described below for the next 24-48 hours. The 'BRAT' diet is suggested, then progress to diet as tolerated as symptoms abate. Call your gastroenterologist if bloody stools, persistent diarrhea, vomiting, fever or abdominal pain. Follow up with your regular doctor in 1 week, and with your gastroenterologist in 1 week. Return to ER for changing or worsening of symptoms.  Food Choices to Help Relieve Diarrhea When you have diarrhea, the foods you eat and your eating habits are very important. Choosing the right foods and drinks can help relieve diarrhea. Also, because diarrhea can last up to 7 days, you need to replace lost fluids and electrolytes (such as sodium, potassium, and chloride) in order to help prevent dehydration.  WHAT GENERAL GUIDELINES DO I NEED TO FOLLOW?  Slowly drink 1 cup (8 oz) of fluid for each episode of diarrhea. If you are getting enough fluid, your urine will be clear or pale yellow.  Eat starchy foods. Some good choices include white rice, white toast, pasta, low-fiber cereal, baked potatoes (without the skin), saltine crackers, and bagels.  Avoid large servings of any cooked vegetables.  Limit fruit to two servings per day. A serving is  cup or 1 small piece.  Choose foods with less than 2 g of fiber per serving.  Limit fats to less than 8 tsp (38 g) per day.  Avoid fried foods.  Eat foods that have probiotics in them. Probiotics can be found in certain dairy products.  Avoid foods and beverages that may increase the speed at which food moves through the stomach and intestines (gastrointestinal tract). Things to avoid include:  High-fiber foods, such as dried fruit, raw fruits and vegetables, nuts, seeds, and whole grain foods.  Spicy foods  and high-fat foods.  Foods and beverages sweetened with high-fructose corn syrup, honey, or sugar alcohols such as xylitol, sorbitol, and mannitol. WHAT FOODS ARE RECOMMENDED? Grains White rice. White, [place], or pita breads (fresh or toasted), including plain rolls, buns, or bagels. White pasta. Saltine, soda, or graham crackers. Pretzels. Low-fiber cereal. Cooked cereals made with water (such as cornmeal, farina, or cream cereals). Plain muffins. Matzo. Melba toast. Zwieback.  Vegetables Potatoes (without the skin). Strained tomato and vegetable juices. Most well-cooked and canned vegetables without seeds. Tender lettuce. Fruits Cooked or canned applesauce, apricots, cherries, fruit cocktail, grapefruit, peaches, pears, or plums. Fresh bananas, apples without skin, cherries, grapes, cantaloupe, grapefruit, peaches, oranges, or plums.  Meat and Other Protein Products Baked or boiled chicken. Eggs. Tofu. Fish. Seafood. Smooth peanut butter. Ground or well-cooked tender beef, ham, veal, lamb, pork, or poultry.  Dairy Plain yogurt, kefir, and unsweetened liquid yogurt. Lactose-free milk, buttermilk, or soy milk. Plain hard cheese. Beverages Sport drinks. Clear broths. Diluted fruit juices (except prune). Regular, caffeine-free sodas such as ginger ale. Water. Decaffeinated teas. Oral rehydration solutions. Sugar-free beverages not sweetened with sugar alcohols. Other Bouillon, broth, or soups made from recommended foods.  The items listed above may not be a complete list of recommended foods or beverages. Contact your dietitian for more options. WHAT FOODS ARE NOT RECOMMENDED? Grains Whole grain, whole wheat, bran, or rye breads, rolls, pastas, crackers, and cereals. Wild or brown rice. Cereals that contain more than 2 g of fiber per serving. Corn tortillas or taco shells. Cooked or dry oatmeal. Granola. Popcorn. Vegetables  Raw vegetables. Cabbage, broccoli, Brussels sprouts, artichokes, baked  beans, beet greens, corn, kale, legumes, peas, sweet potatoes, and yams. Potato skins. Cooked spinach and cabbage. Fruits Dried fruit, including raisins and dates. Raw fruits. Stewed or dried prunes. Fresh apples with skin, apricots, mangoes, pears, raspberries, and strawberries.  Meat and Other Protein Products Chunky peanut butter. Nuts and seeds. Beans and lentils. Berniece Salines.  Dairy High-fat cheeses. Milk, chocolate milk, and beverages made with milk, such as milk shakes. Cream. Ice cream. Sweets and Desserts Sweet rolls, doughnuts, and sweet breads. Pancakes and waffles. Fats and Oils Butter. Cream sauces. Margarine. Salad oils. Plain salad dressings. Olives. Avocados.  Beverages Caffeinated beverages (such as coffee, tea, soda, or energy drinks). Alcoholic beverages. Fruit juices with pulp. Prune juice. Soft drinks sweetened with high-fructose corn syrup or sugar alcohols. Other Coconut. Hot sauce. Chili powder. Mayonnaise. Gravy. Cream-based or milk-based soups.  The items listed above may not be a complete list of foods and beverages to avoid. Contact your dietitian for more information. WHAT SHOULD I DO IF I BECOME DEHYDRATED? Diarrhea can sometimes lead to dehydration. Signs of dehydration include dark urine and dry mouth and skin. If you think you are dehydrated, you should rehydrate with an oral rehydration solution. These solutions can be purchased at pharmacies, retail stores, or online.  Drink -1 cup (120-240 mL) of oral rehydration solution each time you have an episode of diarrhea. If drinking this amount makes your diarrhea worse, try drinking smaller amounts more often. For example, drink 1-3 tsp (5-15 mL) every 5-10 minutes.  A general rule for staying hydrated is to drink 1-2 L of fluid per day. Talk to your health care provider about the specific amount you should be drinking each day. Drink enough fluids to keep your urine clear or pale yellow. Document Released: 08/17/2003  Document Revised: 06/01/2013 Document Reviewed: 04/19/2013 [facility] Patient Information 2015 [place], [place]. This information is not intended to replace advice given to you by your health care provider. Make sure you discuss any questions you have with your health care provider.   Crohn Disease Crohn disease is a long-lasting (chronic) disease that affects your gastrointestinal (GI) tract. It often causes irritation and swelling (inflammation) in your small intestine and the beginning of your large intestine. However, it can affect any part of your GI tract. Crohn disease is part of a group of illnesses that are known as inflammatory bowel disease (IBD). Crohn disease may start slowly and get worse over time. Symptoms may come and go. They may also disappear for months or even years at a time (remission). CAUSES The exact cause of Crohn disease is not known. It may be a response that causes your body's defense system (immune system) to mistakenly attack healthy cells and tissues (autoimmune response). Your genes and your environment may also play a role. RISK FACTORS You may be at greater risk for Crohn disease if you:  Have other family members with Crohn disease or another IBD.  Use any tobacco products, including cigarettes, chewing tobacco, or electronic cigarettes.  Are in your 62s.  Have [place] European ancestry. SIGNS AND SYMPTOMS The main signs and symptoms of Crohn disease involve your GI tract. These include:  Diarrhea.  Rectal bleeding.  An urgent need to move your bowels.  The feeling that you are not finished having a bowel movement.  Abdominal pain or cramping.  Constipation. General signs and symptoms of Crohn disease may also include:  Unexplained weight loss.  Fatigue.  Fever.  Nausea.  Loss of appetite.  Joint pain  Changes in vision.  Red bumps on your skin. DIAGNOSIS Your health care provider may suspect Crohn disease based on your symptoms and  your medical history. Your health care provider will do a physical exam. You may need to see a health care provider who specializes in diseases of the digestive tract (gastroenterologist). You may also have tests to help your health care providers make a diagnosis. These may include:  Blood tests.  Stool sample tests.  Imaging tests, such as X-rays and CT scans.  Tests to examine the inside of your intestines using a long, flexible tube that has a light and a camera on the end (endoscopy or colonoscopy).  A procedure to take tissue samples from inside your bowel (biopsy) to be examined under a microscope. TREATMENT  There is no cure for Crohn disease. Treatment will focus on managing your symptoms. Crohn disease affects each person differently. Your treatment may include:  Resting your bowels. Drinking only clear liquids or getting nutrition through an IV for a period of time gives your bowels a chance to heal because they are not passing stools.  Medicines. These may be used alone or in combination (combination therapy). These may include antibiotic medicines. You may be given medicines that help to:  Reduce inflammation.  Control your immune system activity.  Fight infections.  Relieve cramps and prevent diarrhea.  Control your pain.  Surgery. You may need surgery if:  Medicines and other treatments are no longer working.  You develop complications from severe Crohn disease.  A section of your intestine becomes so damaged that it needs to be removed. HOME CARE INSTRUCTIONS  Take medicines only as directed by your health care provider.  If you were prescribed an antibiotic medicine, finish it all even if you start to feel better.  Keep all follow-up visits as directed by your health care provider. This is important.  Talk with your health care provider about changing your diet. This may help your symptoms. Your health care provide may recommend changes, such  as:  Drinking more fluids.  Avoiding milk and other foods that contain lactose.  Eating a low-fat diet.  Avoiding high-fiber foods, such as popcorn and nuts.  Avoiding carbonated beverages, such as soda.  Eating smaller meals more often rather than eating large meals.  Keeping a food diary to identify foods that make your symptoms better or worse.  Do not use any tobacco products, including cigarettes, chewing tobacco, or electronic cigarettes. If you need help quitting, ask your health care provider.  Limit alcohol intake to no more than 1 drink per day for nonpregnant women and 2 drinks per day for men. One drink equals 12 ounces of beer, 5 ounces of wine, or 1 ounces of hard liquor.  Exercise daily or as directed by your health care provider. SEEK MEDICAL CARE IF:  You have diarrhea, abdominal cramps, and other gastrointestinal problems that are present almost all of the time.  Your symptoms do not improve with treatment.  You continue to lose weight.  You develop a rash or sores on your skin.  You develop eye problems.  You have a fever.   Your symptoms get worse.  You develop new symptoms. SEEK IMMEDIATE MEDICAL CARE IF:  You have bloody diarrhea.  You develop severe abdominal pain.  You cannot pass stools.   This information is not intended to replace advice given to you by your health care provider. Make sure  you discuss any questions you have with your health care provider.   Document Released: 03/06/2005 Document Revised: 06/17/2014 Document Reviewed: 01/12/2014 Elsevier Interactive Patient Education 2016 [place].  Diarrhea Diarrhea is watery poop (stool). It can make you feel weak, tired, thirsty, or give you a dry mouth (signs of dehydration). Watery poop is a sign of another problem, most often an infection. It often lasts 2-3 days. It can last longer if it is a sign of something serious. Take care of yourself as told by your doctor. HOME CARE    Drink 1 cup (8 ounces) of fluid each time you have watery poop.  Do not drink the following fluids:  Those that contain simple sugars (fructose, glucose, galactose, lactose, sucrose, maltose).  Sports drinks.  Fruit juices.  Whole milk products.  Sodas.  Drinks with caffeine (coffee, tea, soda) or alcohol.  Oral rehydration solution may be used if the doctor says it is okay. You may make your own solution. Follow this recipe:   - teaspoon table salt.   teaspoon baking soda.   teaspoon salt substitute containing potassium chloride.  1 tablespoons sugar.  1 liter (34 ounces) of water.  Avoid the following foods:  High fiber foods, such as raw fruits and vegetables.  Nuts, seeds, and whole grain breads and cereals.   Those that are sweetened with sugar alcohols (xylitol, sorbitol, mannitol).  Try eating the following foods:  Starchy foods, such as rice, toast, pasta, low-sugar cereal, oatmeal, baked potatoes, crackers, and bagels.  Bananas.  Applesauce.  Eat probiotic-rich foods, such as yogurt and milk products that are fermented.  Wash your hands well after each time you have watery poop.  Only take medicine as told by your doctor.  Take a warm bath to help lessen burning or pain from having watery poop. GET HELP RIGHT AWAY IF:   You cannot drink fluids without throwing up (vomiting).  You keep throwing up.  You have blood in your poop, or your poop looks black and tarry.  You do not pee (urinate) in 6-8 hours, or there is only a small amount of very dark pee.  You have belly (abdominal) pain that gets worse or stays in the same spot (localizes).  You are weak, dizzy, confused, or light-headed.  You have a very bad headache.  Your watery poop gets worse or does not get better.  You have a fever or lasting symptoms for more than 2-3 days.  You have a fever and your symptoms suddenly get worse. MAKE SURE YOU:   Understand these  instructions.  Will watch your condition.  Will get help right away if you are not doing well or get worse.   This information is not intended to replace advice given to you by your health care provider. Make sure you discuss any questions you have with your health care provider.   Document Released: 11/13/2007 Document Revised: 06/17/2014 Document Reviewed: 02/02/2012 Elsevier Interactive Patient Education 2016 [employer].  Fatigue Fatigue is feeling tired all of the time, a lack of energy, or a lack of motivation. Occasional or mild fatigue is often a normal response to activity or life in general. However, long-lasting (chronic) or extreme fatigue may indicate an underlying medical condition. HOME CARE INSTRUCTIONS  Watch your fatigue for any changes. The following actions may help to lessen any discomfort you are feeling:  Talk to your health care provider about how much sleep you need each night. Try to get the required amount  every night.  Take medicines only as directed by your health care provider.  Eat a healthy and nutritious diet. Ask your health care provider if you need help changing your diet.  Drink enough fluid to keep your urine clear or pale yellow.  Practice ways of relaxing, such as yoga, meditation, massage therapy, or acupuncture.  Exercise regularly.   Change situations that cause you stress. Try to keep your work and personal routine reasonable.  Do not abuse illegal drugs.  Limit alcohol intake to no more than 1 drink per day for nonpregnant women and 2 drinks per day for men. One drink equals 12 ounces of beer, 5 ounces of wine, or 1 ounces of hard liquor.  Take a multivitamin, if directed by your health care provider. SEEK MEDICAL CARE IF:   Your fatigue does not get better.  You have a fever.   You have unintentional weight loss or gain.  You have headaches.   You have difficulty:   Falling asleep.  Sleeping throughout the  night.  You feel angry, guilty, anxious, or sad.   You are unable to have a bowel movement (constipation).   You skin is dry.   Your legs or another part of your body is swollen.  SEEK IMMEDIATE MEDICAL CARE IF:   You feel confused.   Your vision is blurry.  You feel faint or pass out.   You have a severe headache.   You have severe abdominal, pelvic, or back pain.   You have chest pain, shortness of breath, or an irregular or fast heartbeat.   You are unable to urinate or you urinate less than normal.   You develop abnormal bleeding, such as bleeding from the rectum, vagina, nose, lungs, or nipples.  You vomit blood.   You have thoughts about harming yourself or committing suicide.   You are worried that you might harm someone else.    This information is not intended to replace advice given to you by your health care provider. Make sure you discuss any questions you have with your health care provider.   Document Released: 03/24/2007 Document Revised: 06/17/2014 Document Reviewed: 09/28/2013 Elsevier Interactive Patient Education 2016 [employer] Inc[employer]  Nausea, Adult Nausea means you feel sick to your stomach or need to throw up (vomit). It may be a sign of a more serious problem. If nausea gets worse, you may throw up. If you throw up a lot, you may lose too much body fluid (dehydration). HOME CARE   Get plenty of rest.  Ask your doctor how to replace body fluid losses (rehydrate).  Eat small amounts of food. Sip liquids more often.  Take all medicines as told by your doctor. GET HELP RIGHT AWAY IF:  You have a fever.  You pass out (faint).  You keep throwing up or have blood in your throw up.  You are very weak, have dry lips or a dry mouth, or you are very thirsty (dehydrated).  You have dark or bloody poop (stool).  You have very bad chest or belly (abdominal) pain.  You do not get better after 2 days, or you get worse.  You have a  headache. MAKE SURE YOU:  Understand these instructions.  Will watch your condition.  Will get help right away if you are not doing well or get worse.   This information is not intended to replace advice given to you by your health care provider. Make sure you discuss any questions you have with  your health care provider.   Document Released: 05/16/2011 Document Revised: 08/19/2011 Document Reviewed: 05/16/2011 Elsevier Interactive Patient Education 2016 [employer].  Smoking Cessation, Tips for Success If you are ready to quit smoking, congratulations! You have chosen to help yourself be healthier. Cigarettes bring nicotine, tar, carbon monoxide, and other irritants into your body. Your lungs, heart, and blood vessels will be able to work better without these poisons. There are many different ways to quit smoking. Nicotine gum, nicotine patches, a nicotine inhaler, or nicotine nasal spray can help with physical craving. Hypnosis, support groups, and medicines help break the habit of smoking. WHAT THINGS CAN I DO TO MAKE QUITTING EASIER?  Here are some tips to help you quit for good:  Pick a date when you will quit smoking completely. Tell all of your friends and family about your plan to quit on that date.  Do not try to slowly cut down on the number of cigarettes you are smoking. Pick a quit date and quit smoking completely starting on that day.  Throw away all cigarettes.   Clean and remove all ashtrays from your home, work, and car.  On a card, write down your reasons for quitting. Carry the card with you and read it when you get the urge to smoke.  Cleanse your body of nicotine. Drink enough water and fluids to keep your urine clear or pale yellow. Do this after quitting to flush the nicotine from your body.  Learn to predict your moods. Do not let a bad situation be your excuse to have a cigarette. Some situations in your life might tempt you into wanting a cigarette.  Never  have "just one" cigarette. It leads to wanting another and another. Remind yourself of your decision to quit.  Change habits associated with smoking. If you smoked while driving or when feeling stressed, try other activities to replace smoking. Stand up when drinking your coffee. Brush your teeth after eating. Sit in a different chair when you read the paper. Avoid alcohol while trying to quit, and try to drink fewer caffeinated beverages. Alcohol and caffeine may urge you to smoke.  Avoid foods and drinks that can trigger a desire to smoke, such as sugary or spicy foods and alcohol.  Ask people who smoke not to smoke around you.  Have something planned to do right after eating or having a cup of coffee. For example, plan to take a walk or exercise.  Try a relaxation exercise to calm you down and decrease your stress. Remember, you may be tense and nervous for the first 2 weeks after you quit, but this will pass.  Find new activities to keep your hands busy. Play with a pen, coin, or rubber band. Doodle or draw things on paper.  Brush your teeth right after eating. This will help cut down on the craving for the taste of tobacco after meals. You can also try mouthwash.   Use oral substitutes in place of cigarettes. Try using lemon drops, carrots, cinnamon sticks, or chewing gum. Keep them handy so they are available when you have the urge to smoke.  When you have the urge to smoke, try deep breathing.  Designate your home as a nonsmoking area.  If you are a heavy smoker, ask your health care provider about a prescription for nicotine chewing gum. It can ease your withdrawal from nicotine.  Reward yourself. Set aside the cigarette money you save and buy yourself something nice.  Look for support  from others. Join a support group or smoking cessation program. Ask someone at home or at work to help you with your plan to quit smoking.  Always ask yourself, "Do I need this cigarette or is this  just a reflex?" Tell yourself, "Today, I choose not to smoke," or "I do not want to smoke." You are reminding yourself of your decision to quit.  Do not replace cigarette smoking with electronic cigarettes (commonly called e-cigarettes). The safety of e-cigarettes is unknown, and some may contain harmful chemicals.  If you relapse, do not give up! Plan ahead and think about what you will do the next time you get the urge to smoke. HOW WILL I FEEL WHEN I QUIT SMOKING? You may have symptoms of withdrawal because your body is used to nicotine (the addictive substance in cigarettes). You may crave cigarettes, be irritable, feel very hungry, cough often, get headaches, or have difficulty concentrating. The withdrawal symptoms are only temporary. They are strongest when you first quit but will go away within 10-14 days. When withdrawal symptoms occur, stay in control. Think about your reasons for quitting. Remind yourself that these are signs that your body is healing and getting used to being without cigarettes. Remember that withdrawal symptoms are easier to treat than the major diseases that smoking can cause.  Even after the withdrawal is over, expect periodic urges to smoke. However, these cravings are generally short lived and will go away whether you smoke or not. Do not smoke! WHAT RESOURCES ARE AVAILABLE TO HELP ME QUIT SMOKING? Your health care provider can direct you to community resources or hospitals for support, which may include:  Group support.  Education.  Hypnosis.  Therapy.   This information is not intended to replace advice given to you by your health care provider. Make sure you discuss any questions you have with your health care provider.   Document Released: 02/23/2004 Document Revised: 06/17/2014 Document Reviewed: 11/12/2012 Elsevier Interactive Patient Education [employer].

## 2015-05-17 ENCOUNTER — Encounter: Payer: Self-pay | Admitting: Gastroenterology

## 2015-05-17 ENCOUNTER — Other Ambulatory Visit (INDEPENDENT_AMBULATORY_CARE_PROVIDER_SITE_OTHER): Payer: Self-pay

## 2015-05-17 ENCOUNTER — Ambulatory Visit (INDEPENDENT_AMBULATORY_CARE_PROVIDER_SITE_OTHER): Payer: Medicaid Other | Admitting: Gastroenterology

## 2015-05-17 VITALS — BP 94/60 | HR 72 | Ht 61.25 in | Wt 186.0 lb

## 2015-05-17 DIAGNOSIS — K50119 Crohn's disease of large intestine with unspecified complications: Secondary | ICD-10-CM

## 2015-05-17 LAB — COMPREHENSIVE METABOLIC PANEL
ALK PHOS: 56 U/L (ref 39–117)
ALT: 17 U/L (ref 0–35)
AST: 18 U/L (ref 0–37)
Albumin: 3.8 g/dL (ref 3.5–5.2)
BILIRUBIN TOTAL: 0.9 mg/dL (ref 0.2–1.2)
BUN: 7 mg/dL (ref 6–23)
CO2: 26 meq/L (ref 19–32)
CREATININE: 0.85 mg/dL (ref 0.40–1.20)
Calcium: 9.2 mg/dL (ref 8.4–10.5)
Chloride: 107 mEq/L (ref 96–112)
GFR: 103.78 mL/min (ref >60.00)
GLUCOSE: 74 mg/dL (ref 70–99)
Potassium: 3.5 mEq/L (ref 3.5–5.1)
Sodium: 140 mEq/L (ref 135–145)
TOTAL PROTEIN: 6.8 g/dL (ref 6.0–8.3)

## 2015-05-17 LAB — CBC WITH DIFFERENTIAL/PLATELET
BASOS ABS: 0 10*3/uL (ref 0.0–0.1)
Basophils Relative: 0.7 % (ref 0.0–3.0)
EOS ABS: 0.1 10*3/uL (ref 0.0–0.7)
Eosinophils Relative: 1.3 % (ref 0.0–5.0)
HCT: 38.8 % (ref 36.0–46.0)
Hemoglobin: 12.9 g/dL (ref 12.0–15.0)
LYMPHS ABS: 2.6 10*3/uL (ref 0.7–4.0)
Lymphocytes Relative: 44.7 % (ref 12.0–46.0)
MCHC: 33.3 g/dL (ref 30.0–36.0)
MCV: 87.6 fl (ref 78.0–100.0)
MONO ABS: 0.5 10*3/uL (ref 0.1–1.0)
Monocytes Relative: 8.1 % (ref 3.0–12.0)
NEUTROS ABS: 2.6 10*3/uL (ref 1.4–7.7)
NEUTROS PCT: 45.2 % (ref 43.0–77.0)
PLATELETS: 255 10*3/uL (ref 150.0–400.0)
RBC: 4.43 Mil/uL (ref 3.87–5.11)
RDW: 14.7 % (ref 11.5–15.5)
WBC: 5.7 10*3/uL (ref 4.0–10.5)

## 2015-05-17 LAB — HIGH SENSITIVITY CRP: CRP HIGH SENSITIVITY: 0.36 mg/L (ref 0.000–5.000)

## 2015-05-17 LAB — SEDIMENTATION RATE: SED RATE: 16 mm/h (ref 0–22)

## 2015-05-17 NOTE — Progress Notes (Signed)
Review of pertinent gastrointestinal problems: 1. Crohn's ileitis: presented with diarrhea 2015, elevated sed rate; Colonoscopy 05/2014 [name] found completely normal colon but ulcerated terminal ileum; biopsies TI showed chronic inflammation, biopsies of right and left colon were normal. Started on prednisone 10 twice daily with good response. TPMT enzyme activity 05/2014 was normal. Azathiaprine 120m daily was started 06/2014; could not taper steroids below 166mper day, repeat CT scan showed distal 1/3 of ileum was thickened. She stopped azathioprine due to swelling in her legs, we asked her to continue however she adamantly declined.  02/2015 started biologics (humira) (01/2015 quant gold and Hep b testing negative)  HPI: This is a   very pleasant [age]ear old woman whom I last saw about 3 or 4 months ago just prior to initiation of Biologics.   Chief complaint is Crohn's disease  Started humira in September.  Currently getting 4030mumira every other week.  Comfortable with injections, deliveries working well.   She is still taking prednisone intermittently.  ER visits, due to pain, diarrhea.  She says these are flares.  I see one ER visit in early November.  She has taken prednisone 25m74mily for -3-4 days.   Past Medical History  Diagnosis Date  . IBS (irritable bowel syndrome)   . H/O varicella 09/10/11  . Low iron 09/10/11  . Yeast infection 09/10/11  . Bacterial infection 09/10/11  . Trichomonas 09/10/10  . Hypotension   . [facility]  Past Surgical History  Procedure Laterality Date  . Cesarean section  09/10/11    NRFHT - primary low transverse cesarean section   . Cesarean section N/A 09/04/2013    Procedure: CESAREAN SECTION- repeat;  Surgeon: UgonOsborne Oman;  Location: [facility];  Service: Obstetrics;  Laterality: N/A;    Current Outpatient Prescriptions  Medication Sig Dispense Refill  . acetaminophen (TYLENOL) 500 MG tablet Take 1 tablet (500 mg total) by  mouth every 6 (six) hours as needed. (Patient taking differently: Take 500 mg by mouth every 6 (six) hours as needed for mild pain. ) 30 tablet 0  . Adalimumab (HUMIRA PEN-CROHNS STARTER) 40 MG/0.8ML PNKT Inject 40 mg into the skin every 14 (fourteen) days. 2 each 6  . alum & mag hydroxide-simeth (MAALOX/MYLANTA) 200-200-20 MG/5ML suspension Take 30 mLs by mouth every 6 (six) hours as needed for indigestion or heartburn (indigestion).     . cetirizine (ZYRTEC) 10 MG tablet Take 10 mg by mouth at bedtime as needed for allergies (allergies).     . fluticasone (FLONASE) 50 MCG/ACT nasal spray Place 2 sprays into both nostrils daily. (Patient taking differently: Place 2 sprays into both nostrils daily as needed for allergies. ) 16 g 0  . IRON PO Take 1 tablet by mouth daily.    . ondansetron (ZOFRAN ODT) 8 MG disintegrating tablet Take 1 tablet (8 mg total) by mouth every 8 (eight) hours as needed for nausea or vomiting. 20 tablet 0  . ondansetron (ZOFRAN) 8 MG tablet Take 1 tablet (8 mg total) by mouth every 8 (eight) hours as needed for nausea or vomiting. 10 tablet 0  . pantoprazole (PROTONIX) 20 MG tablet Take 2 tablets (40 mg total) by mouth daily. 30 tablet 0  . predniSONE (DELTASONE) 10 MG tablet Take 2 tablets (20 mg total) by mouth 2 (two) times daily. 100 tablet 1  . predniSONE (DELTASONE) 20 MG tablet 3 tabs po daily x 2 days, 2 tabs po daily x 2 days,  1 tab po daily x 2 days 12 tablet 0  . Prenatal Vit-Fe Fumarate-FA (PRENATAL COMPLETE) 14-0.4 MG TABS Take 1 tablet by mouth daily. 30 each 11  . prenatal vitamin w/FE, FA (PRENATAL 1 + 1) 27-1 MG TABS tablet Take 1 tablet by mouth daily.   11  . saccharomyces boulardii (FLORASTOR) 250 MG capsule Take 250 mg by mouth 2 (two) times daily.    . sodium chloride (OCEAN) 0.65 % SOLN nasal spray Place 1 spray into both nostrils as needed for congestion. 88 mL 0  . sucralfate (CARAFATE) 1 G tablet Take 1 tablet (1 g total) by mouth 4 (four) times daily.  30 tablet 0   No current facility-administered medications for this visit.    Allergies as of 05/17/2015 - Review Complete 05/17/2015  Allergen Reaction Noted  . Lactose intolerance (gi) Diarrhea and Nausea And Vomiting 01/14/2013  . Flagyl [metronidazole] Itching and Rash 07/03/2013    Family History  Problem Relation Age of Onset  . Hypertension Mother   . Hypertension Father     Social History   Social History  . Marital Status: Single    Spouse Name: N/A  . Number of Children: 2  . Years of Education: N/A   Occupational History  . Homemaker    Social History Main Topics  . Smoking status: Former Smoker -- 0.10 packs/day for 8 years    Types: Cigarettes  . Smokeless tobacco: Never Used     Comment: e-cig  . Alcohol Use: No     Comment: occ  . Drug Use: Yes    Special: Marijuana     Comment: denies 02/03/14  . Sexual Activity: Yes    Birth Control/ Protection: None   Other Topics Concern  . Not on file   Social History Narrative     Physical Exam: BP 94/60 mmHg  Pulse 72  Ht 5' 1.25" (1.556 m)  Wt 186 lb (84.369 kg)  BMI 34.85 kg/m2  LMP 04/23/2015 (Approximate) Constitutional: generally well-appearing Psychiatric: alert and oriented x3 Abdomen: soft, nontender, nondistended, no obvious ascites, no peritoneal signs, normal bowel sounds   Assessment and plan: 24 y.o. female with Crohn's ileitis  She is clearly overall improved since initiation of Biologics clinically. She still has what she calls intermittent flares. I think this is happening 2 or 3 times. These episodes are lower abdominal cramping and some loose stools. She has self-administered low-dose prednisone for 2-3 days and so she gets better. I'm not sure these are actually flares of her Crohn's disease and I explained to her that I would like to be in on any decision of prior to taking steroids again. Indeed the goal of Humira is to keep her disease under control so that she does not need to  take steroids even intermittently. She'll get a basic set of labs including CBC, complete metabolic profile, inflammatory markers. She will return to see me in 2 months and sooner if needed. She will continue on Humira every other week until then.   Owens Loffler, MD [facility] 05/17/2015, 11:21 AM

## 2015-05-17 NOTE — Patient Instructions (Addendum)
You will have labs checked today in the basement lab.  Please head down after you check out with the front desk  (esr, CRP, cmet, cbc). Please return to see Dr. [name] in 2 months, sooner if needed. Your appointment is scheduled for 07/27/14 at 3:45pm. Call if you have problems prior to then. Stay on your humira, every other week.

## 2015-07-28 ENCOUNTER — Encounter: Payer: Self-pay | Admitting: Gastroenterology

## 2015-07-28 ENCOUNTER — Ambulatory Visit (INDEPENDENT_AMBULATORY_CARE_PROVIDER_SITE_OTHER): Payer: Medicaid Other | Admitting: Gastroenterology

## 2015-07-28 VITALS — BP 110/64 | HR 76 | Ht 61.25 in | Wt 185.5 lb

## 2015-07-28 DIAGNOSIS — K5090 Crohn's disease, unspecified, without complications: Secondary | ICD-10-CM | POA: Diagnosis not present

## 2015-07-28 NOTE — Progress Notes (Signed)
Review of pertinent gastrointestinal problems: 1. Crohn's ileitis: presented with diarrhea 2015, elevated sed rate; Colonoscopy 05/2014 [name] found completely normal colon but ulcerated terminal ileum; biopsies TI showed chronic inflammation, biopsies of right and left colon were normal. Started on prednisone 10 twice daily with good response. TPMT enzyme activity 05/2014 was normal. Azathiaprine 141m daily was started 06/2014; could not taper steroids below 185mper day, repeat CT scan showed distal 1/3 of ileum was thickened. She stopped azathioprine due to swelling in her legs, we asked her to continue however she adamantly declined. 02/2015 started biologics (humira) (01/2015 quant gold and Hep b testing negative).  05/2015 sed rate and CRP were normal.  HPI: This is a  very pleasant 25ear old woman with Crohn's ileitis  Chief complaint is Crohn's ileitis  humira going pretty well.  She started wheat grass pills 6 weeks ago.  Around that time her whole diet changed due to church fasting.  That fast has ended and she is back to her usual diet.  She felt overall better on the diet (cut out sodas, sweets, all meat).  REally at nuts, fruits, veggies, water, grains, pasta, fish.  She says her diarrhea has returned with her return of her usual diet.    Past Medical History  Diagnosis Date  . IBS (irritable bowel syndrome)   . H/O varicella 09/10/11  . Low iron 09/10/11  . Yeast infection 09/10/11  . Bacterial infection 09/10/11  . Trichomonas 09/10/10  . Hypotension   [facility]    Past Surgical History  Procedure Laterality Date  . Cesarean section  09/10/11    NRFHT - primary low transverse cesarean section   . Cesarean section N/A 09/04/2013    Procedure: CESAREAN SECTION- repeat;  Surgeon: UgOsborne OmanMD;  [facility];  Service: Obstetrics;  Laterality: N/A;    Current Outpatient Prescriptions  Medication Sig Dispense Refill  . acetaminophen (TYLENOL) 500 MG  tablet Take 1 tablet (500 mg total) by mouth every 6 (six) hours as needed. (Patient taking differently: Take 500 mg by mouth every 6 (six) hours as needed for mild pain. ) 30 tablet 0  . Adalimumab (HUMIRA PEN-CROHNS STARTER) 40 MG/0.8ML PNKT Inject 40 mg into the skin every 14 (fourteen) days. 2 each 6  . alum & mag hydroxide-simeth (MAALOX/MYLANTA) 200-200-20 MG/5ML suspension Take 30 mLs by mouth every 6 (six) hours as needed for indigestion or heartburn (indigestion).     . cetirizine (ZYRTEC) 10 MG tablet Take 10 mg by mouth at bedtime as needed for allergies (allergies).     . fluticasone (FLONASE) 50 MCG/ACT nasal spray Place 2 sprays into both nostrils daily. (Patient taking differently: Place 2 sprays into both nostrils daily as needed for allergies. ) 16 g 0  . IRON PO Take 1 tablet by mouth daily.    . ondansetron (ZOFRAN ODT) 8 MG disintegrating tablet Take 1 tablet (8 mg total) by mouth every 8 (eight) hours as needed for nausea or vomiting. 20 tablet 0  . pantoprazole (PROTONIX) 20 MG tablet Take 2 tablets (40 mg total) by mouth daily. 30 tablet 0  . Prenatal Vit-Fe Fumarate-FA (PRENATAL COMPLETE) 14-0.4 MG TABS Take 1 tablet by mouth daily. 30 each 11  . prenatal vitamin w/FE, FA (PRENATAL 1 + 1) 27-1 MG TABS tablet Take 1 tablet by mouth daily.   11  . saccharomyces boulardii (FLORASTOR) 250 MG capsule Take 250 mg by mouth 2 (two) times daily.    .Marland Kitchen  sodium chloride (OCEAN) 0.65 % SOLN nasal spray Place 1 spray into both nostrils as needed for congestion. 88 mL 0  . sucralfate (CARAFATE) 1 G tablet Take 1 tablet (1 g total) by mouth 4 (four) times daily. 30 tablet 0   No current facility-administered medications for this visit.    Allergies as of 07/28/2015 - Review Complete 07/28/2015  Allergen Reaction Noted  . Lactose intolerance (gi) Diarrhea and Nausea And Vomiting 01/14/2013  . Flagyl [metronidazole] Itching and Rash 07/03/2013    Family History  Problem Relation Age of  Onset  . Hypertension Mother   . Hypertension Father     Social History   Social History  . Marital Status: Single    Spouse Name: N/A  . Number of Children: 2  . Years of Education: N/A   Occupational History  . Homemaker    Social History Main Topics  . Smoking status: Former Smoker -- 0.10 packs/day for 8 years    Types: Cigarettes  . Smokeless tobacco: Never Used     Comment: e-cig  . Alcohol Use: No     Comment: occ  . Drug Use: Yes    Special: Marijuana     Comment: denies 02/03/14  . Sexual Activity: Yes    Birth Control/ Protection: None   Other Topics Concern  . Not on file   Social History Narrative     Physical Exam: BP 110/64 mmHg  Pulse 76  Ht 5' 1.25" (1.556 m)  Wt 185 lb 8 oz (84.142 kg)  BMI 34.75 kg/m2  LMP 07/01/2015  Breastfeeding? No Constitutional: generally well-appearing Psychiatric: alert and oriented x3 Abdomen: soft, nontender, nondistended, no obvious ascites, no peritoneal signs, normal bowel sounds   Assessment and plan: 25 y.o. female with Crohn's ileitis, on biologic monotherapy for 5 months now  Seems that her overall Crohn's symptoms are improving, diarrhea much less. She is very clear that some dietary changes she made temporarily also significantly improved her GI symptoms. I encouraged her to try to go back to that higher fruit, vegetable whole-grain diet. She knows to continue the Humira for her significant Crohn's ileitis she'll return to see me in 3 months and sooner if needed.   Owens Loffler, MD [facility] 07/28/2015, 3:49 PM

## 2015-07-28 NOTE — Patient Instructions (Addendum)
Continue the humira every other week. Please return to see Dr. [name] on 10/06/15 at 3:45 pm.

## 2015-08-20 ENCOUNTER — Emergency Department (HOSPITAL_COMMUNITY)
Admission: EM | Admit: 2015-08-20 | Discharge: 2015-08-20 | Disposition: A | Discharge status: Home / Self Care | Payer: Medicaid Other | Attending: Emergency Medicine | Admitting: Emergency Medicine

## 2015-08-20 ENCOUNTER — Encounter (HOSPITAL_COMMUNITY): Payer: Self-pay | Admitting: Emergency Medicine

## 2015-08-20 DIAGNOSIS — R110 Nausea: Secondary | ICD-10-CM | POA: Diagnosis not present

## 2015-08-20 DIAGNOSIS — K921 Melena: Secondary | ICD-10-CM | POA: Diagnosis not present

## 2015-08-20 DIAGNOSIS — R197 Diarrhea, unspecified: Secondary | ICD-10-CM | POA: Diagnosis not present

## 2015-08-20 DIAGNOSIS — Z3A08 8 weeks gestation of pregnancy: Secondary | ICD-10-CM | POA: Insufficient documentation

## 2015-08-20 DIAGNOSIS — Z87891 Personal history of nicotine dependence: Secondary | ICD-10-CM | POA: Insufficient documentation

## 2015-08-20 DIAGNOSIS — O9989 Other specified diseases and conditions complicating pregnancy, childbirth and the puerperium: Secondary | ICD-10-CM | POA: Insufficient documentation

## 2015-08-20 DIAGNOSIS — O10011 Pre-existing essential hypertension complicating pregnancy, first trimester: Secondary | ICD-10-CM | POA: Insufficient documentation

## 2015-08-20 DIAGNOSIS — Z79899 Other long term (current) drug therapy: Secondary | ICD-10-CM | POA: Diagnosis not present

## 2015-08-20 DIAGNOSIS — Z8679 Personal history of other diseases of the circulatory system: Secondary | ICD-10-CM | POA: Insufficient documentation

## 2015-08-20 DIAGNOSIS — Z7951 Long term (current) use of inhaled steroids: Secondary | ICD-10-CM | POA: Insufficient documentation

## 2015-08-20 DIAGNOSIS — Z8619 Personal history of other infectious and parasitic diseases: Secondary | ICD-10-CM | POA: Diagnosis not present

## 2015-08-20 DIAGNOSIS — O99611 Diseases of the digestive system complicating pregnancy, first trimester: Secondary | ICD-10-CM | POA: Insufficient documentation

## 2015-08-20 LAB — COMPREHENSIVE METABOLIC PANEL
ALT: 17 U/L (ref 14–54)
AST: 20 U/L (ref 15–41)
Albumin: 3.8 g/dL (ref 3.5–5.0)
Alkaline Phosphatase: 59 U/L (ref 38–126)
Anion gap: 10 (ref 5–15)
BUN: 11 mg/dL (ref 6–20)
CHLORIDE: 109 mmol/L (ref 101–111)
CO2: 21 mmol/L — AB (ref 22–32)
Calcium: 9.1 mg/dL (ref 8.9–10.3)
Creatinine, Ser: 0.82 mg/dL (ref 0.44–1.00)
GFR calc non Af Amer: >60 mL/min (ref >60)
Glucose, Bld: 83 mg/dL (ref 65–99)
Potassium: 3.5 mmol/L (ref 3.5–5.1)
SODIUM: 140 mmol/L (ref 135–145)
Total Bilirubin: 0.4 mg/dL (ref 0.3–1.2)
Total Protein: 7.6 g/dL (ref 6.5–8.1)

## 2015-08-20 LAB — CBC
HCT: 35.1 % — ABNORMAL LOW (ref 36.0–46.0)
HEMOGLOBIN: 12.4 g/dL (ref 12.0–15.0)
MCH: 29.9 pg (ref 26.0–34.0)
MCHC: 35.3 g/dL (ref 30.0–36.0)
MCV: 84.6 fL (ref 78.0–100.0)
Platelets: 302 10*3/uL (ref 150–400)
RBC: 4.15 MIL/uL (ref 3.87–5.11)
RDW: 12.4 % (ref 11.5–15.5)
WBC: 8.3 10*3/uL (ref 4.0–10.5)

## 2015-08-20 LAB — ABO/RH: ABO/RH(D): O POS

## 2015-08-20 LAB — HCG, QUANTITATIVE, PREGNANCY: HCG, BETA CHAIN, QUANT, S: 216916 m[IU]/mL — AB (ref <5)

## 2015-08-20 LAB — POC OCCULT BLOOD, ED: FECAL OCCULT BLD: POSITIVE — AB

## 2015-08-20 LAB — I-STAT BETA HCG BLOOD, ED (MC, WL, AP ONLY)

## 2015-08-20 MED ORDER — SODIUM CHLORIDE 0.9 % IV BOLUS (SEPSIS)
1000.0000 mL | Freq: Once | INTRAVENOUS | Status: AC
  Administered 2015-08-20: 1000 mL via INTRAVENOUS

## 2015-08-20 NOTE — ED Notes (Signed)
Patient IV # 20 taken out of the left ac.

## 2015-08-20 NOTE — Discharge Instructions (Signed)
Crohn Disease Crohn disease is a long-lasting (chronic) disease that affects your gastrointestinal (GI) tract. It often causes irritation and swelling (inflammation) in your small intestine and the beginning of your large intestine. However, it can affect any part of your GI tract. Crohn disease is part of a group of illnesses that are known as inflammatory bowel disease (IBD). Crohn disease may start slowly and get worse over time. Symptoms may come and go. They may also disappear for months or even years at a time (remission). CAUSES The exact cause of Crohn disease is not known. It may be a response that causes your body's defense system (immune system) to mistakenly attack healthy cells and tissues (autoimmune response). Your genes and your environment may also play a role. RISK FACTORS You may be at greater risk for Crohn disease if you:  Have other family members with Crohn disease or another IBD.  Use any tobacco products, including cigarettes, chewing tobacco, or electronic cigarettes.  Are in your 62s.  Have [place] European ancestry. SIGNS AND SYMPTOMS The main signs and symptoms of Crohn disease involve your GI tract. These include:  Diarrhea.  Rectal bleeding.  An urgent need to move your bowels.  The feeling that you are not finished having a bowel movement.  Abdominal pain or cramping.  Constipation. General signs and symptoms of Crohn disease may also include:  Unexplained weight loss.  Fatigue.  Fever.  Nausea.  Loss of appetite.  Joint pain  Changes in vision.  Red bumps on your skin. DIAGNOSIS Your health care provider may suspect Crohn disease based on your symptoms and your medical history. Your health care provider will do a physical exam. You may need to see a health care provider who specializes in diseases of the digestive tract (gastroenterologist). You may also have tests to help your health care providers make a diagnosis. These may  include:  Blood tests.  Stool sample tests.  Imaging tests, such as X-rays and CT scans.  Tests to examine the inside of your intestines using a long, flexible tube that has a light and a camera on the end (endoscopy or colonoscopy).  A procedure to take tissue samples from inside your bowel (biopsy) to be examined under a microscope. TREATMENT  There is no cure for Crohn disease. Treatment will focus on managing your symptoms. Crohn disease affects each person differently. Your treatment may include:  Resting your bowels. Drinking only clear liquids or getting nutrition through an IV for a period of time gives your bowels a chance to heal because they are not passing stools.  Medicines. These may be used alone or in combination (combination therapy). These may include antibiotic medicines. You may be given medicines that help to:  Reduce inflammation.  Control your immune system activity.  Fight infections.  Relieve cramps and prevent diarrhea.  Control your pain.  Surgery. You may need surgery if:  Medicines and other treatments are no longer working.  You develop complications from severe Crohn disease.  A section of your intestine becomes so damaged that it needs to be removed. HOME CARE INSTRUCTIONS  Take medicines only as directed by your health care provider.  If you were prescribed an antibiotic medicine, finish it all even if you start to feel better.  Keep all follow-up visits as directed by your health care provider. This is important.  Talk with your health care provider about changing your diet. This may help your symptoms. Your health care provide may recommend changes, such  as:  Drinking more fluids.  Avoiding milk and other foods that contain lactose.  Eating a low-fat diet.  Avoiding high-fiber foods, such as popcorn and nuts.  Avoiding carbonated beverages, such as soda.  Eating smaller meals more often rather than eating large  meals.  Keeping a food diary to identify foods that make your symptoms better or worse.  Do not use any tobacco products, including cigarettes, chewing tobacco, or electronic cigarettes. If you need help quitting, ask your health care provider.  Limit alcohol intake to no more than 1 drink per day for nonpregnant women and 2 drinks per day for men. One drink equals 12 ounces of beer, 5 ounces of wine, or 1 ounces of hard liquor.  Exercise daily or as directed by your health care provider. SEEK MEDICAL CARE IF:  You have diarrhea, abdominal cramps, and other gastrointestinal problems that are present almost all of the time.  Your symptoms do not improve with treatment.  You continue to lose weight.  You develop a rash or sores on your skin.  You develop eye problems.  You have a fever.   Your symptoms get worse.  You develop new symptoms. SEEK IMMEDIATE MEDICAL CARE IF:  You have bloody diarrhea.  You develop severe abdominal pain.  You cannot pass stools.   This information is not intended to replace advice given to you by your health care provider. Make sure you discuss any questions you have with your health care provider.   Document Released: 03/06/2005 Document Revised: 06/17/2014 Document Reviewed: 01/12/2014 Elsevier Interactive Patient Education [employer].

## 2015-08-20 NOTE — ED Notes (Signed)
Per pt, states history of Chron's disease-has a hard stool yesterday with small amount of blood-states had diarrhea with large amount of blood today

## 2015-08-20 NOTE — ED Provider Notes (Signed)
CSN: [id number]     Arrival date & time 08/20/15  1806 History   First MD Initiated Contact with Patient 08/20/15 1842     Chief Complaint  Patient presents with  . Blood In Stools    Sharon Clayton is a 25 y.o. female who is currently pregnant with a history of crohn's disease who presents to the ED complaining of blood in her stool since yesterday. She reports she feels like she is having a crohn's flare. She reports yesterday she had a hard stool with bright red blood streaked in her stool. She reports today she had some cramping abdominal pain intermittently since today. She reports prior to arrival she had 1 large watery stool that was full of bright red blood. She denies current abdominal pain. She does complain of some pain at her rectum. Patient reports missing her most recent dose of Humira 2 days ago. Patient is [redacted] weeks pregnant. Patient denies fevers, vomiting, vaginal bleeding, vaginal discharge, urinary symptoms, lightheadedness, dizziness, chest pain, shortness of breath or rashes.  The history is provided by the patient. No language interpreter was used.    Past Medical History  Diagnosis Date  . IBS (irritable bowel syndrome)   . H/O varicella 09/10/11  . Low iron 09/10/11  . Yeast infection 09/10/11  . Bacterial infection 09/10/11  . Trichomonas 09/10/10  . Hypotension   . Crohn's disease [facility] [place])    Past Surgical History  Procedure Laterality Date  . Cesarean section  09/10/11    NRFHT - primary low transverse cesarean section   . Cesarean section N/A 09/04/2013    Procedure: CESAREAN SECTION- repeat;  Surgeon: Osborne Oman, MD;  Location: [facility];  Service: Obstetrics;  Laterality: N/A;   Family History  Problem Relation Age of Onset  . Hypertension Mother   . Hypertension Father    Social History  Substance Use Topics  . Smoking status: Former Smoker -- 0.10 packs/day for 8 years    Types: Cigarettes  . Smokeless tobacco: Never Used     Comment: e-cig  . Alcohol Use:  No     Comment: occ   OB History    Gravida Para Term Preterm AB TAB SAB Ectopic Multiple Living   4 2 2  0 1 0 1 0 0 2     Review of Systems  Constitutional: Negative for fever and chills.  HENT: Negative for congestion and sore throat.   Eyes: Negative for visual disturbance.  Respiratory: Negative for cough, shortness of breath and wheezing.   Cardiovascular: Negative for chest pain and palpitations.  Gastrointestinal: Positive for nausea, abdominal pain, diarrhea and blood in stool. Negative for vomiting.  Genitourinary: Negative for dysuria, frequency, hematuria and difficulty urinating.  Musculoskeletal: Negative for back pain and neck pain.  Skin: Negative for rash.  Neurological: Negative for weakness and headaches.      Allergies  Lactose intolerance (gi) and Flagyl  Home Medications   Prior to Admission medications   Medication Sig Start Date End Date Taking? Authorizing Provider  Adalimumab (HUMIRA PEN-CROHNS STARTER) 40 MG/0.8ML PNKT Inject 40 mg into the skin every 14 (fourteen) days. 01/16/15  Yes Milus Banister, MD  alum & mag hydroxide-simeth (MAALOX/MYLANTA) 200-200-20 MG/5ML suspension Take 30 mLs by mouth every 6 (six) hours as needed for indigestion or heartburn (indigestion).    Yes Historical Provider, MD  cetirizine (ZYRTEC) 10 MG tablet Take 10 mg by mouth at bedtime as needed for allergies (allergies).  Yes Historical Provider, MD  fluticasone (FLONASE) 50 MCG/ACT nasal spray Place 2 sprays into both nostrils daily. Patient taking differently: Place 2 sprays into both nostrils daily as needed for allergies.  10/12/14  Yes Robyn M Hess, PA-C  IRON PO Take 1 tablet by mouth daily.   Yes Historical Provider, MD  ondansetron (ZOFRAN ODT) 8 MG disintegrating tablet Take 1 tablet (8 mg total) by mouth every 8 (eight) hours as needed for nausea or vomiting. 01/29/15  Yes Linton Flemings, MD  prenatal vitamin w/FE, FA (PRENATAL 1 + 1) 27-1 MG TABS tablet Take 1 tablet by  mouth daily.  08/18/14  Yes Historical Provider, MD  saccharomyces boulardii (FLORASTOR) 250 MG capsule Take 250 mg by mouth 2 (two) times daily.   Yes Historical Provider, MD  sodium chloride (OCEAN) 0.65 % SOLN nasal spray Place 1 spray into both nostrils as needed for congestion. 10/12/14  Yes Robyn M Hess, PA-C   BP 121/74 mmHg  Pulse 86  Temp(Src) 98.8 F (37.1 C) (Oral)  Resp 18  SpO2 100%  LMP 06/19/2015 Physical Exam  Constitutional: She appears well-developed and well-nourished. No distress.  Nontoxic appearing.  HENT:  Head: Normocephalic and atraumatic.  Mouth/Throat: Oropharynx is clear and moist.  Eyes: Conjunctivae are normal. Pupils are equal, round, and reactive to light. Right eye exhibits no discharge. Left eye exhibits no discharge.  Neck: Neck supple.  Cardiovascular: Normal rate, regular rhythm, normal heart sounds and intact distal pulses.  Exam reveals no gallop and no friction rub.   No murmur heard. Pulmonary/Chest: Effort normal and breath sounds normal. No respiratory distress. She has no wheezes. She has no rales.  Abdominal: Soft. Bowel sounds are normal. She exhibits no distension and no mass. There is no tenderness. There is no rebound and no guarding.  Abdomen is soft and nontender to palpation. Bowel sounds are present. No CVA or flank tenderness.  Genitourinary: Guaiac positive stool.  Musculoskeletal: She exhibits no edema.  Lymphadenopathy:    She has no cervical adenopathy.  Neurological: She is alert. Coordination normal.  Skin: Skin is warm and dry. No rash noted. She is not diaphoretic. No erythema. No pallor.  Psychiatric: She has a normal mood and affect. Her behavior is normal.  Nursing note and vitals reviewed.   ED Course  Procedures (including critical care time) Labs Review Labs Reviewed  COMPREHENSIVE METABOLIC PANEL - Abnormal; Notable for the following:    CO2 21 (*)    All other components within normal limits  CBC - Abnormal;  Notable for the following:    HCT 35.1 (*)    All other components within normal limits  HCG, QUANTITATIVE, PREGNANCY - Abnormal; Notable for the following:    hCG, Beta Chain, Laqueta Carina [id number] (*)    All other components within normal limits  POC OCCULT BLOOD, ED - Abnormal; Notable for the following:    Fecal Occult Bld POSITIVE (*)    All other components within normal limits  I-STAT BETA HCG BLOOD, ED ([facility], [facility], [facility] ONLY) - Abnormal; Notable for the following:    I-stat hCG, quantitative >2000.0 (*)    All other components within normal limits  TYPE AND SCREEN  ABO/RH    Imaging Review No results found. I have personally reviewed and evaluated these lab results as part of my medical decision-making.   EKG Interpretation None     Filed Vitals:   08/20/15 1814 08/20/15 2001  BP: 121/74   Pulse: 86  Temp: 98.9 F (37.2 C) 98.8 F (37.1 C)  TempSrc: Oral Oral  Resp: 18   SpO2: 100%     MDM   Meds given in ED:  Medications  sodium chloride 0.9 % bolus 1,000 mL (1,000 mLs Intravenous New Bag/Given 08/20/15 2032)    New Prescriptions   No medications on file    Final diagnoses:  Blood in stool   This  is a 25 y.o. female who is currently pregnant with a history of crohn's disease who presents to the ED complaining of blood in her stool since yesterday. She reports she feels like she is having a crohn's flare. She reports yesterday she had a hard stool with bright red blood streaked in her stool. She reports today she had some cramping abdominal pain intermittently since today. She reports prior to arrival she had 1 large watery stool that was full of bright red blood. She denies current abdominal pain. She does complain of some pain at her rectum. Patient reports missing her most recent dose of Humira 2 days ago. Patient is [redacted] weeks pregnant.  On exam the patient is afebrile nontoxic appearing. Her abdomen is soft and nontender to palpation. Bowel sounds are present. No CVA  or flank tenderness. She denies feeling lightheaded or short of breath.  The patient has a positive Hemoccult test. CMP is unremarkable. CBC shows a hemoglobin of 12.4 with hematocrit of 35.1. Quantitative hCG is 216,916. Patient is knowingly [redacted] weeks pregnant.  I have low suspicion for ectopic as she has no vaginal bleeding and no abdominal tenderness.  As the patient is pregnant, I consulted with Dr. Henrene Pastor from GI about further treatment.  We both feel the patient is stable and he suggests that we have her follow up in office tomorrow with her GI Dr. [name] to discuss further about treatment options.  I discussed this with the patient and she agrees with this plan. I discussed strict and specific return precautions with the patient. I advised the patient to follow-up with their primary care provider this week. I advised the patient to return to the emergency department with new or worsening symptoms or new concerns. The patient verbalized understanding and agreement with plan.    This patient was discussed with Dr. Johnney Killian who agrees with assessment and plan.   Waynetta Pean, PA-C 08/20/15 2200  Charlesetta Shanks, MD 08/23/15 1538

## 2015-08-21 ENCOUNTER — Telehealth: Payer: Self-pay | Admitting: Gastroenterology

## 2015-08-21 LAB — TYPE AND SCREEN
ABO/RH(D): O POS
Antibody Screen: NEGATIVE

## 2015-08-21 NOTE — Telephone Encounter (Signed)
Left message on machine to call back  

## 2015-08-22 NOTE — Telephone Encounter (Signed)
Dr [name] please review the ED note from 08/20/15, the pt is pregnant and has had rectal bleeding and was told to follow up with you this week.  She has an appt on 10/06/15.  I spoke with her today and she has NO bleeding or other symptoms.  Is this appt soon enough?

## 2015-08-22 NOTE — Telephone Encounter (Signed)
The pt has been scheduled for 09/01/15 930 am as per Dr [name], she was notified to continue humira.

## 2015-08-22 NOTE — Telephone Encounter (Signed)
I prefer sooner.  OK to double book her sometime next week with me.    She needs to stay ON her humira, it is OK that she stays on it during pregnancy.  It is very important that we keep her crohn's under control while she is pregnant.

## 2015-09-01 ENCOUNTER — Ambulatory Visit (INDEPENDENT_AMBULATORY_CARE_PROVIDER_SITE_OTHER): Payer: Medicaid Other | Admitting: Gastroenterology

## 2015-09-01 ENCOUNTER — Encounter: Payer: Self-pay | Admitting: Gastroenterology

## 2015-09-01 VITALS — BP 110/70 | HR 68 | Ht 61.5 in | Wt 187.0 lb

## 2015-09-01 DIAGNOSIS — K5090 Crohn's disease, unspecified, without complications: Secondary | ICD-10-CM | POA: Diagnosis not present

## 2015-09-01 NOTE — Progress Notes (Signed)
Review of pertinent gastrointestinal problems: 1. Crohn's ileitis: presented with diarrhea 2015, elevated sed rate; Colonoscopy 05/2014 [name] found completely normal colon but ulcerated terminal ileum; biopsies TI showed chronic inflammation, biopsies of right and left colon were normal. Started on prednisone 10 twice daily with good response. TPMT enzyme activity 05/2014 was normal. Azathiaprine 128m daily was started 06/2014; could not taper steroids below 180mper day, repeat CT scan showed distal 1/3 of ileum was thickened. She stopped azathioprine due to swelling in her legs, we asked her to continue however she adamantly declined. 02/2015 started biologics (humira) (01/2015 quant gold and Hep b testing negative). 05/2015 sed rate and CRP were normal. 08/2015 Pregnant, this was unplanned and she terminated the pregnancy.  HPI: This is a very pleasant [age]ear old woman  Chief complaint is Crohn's ileitis  She missed her humira dose 2 weeks ago.  She lost her card.  In the past week or so she has had slightly increased diarrhea, abdominal pains.  She had a hard BM and had some minor bleeding.  Went to ER, Hb was normal.    She now has the humira, was delivered yesterday.  She had an abortion this past weekend.   Past Medical History  Diagnosis Date  . IBS (irritable bowel syndrome)   . H/O varicella 09/10/11  . Low iron 09/10/11  . Yeast infection 09/10/11  . Bacterial infection 09/10/11  . Trichomonas 09/10/10  . Hypotension   [facility]    Past Surgical History  Procedure Laterality Date  . Cesarean section  09/10/11    NRFHT - primary low transverse cesarean section   . Cesarean section N/A 09/04/2013    Procedure: CESAREAN SECTION- repeat;  Surgeon: UgOsborne OmanMD;  [facility];  Service: Obstetrics;  Laterality: N/A;    Current Outpatient Prescriptions  Medication Sig Dispense Refill  . Adalimumab (HUMIRA PEN-CROHNS STARTER) 40 MG/0.8ML PNKT Inject 40 mg  into the skin every 14 (fourteen) days. 2 each 6  . alum & mag hydroxide-simeth (MAALOX/MYLANTA) 200-200-20 MG/5ML suspension Take 30 mLs by mouth every 6 (six) hours as needed for indigestion or heartburn (indigestion).     . cetirizine (ZYRTEC) 10 MG tablet Take 10 mg by mouth at bedtime as needed for allergies (allergies).     . fluticasone (FLONASE) 50 MCG/ACT nasal spray Place 2 sprays into both nostrils daily. (Patient taking differently: Place 2 sprays into both nostrils daily as needed for allergies. ) 16 g 0  . IRON PO Take 1 tablet by mouth daily.    . ondansetron (ZOFRAN ODT) 8 MG disintegrating tablet Take 1 tablet (8 mg total) by mouth every 8 (eight) hours as needed for nausea or vomiting. 20 tablet 0  . prenatal vitamin w/FE, FA (PRENATAL 1 + 1) 27-1 MG TABS tablet Take 1 tablet by mouth daily.   11  . saccharomyces boulardii (FLORASTOR) 250 MG capsule Take 250 mg by mouth 2 (two) times daily.    . sodium chloride (OCEAN) 0.65 % SOLN nasal spray Place 1 spray into both nostrils as needed for congestion. 88 mL 0   No current facility-administered medications for this visit.    Allergies as of 09/01/2015 - Review Complete 09/01/2015  Allergen Reaction Noted  . Lactose intolerance (gi) Diarrhea and Nausea And Vomiting 01/14/2013  . Flagyl [metronidazole] Itching and Rash 07/03/2013    Family History  Problem Relation Age of Onset  . Hypertension Mother   . Hypertension Father  Social History   Social History  . Marital Status: Single    Spouse Name: N/A  . Number of Children: 2  . Years of Education: N/A   Occupational History  . Homemaker    Social History Main Topics  . Smoking status: Former Smoker -- 0.10 packs/day for 8 years    Types: Cigarettes  . Smokeless tobacco: Never Used     Comment: e-cig  . Alcohol Use: No     Comment: occ  . Drug Use: Yes    Special: Marijuana     Comment: denies 02/03/14  . Sexual Activity: Yes    Birth Control/  Protection: None   Other Topics Concern  . Not on file   Social History Narrative     Physical Exam: BP 110/70 mmHg  Pulse 68  Ht 5' 1.5" (1.562 m)  Wt 187 lb (84.823 kg)  BMI 34.77 kg/m2  LMP 06/19/2015 Constitutional: generally well-appearing Psychiatric: alert and oriented x3 Abdomen: soft, nontender, nondistended, no obvious ascites, no peritoneal signs, normal bowel sounds   Assessment and plan: 25 y.o. female with Crohn's ileitis  She missed a Humira dose 2 weeks ago for unclear reasons however she says she does not think it will ever happen again. She is going to resume her usual every 2 week Humira dosing today.  She will return to see me in 2-3 months and sooner if needed.   Owens Loffler, MD [facility] 09/01/2015, 9:52 AM

## 2015-09-01 NOTE — Patient Instructions (Signed)
You need to resume your humira today and continue it every 2 weeks. Please return to see Dr. [name] in 2-3 months, sooner if needed.

## 2015-10-06 ENCOUNTER — Ambulatory Visit: Payer: Self-pay | Admitting: Gastroenterology

## 2015-10-26 ENCOUNTER — Other Ambulatory Visit: Payer: Self-pay | Admitting: Gastroenterology

## 2015-11-14 ENCOUNTER — Encounter (HOSPITAL_COMMUNITY): Payer: Self-pay | Admitting: Emergency Medicine

## 2015-11-14 ENCOUNTER — Emergency Department (HOSPITAL_COMMUNITY)
Admission: EM | Admit: 2015-11-14 | Discharge: 2015-11-14 | Disposition: A | Discharge status: Home / Self Care | Payer: Medicaid Other | Attending: Emergency Medicine | Admitting: Emergency Medicine

## 2015-11-14 ENCOUNTER — Emergency Department (HOSPITAL_COMMUNITY): Payer: Medicaid Other

## 2015-11-14 DIAGNOSIS — F17210 Nicotine dependence, cigarettes, uncomplicated: Secondary | ICD-10-CM | POA: Diagnosis not present

## 2015-11-14 DIAGNOSIS — R0789 Other chest pain: Secondary | ICD-10-CM | POA: Insufficient documentation

## 2015-11-14 DIAGNOSIS — Z791 Long term (current) use of non-steroidal anti-inflammatories (NSAID): Secondary | ICD-10-CM | POA: Insufficient documentation

## 2015-11-14 DIAGNOSIS — Z79899 Other long term (current) drug therapy: Secondary | ICD-10-CM | POA: Insufficient documentation

## 2015-11-14 DIAGNOSIS — R079 Chest pain, unspecified: Secondary | ICD-10-CM

## 2015-11-14 LAB — CBC
HEMATOCRIT: 37.1 % (ref 36.0–46.0)
Hemoglobin: 13.3 g/dL (ref 12.0–15.0)
MCH: 30 pg (ref 26.0–34.0)
MCHC: 35.8 g/dL (ref 30.0–36.0)
MCV: 83.6 fL (ref 78.0–100.0)
PLATELETS: 319 10*3/uL (ref 150–400)
RBC: 4.44 MIL/uL (ref 3.87–5.11)
RDW: 12.9 % (ref 11.5–15.5)
WBC: 6.5 10*3/uL (ref 4.0–10.5)

## 2015-11-14 LAB — BASIC METABOLIC PANEL
Anion gap: 8 (ref 5–15)
BUN: 6 mg/dL (ref 6–20)
CHLORIDE: 109 mmol/L (ref 101–111)
CO2: 23 mmol/L (ref 22–32)
CREATININE: 0.89 mg/dL (ref 0.44–1.00)
Calcium: 10 mg/dL (ref 8.9–10.3)
GFR calc Af Amer: >60 mL/min (ref >60)
GFR calc non Af Amer: >60 mL/min (ref >60)
GLUCOSE: 91 mg/dL (ref 65–99)
POTASSIUM: 3.3 mmol/L — AB (ref 3.5–5.1)
Sodium: 140 mmol/L (ref 135–145)

## 2015-11-14 LAB — I-STAT TROPONIN, ED: Troponin i, poc: 0.01 ng/mL (ref 0.00–0.08)

## 2015-11-14 LAB — I-STAT BETA HCG BLOOD, ED (MC, WL, AP ONLY): I-stat hCG, quantitative: <5 m[IU]/mL (ref <5)

## 2015-11-14 LAB — D-DIMER, QUANTITATIVE: D-Dimer, Quant: <0.27 ug/mL-FEU (ref 0.00–0.50)

## 2015-11-14 MED ORDER — ONDANSETRON HCL 4 MG/2ML IJ SOLN
4.0000 mg | Freq: Once | INTRAMUSCULAR | Status: AC
  Administered 2015-11-14: 4 mg via INTRAVENOUS
  Filled 2015-11-14: qty 2

## 2015-11-14 MED ORDER — KETOROLAC TROMETHAMINE 30 MG/ML IJ SOLN
30.0000 mg | Freq: Once | INTRAMUSCULAR | Status: AC
  Administered 2015-11-14: 30 mg via INTRAVENOUS
  Filled 2015-11-14: qty 1

## 2015-11-14 MED ORDER — PRENATAL PLUS 27-1 MG PO TABS: 1.0000 | ORAL_TABLET | Freq: Every day | ORAL | Status: DC

## 2015-11-14 MED ORDER — SODIUM CHLORIDE 0.9 % IV BOLUS (SEPSIS)
1000.0000 mL | Freq: Once | INTRAVENOUS | Status: AC
  Administered 2015-11-14: 1000 mL via INTRAVENOUS

## 2015-11-14 NOTE — ED Provider Notes (Signed)
CSN: [id number]     Arrival date & time 11/14/15  1340 History   First MD Initiated Contact with Patient 11/14/15 1505     Chief Complaint  Patient presents with  . Chest Pain     (Consider location/radiation/quality/duration/timing/severity/associated sxs/prior Treatment) HPI Comments: Sharon Clayton is a 25 y.o. female with history of crohn's and anxiety presents to ED for chest pain. Patient states pain started this afternoon while she was sitting. Pain is left sided and was initially constant in nature. Now pain is intermittent. It is 8/10 and described as a burning sensation that is not worse with movement or deep breathing. She initially had some radiation into left neck and arm; however, that has since resolved. Associated symptoms include nausea, vomiting, and palpitations that has since resolved. She endorses positional lightheadedness; however, she recently had a crohn's flare up and reports a significant amount of diarrhea - she is working on increasing fluid intake. No fever, chills, or night sweats. She denies leg swelling or pain. No recent long distance travel/surgery/immobilization, no h/o blood clots, no h/o cancer or cancer treatment, no exogenous estrogen use. Denies trauma or changes in activity. States she had a heart murmur as a child; otherwise, no cardiac history. No family cardiac history.   Patient is a 25 y.o. female presenting with chest pain. The history is provided by the patient and medical records.  Chest Pain Associated symptoms: nausea, palpitations ( resolved), shortness of breath ( when feeling anxious) and vomiting (no hematemesis)   Associated symptoms: no abdominal pain, no cough, no diaphoresis, no dysphagia, no fatigue, no fever, no headache, no numbness and no weakness     Past Medical History  Diagnosis Date  . IBS (irritable bowel syndrome)   . H/O varicella 09/10/11  . Low iron 09/10/11  . Yeast infection 09/10/11  . Bacterial infection 09/10/11  .  Trichomonas 09/10/10  . Hypotension   . Crohn's disease [facility])    Past Surgical History  Procedure Laterality Date  . Cesarean section  09/10/11    NRFHT - primary low transverse cesarean section   . Cesarean section N/A 09/04/2013    Procedure: CESAREAN SECTION- repeat;  Surgeon: Osborne Oman, MD;  Location: [place] [facility];  Service: Obstetrics;  Laterality: N/A;   Family History  Problem Relation Age of Onset  . Hypertension Mother   . Hypertension Father    Social History  Substance Use Topics  . Smoking status: Current Every Day Smoker -- 0.10 packs/day for 8 years    Types: Cigarettes  . Smokeless tobacco: Never Used     Comment: e-cig  . Alcohol Use: No     Comment: occ   OB History    Gravida Para Term Preterm AB TAB SAB Ectopic Multiple Living   4 2 2  0 1 0 1 0 0 2     Review of Systems  Constitutional: Negative for fever, chills, diaphoresis and fatigue.  HENT: Negative for sore throat and trouble swallowing.   Eyes: Negative for visual disturbance.  Respiratory: Positive for shortness of breath ( when feeling anxious). Negative for cough.   Cardiovascular: Positive for chest pain and palpitations ( resolved). Negative for leg swelling.  Gastrointestinal: Positive for nausea and vomiting (no hematemesis). Negative for abdominal pain, diarrhea, constipation and blood in stool.  Genitourinary: Negative for dysuria and hematuria.  Musculoskeletal: Negative for neck pain and neck stiffness.  Neurological: Positive for light-headedness. Negative for weakness, numbness and headaches.  Psychiatric/Behavioral: The patient  is nervous/anxious.       Allergies  Lactose intolerance (gi) and Flagyl  Home Medications   Prior to Admission medications   Medication Sig Start Date End Date Taking? Authorizing Provider  alum & mag hydroxide-simeth (MAALOX/MYLANTA) 200-200-20 MG/5ML suspension Take 30 mLs by mouth every 6 (six) hours as needed for indigestion or heartburn  (indigestion).    Yes Historical Provider, MD  aluminum-magnesium hydroxide-simethicone (MAALOX) 301-601-09 MG/5ML SUSP Take 30 mLs by mouth daily as needed (for indigestion).   Yes Historical Provider, MD  cetirizine (ZYRTEC) 10 MG tablet Take 10 mg by mouth at bedtime as needed for allergies.    Yes Historical Provider, MD  DIETARY MANAGEMENT PRODUCT PO Take 1 tablet by mouth every other day. *Wheat Grass*   Yes Historical Provider, MD  Diphenhydramine-PE-APAP (THERAFLU EXPRESSMAX) 12.5-5-325 MG/15ML LIQD Take 30 mLs by mouth once.   Yes Historical Provider, MD  fluticasone (FLONASE) 50 MCG/ACT nasal spray Place 2 sprays into both nostrils daily. Patient taking differently: Place 2 sprays into both nostrils daily as needed for allergies.  10/12/14  Yes Robyn M Hess, PA-C  HUMIRA PEN 40 MG/0.8ML PNKT INJECT 40MG SUBCUTANEOUSLY EVERY OTHER WEEK Patient taking differently: INJECT 40MG SUBCUTANEOUSLY EVERY OTHER WEEK on Friday's 10/26/15  Yes Milus Banister, MD  HYDROcodone-acetaminophen (NORCO/VICODIN) 5-325 MG tablet Take 0.5-1 tablets by mouth every 6 (six) hours as needed for moderate pain.   Yes Historical Provider, MD  ibuprofen (ADVIL,MOTRIN) 200 MG tablet Take 200 mg by mouth once.   Yes Historical Provider, MD  ibuprofen (ADVIL,MOTRIN) 800 MG tablet Take 800 mg by mouth every 8 (eight) hours as needed for moderate pain.   Yes Historical Provider, MD  prenatal vitamin w/FE, FA (PRENATAL 1 + 1) 27-1 MG TABS tablet Take 1 tablet by mouth daily.  08/18/14  Yes Historical Provider, MD   BP 120/76 mmHg  Pulse 53  Temp(Src) 98.8 F (37.1 C) (Oral)  Resp 16  SpO2 100%  LMP 10/31/2015 (Exact Date)  Breastfeeding? Unknown Physical Exam  Constitutional: She appears well-developed and well-nourished.  Anxious appearing  HENT:  Head: Normocephalic and atraumatic.  Mouth/Throat: Oropharynx is clear and moist. No oropharyngeal exudate.  Eyes: Conjunctivae and EOM are normal. Pupils are equal, round,  and reactive to light. Right eye exhibits no discharge. Left eye exhibits no discharge. No scleral icterus.  Neck: Normal range of motion. Neck supple.  Cardiovascular: Normal rate, regular rhythm, normal heart sounds and intact distal pulses.   No murmur heard. Pulmonary/Chest: Effort normal and breath sounds normal. No respiratory distress.  Abdominal: Soft. Bowel sounds are normal. There is no tenderness. There is no rebound and no guarding.  Musculoskeletal: Normal range of motion. She exhibits no edema or tenderness.  Lymphadenopathy:    She has no cervical adenopathy.  Neurological: She is alert. She has normal strength. No cranial nerve deficit or sensory deficit. Coordination normal.  Skin: Skin is warm and dry. She is not diaphoretic.  Psychiatric: She has a normal mood and affect. Her behavior is normal.    ED Course  Procedures (including critical care time) Labs Review Labs Reviewed  BASIC METABOLIC PANEL - Abnormal; Notable for the following:    Potassium 3.3 (*)    All other components within normal limits  CBC  D-DIMER, QUANTITATIVE (NOT AT [facility])  I-STAT TROPOININ, ED  I-STAT BETA HCG BLOOD, ED ([facility], [facility], [facility] ONLY)    Imaging Review Dg Chest 2 View  11/14/2015  CLINICAL DATA:  Chest  pain for 1 day EXAM: CHEST  2 VIEW COMPARISON:  None. FINDINGS: Lungs are clear. Heart size and pulmonary vascularity are normal. No adenopathy. No pneumothorax. No bone lesions. IMPRESSION: No edema or consolidation. Electronically Signed   By: Lowella Grip III M.D.   On: 11/14/2015 14:48   I have personally reviewed and evaluated these images and lab results as part of my medical decision-making.   EKG Interpretation   Date/Time:  Tuesday November 14 2015 13:54:26 EDT Ventricular Rate:  98 PR Interval:  168 QRS Duration: 83 QT Interval:  345 QTC Calculation: 440 R Axis:   75 Text Interpretation:  Sinus rhythm Probable left atrial enlargement RSR'  in V1 or V2, probably normal  variant Since last tracing rate faster  Confirmed by [facility]  MD-J, JON ([id number]) on 11/14/2015 1:58:10 PM Also  confirmed by [facility]  MD-J, JON ([id number]), editor Rolla Plate, Joelene Millin [phone])  on  11/14/2015 2:58:52 PM      MDM   Final diagnoses:  Chest pain, unspecified chest pain type   Patient is afebrile and nontoxic. Vital signs are stable. She is anxious appearing. Physical exam re-assuring, mild TTP of left chest wall. CBC is re-assuring. CMP shows mildly low potassium, will encourage potassium rich food intake. EKG shows NSR with slightly faster tracing. Troponin normal. Heart score 1. CXR negative for consolidation, edema, or pneumothorax. Well's score 0, PERC score 0, low suspicion for PE.  Will check orthostatics for volume status. Provide fluids, zofran, and toradol for pain relief.  Patient orthostatic, may be contributing to positional lightheadedness. Patient voiced concern for possible blood clots given history of father dying of massive PE. D-dimer negative.  6:28 PM: On reevaluation patient is resting comfortably. Patient reports complete resolution of chest pain.   ?acid reflux vs. ?MSK vs. ?Anxiety. Discussed results with patient. Encouraged follow up with PCP in the next week for re-evaluation. Discussed return precautions. Patient voiced understanding and is agreeable.   Roxanna Mew, PA-C 11/14/15 1843  Harvel Quale, MD 11/20/15 [phone]
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
## 2015-11-14 NOTE — Discharge Instructions (Signed)
Read the information below.   Your troponin was negative. Your chest x-ray was negative for pneumonia, effusion, or pneumothorax. Your d-dimer was negative.  You were showing to be a little volume depleted. Be sure to drink plenty of fluids.  Be sure to call and schedule a follow up appointment with your primary care provider in the next week for re-evaluation. I have provided the resource guide below for local clinics to establish a new PCP.  You can try ibuprofen or tylenol for symptomatic relief.  You may return to the Emergency Department at any time for worsening condition or any new symptoms that concern you. Return to ED if you develop worsening symptoms, shortness of breath, difficulty breathing, pain with deep inspiration, unilateral leg swelling/pain, or fever.   [employer] 211 is a great source of information about community services available.  Access by dialing 2-1-1 from anywhere in [place], or by website -  CustodianSupply.fi.   Other Local Resources (Updated 06/2015)  [place]    Phone Number and Address  [place] medical care - 1st and 3rd Saturday of every month  Must not qualify for public or private insurance and must have limited income [phone] [address] [place], [place]  Child care  Emergency assistance for housing and [employer]  Medicaid [phone] [address] [place] [address], [place] [address]   [facility] Department  Low-cost medical care for children, communicable diseases, sexually-transmitted diseases, immunizations, maternity care, womens health and family planning [phone] [address], [place] [address]  [facility]   Medication assistance for [facility] residents  Must meet  income requirements [phone] [place], [place].    [place]  Child care  Emergency assistance for housing and [employer]  Medicaid [phone] [address] [place], [place] [address]  [facility] [place]   Low-cost medical care,   Monday through Friday, 9 am to 6 pm.   Accepts Medicare/Medicaid, and self-pay [phone] [address] [place], [place] [address]  [facility] for [place] care - Monday through Friday, 8:30 am - 5:30 pm  Accepts Medicaid and self-pay [phone] [address] [place], [place] [address]   [facility]  Primary medical care, including for those with sickle cell disease  Accepts Medicare, Medicaid, insurance and self-pay [phone] [address], [place]  [facility]   Primary medical care  Accepts Medicare, [place], insurance and self-pay [phone] [address] [place], [place] [address]   [facility] Department of Social Services  Child care  Emergency assistance for housing and [employer]  Medicaid [phone] [place], [place] [address]  [place] Department of Health and [employer]  Child care  Emergency assistance for housing and [employer]  Medicaid [phone] [place], [place] [address]   [facility] Medication Assistance Program  Medication assistance for [facility] residents with no insurance only  Must have a primary care doctor [phone] [address], [place], [place]  [facility] [address]   Primary medical care  [place], [place], insurance  [phone] [place], [place]  [facility]   Medication  assistance [phone]  [facility]   Primary medical care  Accepts  Medicare, Medicaid, insurance and self-pay [phone] [address], [place] [address]  [facility]   Primary medical care  Accepts Medicare, [place], insurance and self-pay [phone] [address], [place] [address]  [facility]  For [place] residents between the ages of 46 and 87 who do not have any form of health insurance, Medicare, [place], or [place] benefits.  Services are provided free of charge to uninsured patients who fall within federal poverty guidelines.    Hours: Tuesdays and Thursdays, 4:15 - 8 pm [phone] [address] [place], [place] [address]  [facility]     Primary medical care  Dental care  Nutritional counseling  Pharmacy  Accepts Medicaid, Medicare, most insurance.  Fees are adjusted based on ability to pay.   [place], [place] [address] [place], [place], [place] [facility], [place], [place] [facility] [place], [place]  [facility]  Womens health and family planning [phone] [place]. [place], [place] care  Emergency assistance for housing and [employer]  Medicaid [phone] [address], [place], [place] [address]   [facility]    Ages 79 and older  Hours: Mondays and Thursdays, 7:00 am - 9:00 am Patients are seen on a first come, first served basis. [phone], ext. [place], [place]  Child care  Emergency assistance for housing and [employer]  Medicaid [phone]  [address], [place] [address]  [facility]  Medication assistance  Rental assistance  Food pantry  Medication assistance  Housing assistance  Emergency food distribution  Utility assistance [place], [place]  [address] [place], [place] [address] Hours: Tuesdays and Thursdays from 9am - 12 noon by appointment only  [place],  [address]  [facility] [place] private insurance, [place], and [place].  Payment is based on a sliding scale for those without insurance.  Hours: Mondays, Tuesdays and Thursdays, 8:30 am - 5:30 pm.   [phone] [place], [place]  [facility] - Family Medicine at [facility], [place], and [place].  Payment is based on a sliding scale for those without insurance. [phone] [address] [place], [place]  [facility] - Pediatrics at [address] Scient[address], research (medical), [employer], and [place].  Payment is based on a sliding scale for those without insurance [phone] [address], [employer], [place]  [facility] - Pediatrics at [employer], [place], and [place].  Payment is based on a sliding scale for those without insurance. [phone] [place], [place]  [facility] - Pediatrics at [facility], [place], and [place].  Payment is based on a sliding scale for those without insurance. [phone], ext. [phone] [place], [place].    [facility]  care.  Accepts Medicaid and self-pay. [phone] [place], [place]     Nonspecific Chest Pain It is often hard to find the cause of chest pain. There is always a chance that  your pain could be related to something serious, such as a heart attack or a blood clot in your lungs. Chest pain can also be caused by conditions that are not life-threatening. If you have chest pain, it is very important to follow up with your doctor.  HOME CARE  If you were prescribed an antibiotic medicine, finish it all even if you start to feel better.  Avoid any activities that cause chest pain.  Do not use any tobacco products, including cigarettes, chewing tobacco, or electronic cigarettes. If you need help quitting, ask your doctor.  Do not drink alcohol.  Take medicines only as told by your doctor.  Keep all follow-up visits as told by your doctor. This is important. This includes any further testing if your chest pain does not go away.  Your doctor may tell you to keep your head raised (elevated) while you sleep.  Make lifestyle changes as told by your doctor. These may include:  Getting regular exercise. Ask your doctor to suggest some activities that are safe for you.  Eating a heart-healthy diet. Your doctor or a diet specialist (dietitian) can help you to learn healthy eating options.  Maintaining a healthy weight.  Managing diabetes, if necessary.  Reducing stress. GET HELP IF:  Your chest pain does not go away, even after treatment.  You have a rash with blisters on your chest.  You have a fever. GET HELP RIGHT AWAY IF:  Your chest pain is worse.  You have an increasing cough, or you cough up blood.  You have severe belly (abdominal) pain.  You feel extremely weak.  You pass out (faint).  You have chills.  You have sudden, unexplained chest discomfort.  You have sudden, unexplained discomfort in your arms, back, neck, or jaw.  You have shortness of breath at any time.  You suddenly start to sweat, or your skin gets clammy.  You feel nauseous.  You vomit.  You suddenly feel light-headed or dizzy.  Your heart begins to beat quickly, or  it feels like it is skipping beats. These symptoms may be an emergency. Do not wait to see if the symptoms will go away. Get medical help right away. Call your local emergency services (911 in the U.S.). Do not drive yourself to the hospital.   This information is not intended to replace advice given to you by your health care provider. Make sure you discuss any questions you have with your health care provider.   Document Released: 11/13/2007 Document Revised: 06/17/2014 Document Reviewed: 12/31/2013 Elsevier Interactive Patient Education [employer].

## 2015-12-20 ENCOUNTER — Telehealth: Payer: Self-pay | Admitting: Gastroenterology

## 2015-12-20 DIAGNOSIS — K50119 Crohn's disease of large intestine with unspecified complications: Secondary | ICD-10-CM

## 2015-12-20 NOTE — Telephone Encounter (Signed)
Left message on machine to call back  

## 2015-12-21 NOTE — Telephone Encounter (Signed)
Left message on machine to call back  

## 2015-12-22 ENCOUNTER — Other Ambulatory Visit: Payer: Self-pay | Admitting: Gastroenterology

## 2015-12-22 ENCOUNTER — Other Ambulatory Visit (INDEPENDENT_AMBULATORY_CARE_PROVIDER_SITE_OTHER): Payer: Medicaid Other

## 2015-12-22 ENCOUNTER — Encounter: Payer: Self-pay | Admitting: Gastroenterology

## 2015-12-22 DIAGNOSIS — K50119 Crohn's disease of large intestine with unspecified complications: Secondary | ICD-10-CM

## 2015-12-22 LAB — CBC WITH DIFFERENTIAL/PLATELET
BASOS ABS: 0 10*3/uL (ref 0.0–0.1)
Basophils Relative: 0.5 % (ref 0.0–3.0)
Eosinophils Absolute: 0.1 10*3/uL (ref 0.0–0.7)
Eosinophils Relative: 1.6 % (ref 0.0–5.0)
HEMATOCRIT: 39.6 % (ref 36.0–46.0)
HEMOGLOBIN: 13.5 g/dL (ref 12.0–15.0)
LYMPHS PCT: 49.3 % — AB (ref 12.0–46.0)
Lymphs Abs: 3.1 10*3/uL (ref 0.7–4.0)
MCHC: 34.2 g/dL (ref 30.0–36.0)
MCV: 87.2 fl (ref 78.0–100.0)
MONOS PCT: 5.1 % (ref 3.0–12.0)
Monocytes Absolute: 0.3 10*3/uL (ref 0.1–1.0)
NEUTROS PCT: 43.5 % (ref 43.0–77.0)
Neutro Abs: 2.7 10*3/uL (ref 1.4–7.7)
Platelets: 266 10*3/uL (ref 150.0–400.0)
RBC: 4.55 Mil/uL (ref 3.87–5.11)
RDW: 13.9 % (ref 11.5–15.5)
WBC: 6.2 10*3/uL (ref 4.0–10.5)

## 2015-12-22 LAB — COMPREHENSIVE METABOLIC PANEL
ALBUMIN: 4.2 g/dL (ref 3.5–5.2)
ALK PHOS: 60 U/L (ref 39–117)
ALT: 14 U/L (ref 0–35)
AST: 16 U/L (ref 0–37)
BILIRUBIN TOTAL: 0.6 mg/dL (ref 0.2–1.2)
BUN: 8 mg/dL (ref 6–23)
CALCIUM: 9.5 mg/dL (ref 8.4–10.5)
CO2: 22 mEq/L (ref 19–32)
Chloride: 108 mEq/L (ref 96–112)
Creatinine, Ser: 0.83 mg/dL (ref 0.40–1.20)
GFR: 106.18 mL/min (ref >60.00)
GLUCOSE: 82 mg/dL (ref 70–99)
Potassium: 4.2 mEq/L (ref 3.5–5.1)
Sodium: 137 mEq/L (ref 135–145)
TOTAL PROTEIN: 7.7 g/dL (ref 6.0–8.3)

## 2015-12-22 LAB — SEDIMENTATION RATE: Sed Rate: 25 mm/hr — ABNORMAL HIGH (ref 0–20)

## 2015-12-22 NOTE — Telephone Encounter (Signed)
Pt notified that she needs a follow up with our office to discuss symptoms. Dr [name] the pt would like a refill on her prednisone until appt with Janett Billow 12/27/15.  Her last humira was 2 weeks ago and is due for today.  She is 2 shots behind schedule, she says it is not working.  Please advise

## 2015-12-22 NOTE — Telephone Encounter (Signed)
Pt has been notified and will come to the lab today, she was notified to come by and pick up the lab form prior to the lab draw.  She was advised to have Humira today AFTER the lab appt.

## 2015-12-22 NOTE — Telephone Encounter (Signed)
She's really been non-compliant with her Sharon Clayton (now and also previously documented).  She needs to take the humira today.  I don't like the idea of her back on prednisone for now.  She needs cbc, cmet, ESR, humira antibody lab test, and humira drug level (today, BEFORE her humira injection).  thanks

## 2015-12-25 LAB — PROMETHEUS-MAIL

## 2015-12-27 ENCOUNTER — Ambulatory Visit (INDEPENDENT_AMBULATORY_CARE_PROVIDER_SITE_OTHER): Payer: Medicaid Other | Admitting: Gastroenterology

## 2015-12-27 ENCOUNTER — Encounter: Payer: Self-pay | Admitting: Gastroenterology

## 2015-12-27 VITALS — BP 102/54 | HR 80 | Ht 62.0 in | Wt 176.4 lb

## 2015-12-27 DIAGNOSIS — K50919 Crohn's disease, unspecified, with unspecified complications: Secondary | ICD-10-CM | POA: Diagnosis not present

## 2015-12-27 DIAGNOSIS — K5090 Crohn's disease, unspecified, without complications: Secondary | ICD-10-CM | POA: Insufficient documentation

## 2015-12-27 MED ORDER — PREDNISONE 10 MG PO TABS: 10.0000 mg | ORAL_TABLET | Freq: Every day | ORAL | Status: DC

## 2015-12-27 NOTE — Progress Notes (Signed)
     12/27/2015 Sharon Clayton [id number] 09-Jan-1991  Review of pertinent gastrointestinal problems: 1. Crohn's ileitis: presented with diarrhea 2015, elevated sed rate; Colonoscopy 05/2014 [name] found completely normal colon but ulcerated terminal ileum; biopsies TI showed chronic inflammation, biopsies of right and left colon were normal.  Started on prednisone 10 twice daily with good response. TPMT enzyme activity 05/2014 was normal.  Azathiaprine 138m daily was started 06/2014; could not taper steroids below 19mper day, repeat CT scan showed distal 1/3 of ileum was thickened.  She stopped azathioprine due to swelling in her legs, we asked her to continue however she adamantly declined.  02/2015 started biologics (humira) (01/2015 quant gold and Hep b testing negative).  05/2015 sed rate and CRP were normal. 08/2015 Pregnant, this was unplanned and she terminated the pregnancy.  Update 12/2015:  Has been non-compliant with her Humira.  Last dose 7/14 and Humira levels/concentration along with antibodies obtained but missed her 6/30 dose so will not be accurate.  History of Present Illness:  [age]ear old female with Crohn's ileitis known to Dr. JaArdis HughsComes in today for follow-up. Complaining of diarrhea 4-5 times a day, saying that sometimes it feels like she has 2 gallons of stool at times. Complains of a lot of mucus and occasional blood. Also has abdominal pain. Says that she's had accidents where she didn't make it to the bathroom. She tells me that she's had a lot of anxiety since she lost her job and is now struggling to support her children. Is asking to discuss with Dr. JaArdis Hughsegarding writing her a letter for disability. She was in the ER recently for anxiety. Tells me she does not feel like the Humira is working. She missed her June 30 dose but then did take her July 14 dose.  Current Medications, Allergies, Past Medical History, Past Surgical History, Family History and Social History were  reviewed in [employer].   Physical Exam: BP 102/54 mmHg  Pulse 80  Ht 5' 2"  (1.575 m)  Wt 176 lb 6 oz (80.003 kg)  BMI 32.25 kg/m2  LMP 12/01/2015  Breastfeeding? No General: Well developed black female in no acute distress; tearful Head: Normocephalic and atraumatic Eyes:  Sclerae anicteric, conjunctiva pink  Ears: Normal auditory acuity Lungs: Clear throughout to auscultation Heart: Regular rate and rhythm Abdomen: Soft, non-distended.  Normal bowel sounds.  Minimal to no TTP currently. Musculoskeletal: Symmetrical with no gross deformities  Extremities: No edema  Neurological: Alert oriented x 4, grossly non-focal Psychological:  Alert and cooperative. Normal mood and affect  Assessment and Recommendations: 89.o. female with Crohn's ileitis  She's been non-complaint recently and in the past.  I discussed with Dr. JaArdis HughsRecent Humira levels/trough will not be accurate since she had missed her dose of medication. We will repeat this prior to her next dose to try to get an accurate reading. Also awaiting antibodies. In the interim I'm going to give her prednisone taper starting at 40 mg and tapering by 10 mg weekly over the next month. Will follow up with Dr. JaArdis Hughsn 6 weeks. Patient is asking and requesting a disability letter from Dr. JaArdis Hughsince she lost her job, however, he declined currently and she can discuss this with him further at her follow-up if she desires.  I emphasized that she must be compliant in order to build a good relationship and to be able to treat her appropriately.

## 2015-12-27 NOTE — Patient Instructions (Addendum)
We have sent the following medications to your pharmacy for you to pick up at your convenience: Prednisone 1m   Please start taking the Prednisone taper dose as follows:  Week 1 take 4 pills a day for 1 week  Week 2 take 3 pills a day for 1 week  Week 3 take 2 pills a day for 1 week  Week 4 take 1 pill a day for one week   Please have your lab drawn on 01-04-2016

## 2015-12-28 NOTE — Progress Notes (Signed)
i agree with the above note, plan 

## 2016-01-07 ENCOUNTER — Emergency Department (HOSPITAL_COMMUNITY)
Admission: EM | Admit: 2016-01-07 | Discharge: 2016-01-07 | Disposition: A | Discharge status: Home / Self Care | Payer: Medicaid Other | Attending: Emergency Medicine | Admitting: Emergency Medicine

## 2016-01-07 ENCOUNTER — Encounter (HOSPITAL_COMMUNITY): Payer: Self-pay

## 2016-01-07 DIAGNOSIS — F17210 Nicotine dependence, cigarettes, uncomplicated: Secondary | ICD-10-CM | POA: Insufficient documentation

## 2016-01-07 DIAGNOSIS — R197 Diarrhea, unspecified: Secondary | ICD-10-CM | POA: Insufficient documentation

## 2016-01-07 DIAGNOSIS — R1110 Vomiting, unspecified: Secondary | ICD-10-CM | POA: Insufficient documentation

## 2016-01-07 DIAGNOSIS — R109 Unspecified abdominal pain: Secondary | ICD-10-CM | POA: Diagnosis present

## 2016-01-07 LAB — CBC
HCT: 37.9 % (ref 36.0–46.0)
HEMOGLOBIN: 12.9 g/dL (ref 12.0–15.0)
MCH: 29.8 pg (ref 26.0–34.0)
MCHC: 34 g/dL (ref 30.0–36.0)
MCV: 87.5 fL (ref 78.0–100.0)
Platelets: 286 10*3/uL (ref 150–400)
RBC: 4.33 MIL/uL (ref 3.87–5.11)
RDW: 13.2 % (ref 11.5–15.5)
WBC: 7.5 10*3/uL (ref 4.0–10.5)

## 2016-01-07 LAB — URINALYSIS, ROUTINE W REFLEX MICROSCOPIC
Bilirubin Urine: NEGATIVE
GLUCOSE, UA: NEGATIVE mg/dL
HGB URINE DIPSTICK: NEGATIVE
Ketones, ur: NEGATIVE mg/dL
Leukocytes, UA: NEGATIVE
Nitrite: NEGATIVE
PH: 6.5 (ref 5.0–8.0)
Protein, ur: NEGATIVE mg/dL
SPECIFIC GRAVITY, URINE: 1.031 — AB (ref 1.005–1.030)

## 2016-01-07 LAB — COMPREHENSIVE METABOLIC PANEL
ALK PHOS: 61 U/L (ref 38–126)
ALT: 15 U/L (ref 14–54)
AST: 19 U/L (ref 15–41)
Albumin: 4.1 g/dL (ref 3.5–5.0)
Anion gap: 6 (ref 5–15)
BILIRUBIN TOTAL: 0.9 mg/dL (ref 0.3–1.2)
BUN: 13 mg/dL (ref 6–20)
CALCIUM: 9 mg/dL (ref 8.9–10.3)
CO2: 27 mmol/L (ref 22–32)
CREATININE: 0.95 mg/dL (ref 0.44–1.00)
Chloride: 108 mmol/L (ref 101–111)
GFR calc Af Amer: >60 mL/min (ref >60)
GLUCOSE: 66 mg/dL (ref 65–99)
POTASSIUM: 3.5 mmol/L (ref 3.5–5.1)
Sodium: 141 mmol/L (ref 135–145)
TOTAL PROTEIN: 7.4 g/dL (ref 6.5–8.1)

## 2016-01-07 LAB — LIPASE, BLOOD: Lipase: 20 U/L (ref 11–51)

## 2016-01-07 LAB — I-STAT BETA HCG BLOOD, ED (MC, WL, AP ONLY): I-stat hCG, quantitative: <5 m[IU]/mL (ref <5)

## 2016-01-07 MED ORDER — DICYCLOMINE HCL 20 MG PO TABS
20.0000 mg | ORAL_TABLET | Freq: Once | ORAL | Status: AC
  Administered 2016-01-07: 20 mg via ORAL
  Filled 2016-01-07: qty 1

## 2016-01-07 MED ORDER — HYDROMORPHONE HCL 1 MG/ML IJ SOLN
0.5000 mg | Freq: Once | INTRAMUSCULAR | Status: AC
  Administered 2016-01-07: 0.5 mg via INTRAVENOUS
  Filled 2016-01-07: qty 1

## 2016-01-07 MED ORDER — ONDANSETRON HCL 4 MG/2ML IJ SOLN
4.0000 mg | Freq: Once | INTRAMUSCULAR | Status: AC
  Administered 2016-01-07: 4 mg via INTRAVENOUS
  Filled 2016-01-07: qty 2

## 2016-01-07 MED ORDER — DICYCLOMINE HCL 20 MG PO TABS: 20.0000 mg | ORAL_TABLET | Freq: Two times a day (BID) | ORAL | 0 refills | Status: DC

## 2016-01-07 MED ORDER — CHOLESTYRAMINE 4 G PO PACK: 4.0000 g | PACK | Freq: Three times a day (TID) | ORAL | 2 refills | Status: DC

## 2016-01-07 MED ORDER — SODIUM CHLORIDE 0.9 % IV BOLUS (SEPSIS)
1000.0000 mL | Freq: Once | INTRAVENOUS | Status: AC
  Administered 2016-01-07: 1000 mL via INTRAVENOUS

## 2016-01-07 MED ORDER — OXYCODONE-ACETAMINOPHEN 5-325 MG PO TABS
1.0000 | ORAL_TABLET | Freq: Once | ORAL | Status: DC
Start: 2016-01-07 — End: 2016-01-08
  Filled 2016-01-07: qty 1

## 2016-01-07 NOTE — ED Provider Notes (Signed)
[facility] DEPT Provider Note   CSN: [id number] Arrival date & time: 01/07/16  1704  First Provider Contact:  First MD Initiated Contact with Patient 01/07/16 1847        History   Chief Complaint Chief Complaint  Patient presents with  . Abdominal Pain  . Emesis    HPI Sharon Clayton is a 25 y.o. female who present with cc of diarrhea and  , And abdominal cramping. She has a past medical history of Crohn's disease, especially terminal ileitis. Patient states that she has not been in remission for several years. She missed one dose of Humira because she felt it was no longer working for her. She saw Dr. [name], her GI specialist on 12/27/2015 and was started on a prednisone taper. She states she had about 4 days of relief. However, her diarrhea return. She is currently having 7-8 voluminous watery stools every day. She denies blood or mucus in her stools. She complains of significant tenesmus and abdominal cramping without focal abdominal tenderness   HPI  Past Medical History:  Diagnosis Date  . Bacterial infection 09/10/11  . Crohn's disease ([place])   . H/O varicella 09/10/11  . Hypotension   . IBS (irritable bowel syndrome)   . Low iron 09/10/11  . Trichomonas 09/10/10  . Yeast infection 09/10/11    Patient Active Problem List   Diagnosis Date Noted  . Crohn's disease with complication ([place]) 57/06/7791  . S/P cesarean section 09/04/2013  . GBS (group B Streptococcus carrier), +RV culture, currently pregnant 06/12/2013  . Smoker 06/08/2013  . Hx of preeclampsia, prior pregnancy, currently pregnant 06/08/2013  . IBS (irritable bowel syndrome) 06/08/2013  . Uses marijuana 04/13/2013  . Previous cesarean delivery, antepartum condition or complication 90/30/0923    Past Surgical History:  Procedure Laterality Date  . CESAREAN SECTION  09/10/11   NRFHT - primary low transverse cesarean section   . CESAREAN SECTION N/A 09/04/2013   Procedure: CESAREAN SECTION- repeat;  Surgeon:  Osborne Oman, MD;  Location: [facility];  Service: Obstetrics;  Laterality: N/A;    OB History    Gravida Para Term Preterm AB Living   4 2 2  0 1 2   SAB TAB Ectopic Multiple Live Births   1 0 0 0         Home Medications    Prior to Admission medications   Medication Sig Start Date End Date Taking? Authorizing Provider  alum & mag hydroxide-simeth (MAALOX/MYLANTA) 200-200-20 MG/5ML suspension Take 30 mLs by mouth every 6 (six) hours as needed for indigestion or heartburn (indigestion).     Historical Provider, MD  aluminum-magnesium hydroxide-simethicone (MAALOX) 300-762-26 MG/5ML SUSP Take 30 mLs by mouth daily as needed (for indigestion).    Historical Provider, MD  cetirizine (ZYRTEC) 10 MG tablet Take 10 mg by mouth at bedtime as needed for allergies.     Historical Provider, MD  DIETARY MANAGEMENT PRODUCT PO Take 1 tablet by mouth every other day. *Wheat Grass*    Historical Provider, MD  Diphenhydramine-PE-APAP (THERAFLU EXPRESSMAX) 12.5-5-325 MG/15ML LIQD Take 30 mLs by mouth once.    Historical Provider, MD  fluticasone (FLONASE) 50 MCG/ACT nasal spray Place 2 sprays into both nostrils daily. Patient taking differently: Place 2 sprays into both nostrils daily as needed for allergies.  10/12/14   Robyn M Hess, PA-C  HUMIRA PEN 40 MG/0.8ML PNKT INJECT 40MG SUBCUTANEOUSLY EVERY OTHER WEEK Patient taking differently: INJECT 40MG SUBCUTANEOUSLY EVERY OTHER WEEK on Friday's 10/26/15  Milus Banister, MD  HYDROcodone-acetaminophen (NORCO/VICODIN) 5-325 MG tablet Take 0.5-1 tablets by mouth every 6 (six) hours as needed for moderate pain.    Historical Provider, MD  ibuprofen (ADVIL,MOTRIN) 200 MG tablet Take 200 mg by mouth once.    Historical Provider, MD  ibuprofen (ADVIL,MOTRIN) 800 MG tablet Take 800 mg by mouth every 8 (eight) hours as needed for moderate pain.    Historical Provider, MD  predniSONE (DELTASONE) 10 MG tablet Take 1 tablet (10 mg total) by mouth daily with breakfast.  12/27/15   Laban Emperor Zehr, PA-C  prenatal vitamin w/FE, FA (PRENATAL 1 + 1) 27-1 MG TABS tablet Take 1 tablet by mouth daily. 11/14/15   Roxanna Mew, PA-C    Family History Family History  Problem Relation Age of Onset  . Hypertension Mother   . Hypertension Father   . Colon cancer Neg Hx     Social History Social History  Substance Use Topics  . Smoking status: Current Every Day Smoker    Packs/day: 0.10    Years: 8.00    Types: Cigarettes  . Smokeless tobacco: Never Used     Comment: e-cig  . Alcohol use No     Comment: occ     Allergies   Lactose intolerance (gi) and Flagyl [metronidazole]   Review of Systems Review of Systems Ten systems reviewed and are negative for acute change, except as noted in the HPI.    Physical Exam Updated Vital Signs BP (!) 148/114 (BP Location: Right Arm)   Pulse 60   Temp 98.9 F (37.2 C) (Oral)   Resp 19   LMP 12/31/2015   SpO2 100%   Physical Exam  Constitutional: She is oriented to person, place, and time. She appears well-developed and well-nourished. No distress.  HENT:  Head: Normocephalic and atraumatic.  Eyes: Conjunctivae are normal. No scleral icterus.  Neck: Normal range of motion.  Cardiovascular: Normal rate, regular rhythm and normal heart sounds.  Exam reveals no gallop and no friction rub.   No murmur heard. Pulmonary/Chest: Effort normal and breath sounds normal. No respiratory distress.  Abdominal: Soft. Bowel sounds are normal. She exhibits no distension and no mass. There is no tenderness. There is no guarding.  Neurological: She is alert and oriented to person, place, and time.  Skin: Skin is warm and dry. She is not diaphoretic.  Nursing note and vitals reviewed.    ED Treatments / Results  Labs (all labs ordered are listed, but only abnormal results are displayed) Labs Reviewed  URINALYSIS, ROUTINE W REFLEX MICROSCOPIC (NOT AT [facility]) - Abnormal; Notable for the following:       Result Value     APPearance CLOUDY (*)    Specific Gravity, Urine 1.031 (*)    All other components within normal limits  LIPASE, BLOOD  COMPREHENSIVE METABOLIC PANEL  CBC  I-STAT BETA HCG BLOOD, ED ([facility], [facility], [facility] ONLY)    EKG  EKG Interpretation None       Radiology No results found.  Procedures Procedures (including critical care time)  Medications Ordered in ED Medications  sodium chloride 0.9 % bolus 1,000 mL (1,000 mLs Intravenous New Bag/Given 01/07/16 1919)  HYDROmorphone (DILAUDID) injection 0.5 mg (0.5 mg Intravenous Given 01/07/16 1920)  ondansetron (ZOFRAN) injection 4 mg (4 mg Intravenous Given 01/07/16 1920)  dicyclomine (BENTYL) tablet 20 mg (20 mg Oral Given 01/07/16 1920)     Initial Impression / Assessment and Plan / ED Course  I have  reviewed the triage vital signs and the nursing notes.  Pertinent labs & imaging results that were available during my care of the patient were reviewed by me and considered in my medical decision making (see chart for details).  Clinical Course   Patient labs are without significant abnormality. Patient states that her pain is much improved. However, she is not like the narcotic pain medicine because it makes her feel "loopy and high." The Bentyl has significantly improved. Abdominal cramping and tenesmus feeling. The patient will be discharged with Bentyl and cholestyramine. She would like to review the medications with Dr. [name] before taking them. She has a benign abdominal exam with no focal tenderness on examination. Afebrile, hemodynamically stable, without elevated white blood cell count. The patient does not warrant CT exam at this time. He feels she is safe for discharge.  Final Clinical Impressions(s) / ED Diagnoses   Final diagnoses:  Diarrhea, unspecified type  Abdominal cramping    New Prescriptions Discharge Medication List as of 01/07/2016  8:48 PM    START taking these medications   Details  cholestyramine (QUESTRAN) 4 g  packet Take 1 packet (4 g total) by mouth 3 (three) times daily with meals., Starting Sun 01/07/2016, Print    dicyclomine (BENTYL) 20 MG tablet Take 1 tablet (20 mg total) by mouth 2 (two) times daily., Starting Sun 01/07/2016, Print         Margarita Mail, PA-C 01/08/16 1718    Quintella Reichert, MD 01/10/16 1350

## 2016-01-07 NOTE — ED Triage Notes (Addendum)
Pt has hx of crohn's.  Pt here with abdominal pain, diarrhea, emesis x 3 days.  No fever.  No burning with urination.  Pt is more fatigued than normal.

## 2016-01-17 NOTE — Progress Notes (Signed)
01/17/2016 A. Fanny Agan RNCM 1941pm EDCM received phone call from patient who reports she has misplaced her prescription.  Patient was seen in the ED on 07/30.  Patient prescribed Bentyl and Questran. Patient has not gotten either of her prescriptions filled. [facility] discussed patient with EDP. [facility] informed patient that her prescriptions will not be able to be printed up to be given to her since she has been seen over a week ago. EDCM suggested making an appointment with her pcp.  Patient reports she is in the process of leaving her pcp. EDCM suggested asking her GI specialist.  Patient reports she is not due to see them until September. [facility] informed patient that if she feels she needs to come back to the ED to be seen, she should do so. No further EDCM needs at this time.

## 2016-02-08 ENCOUNTER — Emergency Department (HOSPITAL_COMMUNITY): Payer: Medicaid Other

## 2016-02-08 ENCOUNTER — Encounter (HOSPITAL_COMMUNITY): Payer: Self-pay | Admitting: Emergency Medicine

## 2016-02-08 ENCOUNTER — Emergency Department (HOSPITAL_COMMUNITY)
Admission: EM | Admit: 2016-02-08 | Discharge: 2016-02-08 | Disposition: A | Discharge status: Home / Self Care | Payer: Medicaid Other | Attending: Emergency Medicine | Admitting: Emergency Medicine

## 2016-02-08 DIAGNOSIS — F17210 Nicotine dependence, cigarettes, uncomplicated: Secondary | ICD-10-CM | POA: Insufficient documentation

## 2016-02-08 DIAGNOSIS — E860 Dehydration: Secondary | ICD-10-CM | POA: Insufficient documentation

## 2016-02-08 DIAGNOSIS — Z79891 Long term (current) use of opiate analgesic: Secondary | ICD-10-CM | POA: Diagnosis not present

## 2016-02-08 DIAGNOSIS — Z79899 Other long term (current) drug therapy: Secondary | ICD-10-CM | POA: Insufficient documentation

## 2016-02-08 DIAGNOSIS — R197 Diarrhea, unspecified: Secondary | ICD-10-CM | POA: Diagnosis present

## 2016-02-08 LAB — COMPREHENSIVE METABOLIC PANEL
ALBUMIN: 4.3 g/dL (ref 3.5–5.0)
ALK PHOS: 66 U/L (ref 38–126)
ALT: 26 U/L (ref 14–54)
AST: 20 U/L (ref 15–41)
Anion gap: 6 (ref 5–15)
BUN: 10 mg/dL (ref 6–20)
CALCIUM: 9.7 mg/dL (ref 8.9–10.3)
CHLORIDE: 107 mmol/L (ref 101–111)
CO2: 24 mmol/L (ref 22–32)
CREATININE: 0.9 mg/dL (ref 0.44–1.00)
GFR calc Af Amer: >60 mL/min (ref >60)
GFR calc non Af Amer: >60 mL/min (ref >60)
GLUCOSE: 98 mg/dL (ref 65–99)
Potassium: 3.8 mmol/L (ref 3.5–5.1)
SODIUM: 137 mmol/L (ref 135–145)
Total Bilirubin: 0.5 mg/dL (ref 0.3–1.2)
Total Protein: 8.1 g/dL (ref 6.5–8.1)

## 2016-02-08 LAB — URINALYSIS, ROUTINE W REFLEX MICROSCOPIC
BILIRUBIN URINE: NEGATIVE
GLUCOSE, UA: NEGATIVE mg/dL
HGB URINE DIPSTICK: NEGATIVE
Ketones, ur: NEGATIVE mg/dL
Leukocytes, UA: NEGATIVE
Nitrite: NEGATIVE
PROTEIN: NEGATIVE mg/dL
SPECIFIC GRAVITY, URINE: 1.013 (ref 1.005–1.030)
pH: 8 (ref 5.0–8.0)

## 2016-02-08 LAB — CBC
HCT: 38.7 % (ref 36.0–46.0)
Hemoglobin: 13.5 g/dL (ref 12.0–15.0)
MCH: 30.5 pg (ref 26.0–34.0)
MCHC: 34.9 g/dL (ref 30.0–36.0)
MCV: 87.4 fL (ref 78.0–100.0)
PLATELETS: 247 10*3/uL (ref 150–400)
RBC: 4.43 MIL/uL (ref 3.87–5.11)
RDW: 12.9 % (ref 11.5–15.5)
WBC: 4.7 10*3/uL (ref 4.0–10.5)

## 2016-02-08 LAB — LIPASE, BLOOD: LIPASE: 24 U/L (ref 11–51)

## 2016-02-08 MED ORDER — PREDNISONE 10 MG PO TABS: 60.0000 mg | ORAL_TABLET | Freq: Every day | ORAL | 0 refills | Status: DC

## 2016-02-08 MED ORDER — LACTATED RINGERS IV BOLUS (SEPSIS)
2000.0000 mL | Freq: Once | INTRAVENOUS | Status: AC
  Administered 2016-02-08: 2000 mL via INTRAVENOUS

## 2016-02-08 MED ORDER — CHOLESTYRAMINE 4 G PO PACK: 4.0000 g | PACK | Freq: Three times a day (TID) | ORAL | 2 refills | Status: DC

## 2016-02-08 MED ORDER — DICYCLOMINE HCL 20 MG PO TABS: 20.0000 mg | ORAL_TABLET | Freq: Two times a day (BID) | ORAL | 0 refills | Status: DC

## 2016-02-08 NOTE — ED Triage Notes (Signed)
Pt states hx of crohn's disease.  Pt states that she woke up at 5am today with diarrhea, weakness, and nausea.  Pt states that she has not vomited. She describes an episode of SHOB when she woke up that has since resolved.  Denies pain.

## 2016-02-08 NOTE — ED Provider Notes (Signed)
[facility] DEPT Provider Note   CSN: [id number] Arrival date & time: 02/08/16  1224     History   Chief Complaint Chief Complaint  Patient presents with  . Diarrhea  . Weakness  . Nausea    HPI Sharon Clayton is a 25 y.o. female.  HPI  Pt comes in with cc of weakness/malaise. Pt has hx of crohn's dz on humera x 1 year.  Pt reports that she started having some malaise on Sunday. This morning pt started having diarrhea, nausea. She has mild abd pain. Pt has had > 5, loose BM, with brown/green mucus. Pt has no blood in the stool. Pt hasnt had emesis, but she has anorexia. Pt has mild periumbilical pain.   Past Medical History:  Diagnosis Date  . Bacterial infection 09/10/11  . Crohn's disease ([place])   . H/O varicella 09/10/11  . Hypotension   . IBS (irritable bowel syndrome)   . Low iron 09/10/11  . Trichomonas 09/10/10  . Yeast infection 09/10/11    Patient Active Problem List   Diagnosis Date Noted  . Crohn's disease with complication ([place]) 82/50/0370  . S/P cesarean section 09/04/2013  . GBS (group B Streptococcus carrier), +RV culture, currently pregnant 06/12/2013  . Smoker 06/08/2013  . Hx of preeclampsia, prior pregnancy, currently pregnant 06/08/2013  . IBS (irritable bowel syndrome) 06/08/2013  . Uses marijuana 04/13/2013  . Previous cesarean delivery, antepartum condition or complication 48/88/9169    Past Surgical History:  Procedure Laterality Date  . CESAREAN SECTION  09/10/11   NRFHT - primary low transverse cesarean section   . CESAREAN SECTION N/A 09/04/2013   Procedure: CESAREAN SECTION- repeat;  Surgeon: Osborne Oman, MD;  Location: [facility];  Service: Obstetrics;  Laterality: N/A;    OB History    Gravida Para Term Preterm AB Living   4 2 2  0 1 2   SAB TAB Ectopic Multiple Live Births   1 0 0 0 2       Home Medications    Prior to Admission medications   Medication Sig Start Date End Date Taking? Authorizing Provider  alum & mag  hydroxide-simeth (MAALOX/MYLANTA) 200-200-20 MG/5ML suspension Take 30 mLs by mouth every 6 (six) hours as needed for indigestion or heartburn.    Yes Historical Provider, MD  diphenhydramine-acetaminophen (TYLENOL PM) 25-500 MG TABS tablet Take 1 tablet by mouth at bedtime as needed (for pain/sleep).   Yes Historical Provider, MD  Docosahexaenoic Acid (DHA PO) Take 1 capsule by mouth at bedtime.   Yes Historical Provider, MD  ferrous sulfate 220 (44 Fe) MG/5ML solution Take 220 mg by mouth daily.   Yes Historical Provider, MD  fluticasone (FLONASE) 50 MCG/ACT nasal spray Place 2 sprays into both nostrils daily as needed for rhinitis.   Yes Historical Provider, MD  OVER THE COUNTER MEDICATION Take 1-2 capsules by mouth at bedtime. Medication:  Wheat grass   Yes Historical Provider, MD  Prenatal Vit-Fe Fumarate-FA (PRENATAL MULTIVITAMIN) TABS tablet Take 1 tablet by mouth at bedtime.   Yes Historical Provider, MD  sodium chloride (OCEAN) 0.65 % SOLN nasal spray Place 1 spray into both nostrils as needed for congestion.   Yes Historical Provider, MD  Adalimumab (HUMIRA) 40 MG/0.8ML PSKT Inject 40 mg into the skin every 14 (fourteen) days. Pt uses on Friday.    Historical Provider, MD  cetirizine (ZYRTEC) 10 MG tablet Take 10 mg by mouth at bedtime as needed for allergies.     Historical  Provider, MD  cholestyramine (QUESTRAN) 4 g packet Take 1 packet (4 g total) by mouth 3 (three) times daily with meals. 02/08/16   Varney Biles, MD  dicyclomine (BENTYL) 20 MG tablet Take 1 tablet (20 mg total) by mouth 2 (two) times daily. 02/08/16   Varney Biles, MD  predniSONE (DELTASONE) 10 MG tablet Take 6 tablets (60 mg total) by mouth daily. 02/08/16   Varney Biles, MD    Family History Family History  Problem Relation Age of Onset  . Hypertension Mother   . Hypertension Father   . Colon cancer Neg Hx     Social History Social History  Substance Use Topics  . Smoking status: Current Every Day Smoker      Packs/day: 0.10    Years: 8.00    Types: Cigarettes  . Smokeless tobacco: Never Used     Comment: e-cig  . Alcohol use No     Comment: occ     Allergies   Lactose intolerance (gi) and Flagyl [metronidazole]   Review of Systems Review of Systems  ROS 10 Systems reviewed and are negative for acute change except as noted in the HPI.     Physical Exam Updated Vital Signs BP 127/83 (BP Location: Left Arm)   Pulse 60   Temp 97.8 F (36.6 C) (Oral)   Resp 15   Ht 5' 2"  (1.575 m)   Wt 173 lb (78.5 kg)   LMP 01/26/2016   SpO2 100%   BMI 31.64 kg/m   Physical Exam  Constitutional: She is oriented to person, place, and time. She appears well-developed.  HENT:  Head: Normocephalic and atraumatic.  Eyes: EOM are normal.  Neck: Normal range of motion. Neck supple.  Cardiovascular: Normal rate.   Pulmonary/Chest: Effort normal and breath sounds normal.  Abdominal: Bowel sounds are normal. She exhibits no distension. There is no tenderness.  Neurological: She is alert and oriented to person, place, and time.  Skin: Skin is warm and dry. Capillary refill takes less than 2 seconds.  Nursing note and vitals reviewed.    ED Treatments / Results  Labs (all labs ordered are listed, but only abnormal results are displayed) Labs Reviewed  LIPASE, BLOOD  COMPREHENSIVE METABOLIC PANEL  CBC  URINALYSIS, ROUTINE W REFLEX MICROSCOPIC (NOT AT [facility])    EKG  EKG Interpretation None       Radiology Dg Chest 2 View  Result Date: 02/08/2016 CLINICAL DATA:  Cough and congestion for 4 days with acute shortness of breath today. EXAM: CHEST  2 VIEW COMPARISON:  11/14/2015 radiographs FINDINGS: The cardiomediastinal silhouette is unremarkable. There is no evidence of focal airspace disease, pulmonary edema, suspicious pulmonary nodule/mass, pleural effusion, or pneumothorax. No acute bony abnormalities are identified. IMPRESSION: No active cardiopulmonary disease. Electronically  Signed   By: Margarette Canada M.D.   On: 02/08/2016 11:36    Procedures Procedures (including critical care time)  Medications Ordered in ED Medications  lactated ringers bolus 2,000 mL (0 mLs Intravenous Stopped 02/08/16 1337)     Initial Impression / Assessment and Plan / ED Course  I have reviewed the triage vital signs and the nursing notes.  Pertinent labs & imaging results that were available during my care of the patient were reviewed by me and considered in my medical decision making (see chart for details).  Clinical Course    Pt comes in with cc of abd pain. Abd pain is mild, no severe pain. She has diarrhea - which occurs with  crohns flair up. She has some nausea, no emesis. She feels dehydrated and has malaise. She is immunosuppressed. We will order basic labs, hydrate and reassess. Anticipate d/c.  Final Clinical Impressions(s) / ED Diagnoses   Final diagnoses:  Dehydration    New Prescriptions New Prescriptions   PREDNISONE (DELTASONE) 10 MG TABLET    Take 6 tablets (60 mg total) by mouth daily.     Varney Biles, MD 02/08/16 [phone]

## 2016-02-08 NOTE — Discharge Instructions (Signed)
Take the meds prescribed as needed - and start steroids only if the diarrhea persists.

## 2016-03-06 ENCOUNTER — Other Ambulatory Visit (INDEPENDENT_AMBULATORY_CARE_PROVIDER_SITE_OTHER): Payer: Medicaid Other

## 2016-03-06 ENCOUNTER — Other Ambulatory Visit: Payer: Self-pay | Admitting: Gastroenterology

## 2016-03-06 ENCOUNTER — Ambulatory Visit (INDEPENDENT_AMBULATORY_CARE_PROVIDER_SITE_OTHER): Payer: Medicaid Other | Admitting: Gastroenterology

## 2016-03-06 ENCOUNTER — Encounter: Payer: Self-pay | Admitting: Gastroenterology

## 2016-03-06 VITALS — BP 100/70 | HR 76 | Ht 61.25 in | Wt 176.5 lb

## 2016-03-06 DIAGNOSIS — R109 Unspecified abdominal pain: Secondary | ICD-10-CM

## 2016-03-06 DIAGNOSIS — K50918 Crohn's disease, unspecified, with other complication: Secondary | ICD-10-CM

## 2016-03-06 LAB — SEDIMENTATION RATE: SED RATE: 12 mm/h (ref 0–20)

## 2016-03-06 LAB — HIGH SENSITIVITY CRP: CRP, High Sensitivity: 0.64 mg/L (ref 0.000–5.000)

## 2016-03-06 NOTE — Progress Notes (Signed)
Review of pertinent gastrointestinal problems: 1. Crohn's ileitis: presented with diarrhea 2015, elevated sed rate; Colonoscopy 05/2014 [name] found completely normal colon but ulcerated terminal ileum; biopsies TI showed chronic inflammation, biopsies of right and left colon were normal. Started on prednisone 10 twice daily with good response. TPMT enzyme activity 05/2014 was normal. Azathiaprine 121m daily was started 06/2014; could not taper steroids below 128mper day, repeat CT scan showed distal 1/3 of ileum was thickened. She stopped azathioprine due to swelling in her legs, we asked her to continue however she adamantly declined. 02/2015 started biologics (humira) (01/2015 quant gold and Hep b testing negative). 05/2015 sed rate and CRP were normal. 08/2015 Pregnant, this was unplanned and she terminated the pregnancy.  Update 12/2015:  Has been non-compliant with her Humira.  Last dose 7/14 and Humira levels/concentration along with antibodies obtained but missed her 6/30 dose so will not be accurate.  HPI: This is a [age]ear old woman whom I last saw several months ago. She was here in our office 2 months ago and saw JeJanett BillowShe was put on a tapering regimen of prednisone and we asked her to get trough levels of Humira. She never had that done.  Chief complaint is Crohn's disease  bloodwork 1 month ago: cbc, cmet, UA, lipase were all normal.  ROS: complete GI ROS as described in HPI.  Constitutional:  No unintentional weight loss  She is taking humira, says she is not missing doses.  LAst was on Friday.   Does not have BM every day.  When she does go it is either mushy or watery.  Will have 7 BMs per week.  Usually loose.  She can have 3 days per week of 4 loose stools.  History is very scattered.  She is intermittently telling me she has about 1 bowel movement a day and then she says 3-4 days a week she will have 4 very loose urgent stools. Will then she tells me she has only 6-7 bowel  movements total in a week.  Her appetite is poor. She will often forget to eat.  She will often have pain in LLQ, this is intermittent.  She takes prednisone PRN (when she has a "flare").  She will take 1 per day for 3-4 days.  This makes her eat.   She is concerned about some "new moles, freckles on her skin. She is also concerned about some left arm tingling, numbness periodically   Labs 2 months ago show no humira antibodies.    Past Medical History:  Diagnosis Date  . Bacterial infection 09/10/11  . [place]hn's disease ([place]  . H/O varicella 09/10/11  . Hypotension   . IBS (irritable bowel syndrome)   . Low iron 09/10/11  . Trichomonas 09/10/10  . Yeast infection 09/10/11    Past Surgical History:  Procedure Laterality Date  . CESAREAN SECTION  09/10/11   NRFHT - primary low transverse cesarean section   . CESAREAN SECTION N/A 09/04/2013   Procedure: CESAREAN SECTION- repeat;  Surgeon: UgOsborne OmanMD;  [facility];  Service: Obstetrics;  Laterality: N/A;    Current Outpatient Prescriptions  Medication Sig Dispense Refill  . Adalimumab (HUMIRA) 40 MG/0.8ML PSKT Inject 40 mg into the skin every 14 (fourteen) days. Pt uses on Friday.    . Marland Kitchenlum & mag hydroxide-simeth (MAALOX/MYLANTA) 200-200-20 MG/5ML suspension Take 30 mLs by mouth every 6 (six) hours as needed for indigestion or heartburn.     . cetirizine (ZYRTEC) 10  MG tablet Take 10 mg by mouth at bedtime as needed for allergies.     . cholestyramine (QUESTRAN) 4 g packet Take 1 packet (4 g total) by mouth 3 (three) times daily with meals. 60 each 2  . dicyclomine (BENTYL) 20 MG tablet Take 1 tablet (20 mg total) by mouth 2 (two) times daily. 30 tablet 0  . diphenhydramine-acetaminophen (TYLENOL PM) 25-500 MG TABS tablet Take 1 tablet by mouth at bedtime as needed (for pain/sleep).    . Docosahexaenoic Acid (DHA PO) Take 1 capsule by mouth at bedtime.    . ferrous sulfate 220 (44 Fe) MG/5ML solution Take 220 mg by mouth  daily.    . fluticasone (FLONASE) 50 MCG/ACT nasal spray Place 2 sprays into both nostrils daily as needed for rhinitis.    Marland Kitchen OVER THE COUNTER MEDICATION Take 1-2 capsules by mouth at bedtime. Medication:  Wheat grass    . predniSONE (DELTASONE) 10 MG tablet Take 6 tablets (60 mg total) by mouth daily. 30 tablet 0  . Prenatal Vit-Fe Fumarate-FA (PRENATAL MULTIVITAMIN) TABS tablet Take 1 tablet by mouth at bedtime.    . sodium chloride (OCEAN) 0.65 % SOLN nasal spray Place 1 spray into both nostrils as needed for congestion.     No current facility-administered medications for this visit.     Allergies as of 03/06/2016 - Review Complete 03/06/2016  Allergen Reaction Noted  . Lactose intolerance (gi) Diarrhea and Nausea And Vomiting 01/14/2013  . Flagyl [metronidazole] Itching and Rash 07/03/2013    Family History  Problem Relation Age of Onset  . Hypertension Mother   . Hypertension Father   . Colon cancer Neg Hx     Social History   Social History  . Marital status: Single    Spouse name: N/A  . Number of children: 2  . Years of education: N/A   Occupational History  . Homemaker    Social History Main Topics  . Smoking status: Current Every Day Smoker    Packs/day: 0.10    Years: 8.00    Types: Cigarettes  . Smokeless tobacco: Never Used     Comment: e-cig  . Alcohol use No     Comment: occ  . Drug use:     Types: Marijuana     Comment: denies 02/03/14  . Sexual activity: Yes    Birth control/ protection: None   Other Topics Concern  . Not on file   Social History Narrative  . No narrative on file     Physical Exam: BP 100/70 (BP Location: Right Arm, Patient Position: Sitting, Cuff Size: Normal)   Pulse 76   Ht 5' 1.25" (1.556 m)   Wt 80.1 kg (176 lb 8 oz)   LMP 02/24/2016   BMI 33.08 kg/m  Constitutional: generally well-appearing, obese  Psychiatric: alert and oriented x3 Abdomen: soft, nontender, nondistended, no obvious ascites, no peritoneal  signs, normal bowel sounds No peripheral edema noted in lower extremities  Assessment and plan: 25 y.o. female with Crohn's ileitis  it is really not clear to me how active her inflammatory bowel diseases. She tells me today she is taking her Humira every 2 weeks. I hope that is true. She has had previous noncompliance issues with it. Since her last visit here she never got the Humira trough levels which we scheduled. She completed her prednisone taper and then has been taking prednisone 2-3 times a week for flares. I explained to her she should not be taking prednisone  on a once in a while basis at all. She should never take prednisone   for her GI disease unless we instructor 2. I'm getting some labs to check for active inflammation including a CRP, sedimentation rate and she'll have an MR enterography as well. She mentioned some numbness, tingling intermittently in her left arm. She has no neck pains. She has noticed some very tiny new freckles on her elbows. I looked at these they look very innocent to me and very small. I offered referral to neurology and also dermatology and she would like to see them. She spent a lot of time at the end of the visit asking about disability forms. I explained to her that I will not be filling those out for her.  She'll return to see me in 4-6 weeks.   Owens Loffler, MD [facility] 03/06/2016, 10:31 AM

## 2016-03-06 NOTE — Patient Instructions (Addendum)
You will have labs checked today in the basement lab.  Please head down after you check out with the front desk  (CRP, sed rate). Stay on humira every other week dosing.  Labs for humira the day before your next dose (trough level of humira). We have given you a form to take to the lab with you on this appointment day.   Don't take prednisone unless directed by GI.  Do not take this PRN MR enterography for crohn's disease, ? Active inflammation.  You have been scheduled for an MRI at [facility] on 03/18/16. Your appointment time is 8 am. Please arrive 6:30 am minutes prior to your appointment time for registration purposes. Please make certain not to have anything to eat or drink 6 hours prior to your test. In addition, if you have any metal in your body, have a pacemaker or defibrillator, please be sure to let your ordering physician know. This test typically takes 45 minutes to 1 hour to complete. Please return to see Dr. [name] on 05/24/16 2:15 pm Referral to neurologist for left arm numbness.  Referral to dermatologist for new moles. We will call you with the appointments for your referrals.  Please call in 1 week if you have not heard from our office.

## 2016-03-08 ENCOUNTER — Other Ambulatory Visit: Payer: Self-pay

## 2016-03-08 DIAGNOSIS — R200 Anesthesia of skin: Secondary | ICD-10-CM

## 2016-03-08 DIAGNOSIS — D229 Melanocytic nevi, unspecified: Secondary | ICD-10-CM

## 2016-03-18 ENCOUNTER — Ambulatory Visit (HOSPITAL_COMMUNITY): Payer: Medicaid Other

## 2016-03-18 ENCOUNTER — Ambulatory Visit (HOSPITAL_COMMUNITY)
Admission: RE | Admit: 2016-03-18 | Discharge: 2016-03-18 | Disposition: A | Discharge status: Home / Self Care | Payer: Medicaid Other | Source: Ambulatory Visit | Attending: Gastroenterology | Admitting: Gastroenterology

## 2016-03-18 DIAGNOSIS — R109 Unspecified abdominal pain: Secondary | ICD-10-CM

## 2016-03-18 DIAGNOSIS — K50918 Crohn's disease, unspecified, with other complication: Secondary | ICD-10-CM

## 2016-03-18 MED ORDER — GADOBENATE DIMEGLUMINE 529 MG/ML IV SOLN
15.0000 mL | Freq: Once | INTRAVENOUS | Status: AC | PRN
  Administered 2016-03-18: 15 mL via INTRAVENOUS

## 2016-05-24 ENCOUNTER — Ambulatory Visit: Payer: Medicaid Other | Admitting: Gastroenterology

## 2016-06-18 ENCOUNTER — Encounter (HOSPITAL_COMMUNITY): Payer: Self-pay | Admitting: Emergency Medicine

## 2016-06-18 ENCOUNTER — Emergency Department (HOSPITAL_COMMUNITY)
Admission: EM | Admit: 2016-06-18 | Discharge: 2016-06-18 | Disposition: A | Discharge status: Home / Self Care | Payer: BLUE CROSS/BLUE SHIELD | Attending: Emergency Medicine | Admitting: Emergency Medicine

## 2016-06-18 DIAGNOSIS — Z79899 Other long term (current) drug therapy: Secondary | ICD-10-CM | POA: Insufficient documentation

## 2016-06-18 DIAGNOSIS — Z5321 Procedure and treatment not carried out due to patient leaving prior to being seen by health care provider: Secondary | ICD-10-CM | POA: Diagnosis not present

## 2016-06-18 DIAGNOSIS — R300 Dysuria: Secondary | ICD-10-CM | POA: Insufficient documentation

## 2016-06-18 DIAGNOSIS — F17210 Nicotine dependence, cigarettes, uncomplicated: Secondary | ICD-10-CM | POA: Insufficient documentation

## 2016-06-18 DIAGNOSIS — N898 Other specified noninflammatory disorders of vagina: Secondary | ICD-10-CM | POA: Insufficient documentation

## 2016-06-18 LAB — URINALYSIS, ROUTINE W REFLEX MICROSCOPIC
BILIRUBIN URINE: NEGATIVE
GLUCOSE, UA: NEGATIVE mg/dL
HGB URINE DIPSTICK: NEGATIVE
KETONES UR: NEGATIVE mg/dL
NITRITE: NEGATIVE
PH: 5 (ref 5.0–8.0)
PROTEIN: 30 mg/dL — AB
Specific Gravity, Urine: 1.024 (ref 1.005–1.030)

## 2016-06-18 LAB — POC URINE PREG, ED: Preg Test, Ur: NEGATIVE

## 2016-06-18 NOTE — ED Triage Notes (Signed)
Pt reports tingling in her urethra with urination for the past few days. Pt also wants pregnancy test and STD check. Has had vaginal discharge as well.

## 2016-07-14 ENCOUNTER — Emergency Department (HOSPITAL_COMMUNITY)
Admission: EM | Admit: 2016-07-14 | Discharge: 2016-07-14 | Disposition: A | Discharge status: Home / Self Care | Payer: BLUE CROSS/BLUE SHIELD | Attending: Emergency Medicine | Admitting: Emergency Medicine

## 2016-07-14 DIAGNOSIS — F17210 Nicotine dependence, cigarettes, uncomplicated: Secondary | ICD-10-CM | POA: Diagnosis not present

## 2016-07-14 DIAGNOSIS — K50919 Crohn's disease, unspecified, with unspecified complications: Secondary | ICD-10-CM | POA: Diagnosis not present

## 2016-07-14 DIAGNOSIS — R197 Diarrhea, unspecified: Secondary | ICD-10-CM | POA: Diagnosis present

## 2016-07-14 LAB — I-STAT BETA HCG BLOOD, ED (MC, WL, AP ONLY): I-stat hCG, quantitative: <5 m[IU]/mL (ref <5)

## 2016-07-14 LAB — CBC
HCT: 33.6 % — ABNORMAL LOW (ref 36.0–46.0)
HEMOGLOBIN: 11.2 g/dL — AB (ref 12.0–15.0)
MCH: 27.4 pg (ref 26.0–34.0)
MCHC: 33.3 g/dL (ref 30.0–36.0)
MCV: 82.2 fL (ref 78.0–100.0)
Platelets: 309 10*3/uL (ref 150–400)
RBC: 4.09 MIL/uL (ref 3.87–5.11)
RDW: 13.4 % (ref 11.5–15.5)
WBC: 4.6 10*3/uL (ref 4.0–10.5)

## 2016-07-14 LAB — COMPREHENSIVE METABOLIC PANEL
ALK PHOS: 89 U/L (ref 38–126)
ALT: 46 U/L (ref 14–54)
ANION GAP: 6 (ref 5–15)
AST: 37 U/L (ref 15–41)
Albumin: 3.4 g/dL — ABNORMAL LOW (ref 3.5–5.0)
BUN: 8 mg/dL (ref 6–20)
CALCIUM: 9 mg/dL (ref 8.9–10.3)
CO2: 24 mmol/L (ref 22–32)
Chloride: 109 mmol/L (ref 101–111)
Creatinine, Ser: 0.85 mg/dL (ref 0.44–1.00)
GFR calc non Af Amer: >60 mL/min (ref >60)
Glucose, Bld: 90 mg/dL (ref 65–99)
Potassium: 3.7 mmol/L (ref 3.5–5.1)
SODIUM: 139 mmol/L (ref 135–145)
Total Bilirubin: 0.3 mg/dL (ref 0.3–1.2)
Total Protein: 7.2 g/dL (ref 6.5–8.1)

## 2016-07-14 LAB — URINALYSIS, ROUTINE W REFLEX MICROSCOPIC
Bacteria, UA: NONE SEEN
Bilirubin Urine: NEGATIVE
GLUCOSE, UA: NEGATIVE mg/dL
KETONES UR: NEGATIVE mg/dL
LEUKOCYTES UA: NEGATIVE
NITRITE: NEGATIVE
PROTEIN: NEGATIVE mg/dL
Specific Gravity, Urine: 1.021 (ref 1.005–1.030)
pH: 5 (ref 5.0–8.0)

## 2016-07-14 LAB — LIPASE, BLOOD: LIPASE: 17 U/L (ref 11–51)

## 2016-07-14 MED ORDER — SODIUM CHLORIDE 0.9 % IV BOLUS (SEPSIS)
1000.0000 mL | Freq: Once | INTRAVENOUS | Status: AC
  Administered 2016-07-14: 1000 mL via INTRAVENOUS

## 2016-07-14 NOTE — Discharge Instructions (Signed)
Continue taking your home medications as prescribed. I also recommend continuing to drink fluids at home to remain hydrated. I recommend calling your GI office tomorrow morning to discuss restarting your prescription of Humira versus waiting to follow up at your scheduled appointment next week. Please return to the Emergency Department if symptoms worsen or new onset of fever, chest pain, difficulty breathing, new/worsening abdominal pain, vomiting, unable to keep fluids down, blood in vomit or stool.

## 2016-07-14 NOTE — ED Triage Notes (Signed)
Patient states that she has Crohn's and thinks that stress has caused a flare up. Since Friday she has had diarrhea and states that she needs some IV fluids to help get her feeling better.

## 2016-07-14 NOTE — ED Provider Notes (Signed)
[facility] DEPT Provider Note   CSN: [id number] Arrival date & time: 07/14/16  1021     History   Chief Complaint Chief Complaint  Patient presents with  . Diarrhea    crohn's     HPI Sharon Clayton is a 26 y.o. female.  HPI   Patient is a 26 year old female with history of Crohn's disease presents the ED with complaint of Crohn's flare, onset 2 days. Patient reports yesterday she began having multiple episodes of nonbloody diarrhea which have continued today. She reports having 4 episodes yesterday and 2 this morning. Endorses associated waxing and waning cramping pain to the left side of her abdomen which is consistent with Crohn's flare she has had in the past. Patient denies taking any medications for her symptoms. She states she currently is not taking any steroids and has not been on Humira for the past 2-3 months. Patient reports she has been on a "fast" for the past month where she has been trying not to eat foods that aggravate her Crohn's however she notes she has had increased stress over the past few days which she thinks has caused her Crohn's flare. Denies fever, chills, chest pain, shortness of breath, nausea, vomiting, urinary symptoms, blood in urine or stool, vaginal bleeding, vaginal discharge. Patient reports she has a follow-up appointment with her GI doctor next week. Endorses abdominal surgical history 2 C-sections. Denies any recent hospitalizations or use of antibiotics. LMP 06/19/16.  PCP-Dr. Ayesha Rumpf GI- Dr. [name]  Past Medical History:  Diagnosis Date  . Bacterial infection 09/10/11  . Crohn's disease ([place])   . H/O varicella 09/10/11  . Hypotension   . IBS (irritable bowel syndrome)   . Low iron 09/10/11  . Trichomonas 09/10/10  . Yeast infection 09/10/11    Patient Active Problem List   Diagnosis Date Noted  . Crohn's disease with complication ([place]) 06/12/7251  . S/P cesarean section 09/04/2013  . GBS (group B Streptococcus carrier), +RV culture,  currently pregnant 06/12/2013  . Smoker 06/08/2013  . Hx of preeclampsia, prior pregnancy, currently pregnant 06/08/2013  . IBS (irritable bowel syndrome) 06/08/2013  . Uses marijuana 04/13/2013  . Previous cesarean delivery, antepartum condition or complication 66/44/0347    Past Surgical History:  Procedure Laterality Date  . CESAREAN SECTION  09/10/11   NRFHT - primary low transverse cesarean section   . CESAREAN SECTION N/A 09/04/2013   Procedure: CESAREAN SECTION- repeat;  Surgeon: Osborne Oman, MD;  Location: [facility];  Service: Obstetrics;  Laterality: N/A;    OB History    Gravida Para Term Preterm AB Living   4 2 2  0 1 2   SAB TAB Ectopic Multiple Live Births   1 0 0 0 2       Home Medications    Prior to Admission medications   Medication Sig Start Date End Date Taking? Authorizing Provider  cetirizine (ZYRTEC) 10 MG tablet Take 10 mg by mouth at bedtime as needed for allergies.    Yes Historical Provider, MD  dicyclomine (BENTYL) 20 MG tablet Take 1 tablet (20 mg total) by mouth 2 (two) times daily. Patient taking differently: Take 20 mg by mouth daily as needed for spasms.  02/08/16  Yes Ankit Nanavati, MD  diphenhydramine-acetaminophen (TYLENOL PM) 25-500 MG TABS tablet Take 1 tablet by mouth at bedtime as needed (for pain/sleep).   Yes Historical Provider, MD  Docosahexaenoic Acid (DHA PO) Take 1 capsule by mouth at bedtime.   Yes Historical Provider,  MD  fluticasone (FLONASE) 50 MCG/ACT nasal spray Place 2 sprays into both nostrils daily as needed for rhinitis.   Yes Historical Provider, MD  OVER THE COUNTER MEDICATION Take 1-2 capsules by mouth at bedtime. Medication:  Wheat grass   Yes Historical Provider, MD  Prenatal Vit-Fe Fumarate-FA (PRENATAL MULTIVITAMIN) TABS tablet Take 1 tablet by mouth at bedtime.   Yes Historical Provider, MD  sodium chloride (OCEAN) 0.65 % SOLN nasal spray Place 1 spray into both nostrils as needed for congestion.   Yes Historical  Provider, MD  cholestyramine (QUESTRAN) 4 g packet Take 1 packet (4 g total) by mouth 3 (three) times daily with meals. Patient not taking: Reported on 07/14/2016 02/08/16   Varney Biles, MD  predniSONE (DELTASONE) 10 MG tablet Take 6 tablets (60 mg total) by mouth daily. Patient not taking: Reported on 07/14/2016 02/08/16   Varney Biles, MD    Family History Family History  Problem Relation Age of Onset  . Hypertension Mother   . Hypertension Father   . Colon cancer Neg Hx     Social History Social History  Substance Use Topics  . Smoking status: Current Every Day Smoker    Packs/day: 0.10    Years: 8.00    Types: Cigarettes  . Smokeless tobacco: Never Used     Comment: e-cig  . Alcohol use No     Comment: occ     Allergies   Lactose intolerance (gi) and Flagyl [metronidazole]   Review of Systems Review of Systems  Gastrointestinal: Positive for abdominal pain and diarrhea.  All other systems reviewed and are negative.    Physical Exam Updated Vital Signs BP 108/66   Pulse 68   Temp 98.6 F (37 C) (Oral)   Resp 16   Ht 5' 1.5" (1.562 m)   LMP 06/19/2016   SpO2 100%   Physical Exam  Constitutional: She is oriented to person, place, and time. She appears well-developed and well-nourished. No distress.  HENT:  Head: Normocephalic and atraumatic.  Mouth/Throat: Uvula is midline and oropharynx is clear and moist. Mucous membranes are dry. No oropharyngeal exudate, posterior oropharyngeal edema, posterior oropharyngeal erythema or tonsillar abscesses. No tonsillar exudate.  Eyes: Conjunctivae and EOM are normal. Right eye exhibits no discharge. Left eye exhibits no discharge. No scleral icterus.  Neck: Normal range of motion. Neck supple.  Cardiovascular: Normal rate, regular rhythm, normal heart sounds and intact distal pulses.   Pulmonary/Chest: Effort normal and breath sounds normal. No respiratory distress. She has no wheezes. She has no rales. She exhibits no  tenderness.  Abdominal: Soft. Bowel sounds are normal. She exhibits no distension and no mass. There is tenderness. There is no rebound and no guarding. No hernia.  Mild diffuse abdominal tenderness  Musculoskeletal: Normal range of motion. She exhibits no edema.  Neurological: She is alert and oriented to person, place, and time.  Skin: Skin is warm and dry. She is not diaphoretic.  Nursing note and vitals reviewed.    ED Treatments / Results  Labs (all labs ordered are listed, but only abnormal results are displayed) Labs Reviewed  COMPREHENSIVE METABOLIC PANEL - Abnormal; Notable for the following:       Result Value   Albumin 3.4 (*)    All other components within normal limits  CBC - Abnormal; Notable for the following:    Hemoglobin 11.2 (*)    HCT 33.6 (*)    All other components within normal limits  URINALYSIS, ROUTINE W REFLEX  MICROSCOPIC - Abnormal; Notable for the following:    APPearance HAZY (*)    Hgb urine dipstick SMALL (*)    Squamous Epithelial / LPF 6-30 (*)    All other components within normal limits  LIPASE, BLOOD  I-STAT BETA HCG BLOOD, ED ([facility], [facility], [facility] ONLY)    EKG  EKG Interpretation None       Radiology No results found.  Procedures Procedures (including critical care time)  Medications Ordered in ED Medications  sodium chloride 0.9 % bolus 1,000 mL (0 mLs Intravenous Stopped 07/14/16 1349)     Initial Impression / Assessment and Plan / ED Course  I have reviewed the triage vital signs and the nursing notes.  Pertinent labs & imaging results that were available during my care of the patient were reviewed by me and considered in my medical decision making (see chart for details).     Patient presented with abdominal pain with associated nonbloody diarrhea which started 2 days ago. Reports symptoms are consistent with Crohn's flare she has had in the past. Denies fever. Patient currently denies being on Humira or prednisone for the past  2-3 months and states she has been fasting on fruits and veggies to help with her Crohn's. VSS. Exam revealed dry mucous membranes and mild diffuse abdominal tenderness, no peritoneal signs. Remaining exam unremarkable. Patient requesting IV fluids to help with her symptoms and declined any other medications. Labs and urine unremarkable. Pregnancy negative. On reevaluation patient reports mild improvement of her symptoms. Denies any episodes of vomiting or diarrhea while in the ED. Patient able to tolerate PO. Suspect pt's sxs are likely due to mild Crohn's flare, due to pt without significant exam findings or lab results I do not feel that any further workup or CT imaging is warranted at this time for further evaluation. Plan to discharge patient home with symptomatic treatment and advised to follow-up with her GI clinic tomorrow to discuss followup and tx of Crohn's including re-initiation of Humira or prednisone. Discussed strict return precautions.  Final Clinical Impressions(s) / ED Diagnoses   Final diagnoses:  Crohn's disease with complication, unspecified gastrointestinal tract location [facility])    New Prescriptions New Prescriptions   No medications on file     Nona Dell, [place] 07/14/16 1417    Dorie Rank, MD 07/15/16 1945

## 2016-07-14 NOTE — ED Notes (Signed)
Pt tolerating po fluids

## 2016-08-02 ENCOUNTER — Ambulatory Visit: Payer: Medicaid Other | Admitting: Gastroenterology

## 2016-09-29 ENCOUNTER — Encounter (HOSPITAL_COMMUNITY): Payer: Self-pay | Admitting: Emergency Medicine

## 2016-09-29 ENCOUNTER — Emergency Department (HOSPITAL_COMMUNITY)
Admission: EM | Admit: 2016-09-29 | Discharge: 2016-09-29 | Disposition: A | Discharge status: Home / Self Care | Payer: Medicaid Other | Attending: Emergency Medicine | Admitting: Emergency Medicine

## 2016-09-29 DIAGNOSIS — R197 Diarrhea, unspecified: Secondary | ICD-10-CM | POA: Diagnosis present

## 2016-09-29 DIAGNOSIS — F17210 Nicotine dependence, cigarettes, uncomplicated: Secondary | ICD-10-CM | POA: Diagnosis not present

## 2016-09-29 DIAGNOSIS — Z79899 Other long term (current) drug therapy: Secondary | ICD-10-CM | POA: Diagnosis not present

## 2016-09-29 LAB — COMPREHENSIVE METABOLIC PANEL
ALK PHOS: 102 U/L (ref 38–126)
ALT: 53 U/L (ref 14–54)
AST: 35 U/L (ref 15–41)
Albumin: 3.8 g/dL (ref 3.5–5.0)
Anion gap: 7 (ref 5–15)
BUN: 9 mg/dL (ref 6–20)
CHLORIDE: 105 mmol/L (ref 101–111)
CO2: 24 mmol/L (ref 22–32)
CREATININE: 0.9 mg/dL (ref 0.44–1.00)
Calcium: 9.3 mg/dL (ref 8.9–10.3)
GFR calc non Af Amer: >60 mL/min (ref >60)
GLUCOSE: 85 mg/dL (ref 65–99)
Potassium: 4.1 mmol/L (ref 3.5–5.1)
SODIUM: 136 mmol/L (ref 135–145)
Total Bilirubin: 0.4 mg/dL (ref 0.3–1.2)
Total Protein: 7.8 g/dL (ref 6.5–8.1)

## 2016-09-29 LAB — URINALYSIS, ROUTINE W REFLEX MICROSCOPIC
Bilirubin Urine: NEGATIVE
Glucose, UA: NEGATIVE mg/dL
Hgb urine dipstick: NEGATIVE
KETONES UR: NEGATIVE mg/dL
Leukocytes, UA: NEGATIVE
Nitrite: NEGATIVE
PROTEIN: NEGATIVE mg/dL
Specific Gravity, Urine: 1.012 (ref 1.005–1.030)
pH: 5 (ref 5.0–8.0)

## 2016-09-29 LAB — CBC
HCT: 35.9 % — ABNORMAL LOW (ref 36.0–46.0)
HEMOGLOBIN: 11.9 g/dL — AB (ref 12.0–15.0)
MCH: 27.4 pg (ref 26.0–34.0)
MCHC: 33.1 g/dL (ref 30.0–36.0)
MCV: 82.7 fL (ref 78.0–100.0)
Platelets: 336 10*3/uL (ref 150–400)
RBC: 4.34 MIL/uL (ref 3.87–5.11)
RDW: 14 % (ref 11.5–15.5)
WBC: 5.7 10*3/uL (ref 4.0–10.5)

## 2016-09-29 LAB — LIPASE, BLOOD: LIPASE: 19 U/L (ref 11–51)

## 2016-09-29 LAB — POC URINE PREG, ED: PREG TEST UR: NEGATIVE

## 2016-09-29 MED ORDER — SODIUM CHLORIDE 0.9 % IV BOLUS (SEPSIS)
1000.0000 mL | Freq: Once | INTRAVENOUS | Status: AC
  Administered 2016-09-29: 1000 mL via INTRAVENOUS
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
## 2016-09-29 NOTE — Discharge Instructions (Signed)
Follow-up with her primary care doctor as previously scheduled.  Make sure you're drinking plenty of fluids to stay adequately hydrated.  Make sure you're eating the appropriate foods for age and a flare of her Crohn's.  You can follow-up with the GI doctor that is provided if you do not wish to see yourGI doctor. Call them to arrange an appointment next 24-48 hours  [facility] is located at [address], [place], [place], [place] [address]. Hours: Monday thru Friday, 8:30am to 5:00pm. Call [phone] for more information  Return the emergency Department for any worsening diarrhea, fever, abdominal pain, inability to tolerate any food, persistent vomiting or any other worsening or concerning symptoms.

## 2016-09-29 NOTE — ED Triage Notes (Signed)
Pt hx of crohns reports having diarrhea for past week. Unable to keep fluids down. Reports an intermittent tingling feeling in her head, states she usually feels this way when she needs IV hydration.

## 2016-12-22 ENCOUNTER — Emergency Department (HOSPITAL_COMMUNITY)
Admission: EM | Admit: 2016-12-22 | Discharge: 2016-12-22 | Disposition: A | Discharge status: Home / Self Care | Payer: Medicaid Other | Attending: Emergency Medicine | Admitting: Emergency Medicine

## 2016-12-22 DIAGNOSIS — R197 Diarrhea, unspecified: Secondary | ICD-10-CM | POA: Diagnosis present

## 2016-12-22 DIAGNOSIS — Z79899 Other long term (current) drug therapy: Secondary | ICD-10-CM | POA: Insufficient documentation

## 2016-12-22 DIAGNOSIS — K5090 Crohn's disease, unspecified, without complications: Secondary | ICD-10-CM | POA: Insufficient documentation

## 2016-12-22 DIAGNOSIS — F17210 Nicotine dependence, cigarettes, uncomplicated: Secondary | ICD-10-CM | POA: Insufficient documentation

## 2016-12-22 LAB — URINALYSIS, ROUTINE W REFLEX MICROSCOPIC
Bilirubin Urine: NEGATIVE
Glucose, UA: NEGATIVE mg/dL
Hgb urine dipstick: NEGATIVE
Ketones, ur: NEGATIVE mg/dL
LEUKOCYTES UA: NEGATIVE
Nitrite: NEGATIVE
PROTEIN: NEGATIVE mg/dL
Specific Gravity, Urine: 1 — ABNORMAL LOW (ref 1.005–1.030)
pH: 7 (ref 5.0–8.0)

## 2016-12-22 LAB — COMPREHENSIVE METABOLIC PANEL
ALT: 32 U/L (ref 14–54)
ANION GAP: 9 (ref 5–15)
AST: 28 U/L (ref 15–41)
Albumin: 3.6 g/dL (ref 3.5–5.0)
Alkaline Phosphatase: 100 U/L (ref 38–126)
BUN: 7 mg/dL (ref 6–20)
CALCIUM: 9.4 mg/dL (ref 8.9–10.3)
CHLORIDE: 108 mmol/L (ref 101–111)
CO2: 19 mmol/L — ABNORMAL LOW (ref 22–32)
CREATININE: 1 mg/dL (ref 0.44–1.00)
Glucose, Bld: 81 mg/dL (ref 65–99)
Potassium: 3.7 mmol/L (ref 3.5–5.1)
Sodium: 136 mmol/L (ref 135–145)
Total Bilirubin: 0.5 mg/dL (ref 0.3–1.2)
Total Protein: 8.1 g/dL (ref 6.5–8.1)

## 2016-12-22 LAB — I-STAT BETA HCG BLOOD, ED (MC, WL, AP ONLY)

## 2016-12-22 LAB — CBC
HCT: 35.2 % — ABNORMAL LOW (ref 36.0–46.0)
HEMOGLOBIN: 11.8 g/dL — AB (ref 12.0–15.0)
MCH: 27.3 pg (ref 26.0–34.0)
MCHC: 33.5 g/dL (ref 30.0–36.0)
MCV: 81.5 fL (ref 78.0–100.0)
PLATELETS: 319 10*3/uL (ref 150–400)
RBC: 4.32 MIL/uL (ref 3.87–5.11)
RDW: 13.8 % (ref 11.5–15.5)
WBC: 5.6 10*3/uL (ref 4.0–10.5)

## 2016-12-22 LAB — LIPASE, BLOOD: LIPASE: 27 U/L (ref 11–51)

## 2016-12-22 LAB — I-STAT TROPONIN, ED: TROPONIN I, POC: 0 ng/mL (ref 0.00–0.08)

## 2016-12-22 MED ORDER — SODIUM CHLORIDE 0.9 % IV BOLUS (SEPSIS)
500.0000 mL | Freq: Once | INTRAVENOUS | Status: AC
  Administered 2016-12-22: 500 mL via INTRAVENOUS

## 2016-12-22 MED ORDER — SODIUM CHLORIDE 0.9 % IV BOLUS (SEPSIS)
1000.0000 mL | Freq: Once | INTRAVENOUS | Status: AC
  Administered 2016-12-22: 1000 mL via INTRAVENOUS

## 2016-12-22 NOTE — ED Provider Notes (Signed)
Sharon Clayton Provider Note   CSN: [id number] Arrival date & time: 12/22/16  1056     History   Chief Complaint Chief Complaint  Patient presents with  . Tachycardia  . Diarrhea    HPI Sharon Clayton is a 26 y.o. female.  The history is provided by the patient and medical records. No language interpreter was used.  Diarrhea   Pertinent negatives include no abdominal pain and no vomiting.   Sharon Clayton is a 25 y.o. female  with a PMH of Crohn's who presents to the Emergency Department complaining of Diarrhea which began last night. She notes 4 loose nonbloody stools since onset. She states this is consistent with her typical Crohn's flares which occurs when she gets "stressed out". She used to be followed by GI, however she no longer is, but instead followed by PCP. She states that she did not want to be on any immunosuppressants or steroids which is why she decided not to continue follow-up care with GI. She has changed her diet tremendously which she believes has helped with her Crohn's flares. She denies any abdominal pain,  nausea or vomiting. No sick contacts. No medications taken prior to arrival for symptoms. No alleviating or aggravating factors noted. This morning, she felt a pounding in her chest while having a bowel movement. The sensation lasted a few seconds and self resolved. She denies any chest pain, just a "thumping feeling". No shortness of breath. The feeling is now gone. No history of cardiac disease or similar sxs.   Past Medical History:  Diagnosis Date  . Bacterial infection 09/10/11  . Crohn's disease ([place])   . H/O varicella 09/10/11  . Hypotension   . IBS (irritable bowel syndrome)   . Low iron 09/10/11  . Trichomonas 09/10/10  . Yeast infection 09/10/11    Patient Active Problem List   Diagnosis Date Noted  . Crohn's disease with complication ([place]) 62/22/9798  . S/P cesarean section 09/04/2013  . GBS (group B Streptococcus carrier), +RV culture,  currently pregnant 06/12/2013  . Smoker 06/08/2013  . Hx of preeclampsia, prior pregnancy, currently pregnant 06/08/2013  . IBS (irritable bowel syndrome) 06/08/2013  . Uses marijuana 04/13/2013  . Previous cesarean delivery, antepartum condition or complication 92/04/9416    Past Surgical History:  Procedure Laterality Date  . CESAREAN SECTION  09/10/11   NRFHT - primary low transverse cesarean section   . CESAREAN SECTION N/A 09/04/2013   Procedure: CESAREAN SECTION- repeat;  Surgeon: Osborne Oman, MD;  Location: [facility];  Service: Obstetrics;  Laterality: N/A;    OB History    Gravida Para Term Preterm AB Living   4 2 2  0 1 2   SAB TAB Ectopic Multiple Live Births   1 0 0 0 2       Home Medications    Prior to Admission medications   Medication Sig Start Date End Date Taking? Authorizing Provider  alum & mag hydroxide-simeth (MAALOX/MYLANTA) 200-200-20 MG/5ML suspension Take 30 mLs by mouth every 6 (six) hours as needed for indigestion or heartburn.   Yes [provider]  diphenhydramine-acetaminophen (TYLENOL PM) 25-500 MG TABS tablet Take 1 tablet by mouth at bedtime as needed (for pain/sleep).   Yes [provider]  Docosahexaenoic Acid (DHA PO) Take 1 capsule by mouth at bedtime.   Yes [provider]  OVER THE COUNTER MEDICATION Take 1-2 capsules by mouth at bedtime. Medication:  Wheat grass   Yes [provider]  Prenatal Vit-Fe Fumarate-FA (PRENATAL MULTIVITAMIN) TABS tablet Take 1 tablet by mouth at bedtime.   Yes [provider]    Family History Family History  Problem Relation Age of Onset  . Hypertension Mother   . Hypertension Father   . Colon cancer Neg Hx     Social History Social History  Substance Use Topics  . Smoking status: Current Every Day Smoker    Packs/day: 0.10    Years: 8.00    Types: Cigarettes  . Smokeless tobacco: Never Used     Comment: e-cig  . Alcohol use No     Comment: occ      Allergies   Lactose intolerance (gi) and Flagyl [metronidazole]   Review of Systems Review of Systems  Cardiovascular: Positive for palpitations. Negative for chest pain and leg swelling.  Gastrointestinal: Positive for diarrhea. Negative for abdominal pain, blood in stool, constipation, nausea and vomiting.  All other systems reviewed and are negative.    Physical Exam Updated Vital Signs BP 113/85   Pulse 78   Temp 98.6 F (37 C) (Oral)   Ht 5' 2"  (1.575 m)   Wt 77.1 kg (170 lb)   SpO2 100%   BMI 31.09 kg/m   Physical Exam  Constitutional: She is oriented to person, place, and time. She appears well-developed and well-nourished. No distress.  Nontoxic appearing.  HENT:  Head: Normocephalic and atraumatic.  Cardiovascular: Normal rate, regular rhythm and normal heart sounds.   No murmur heard. Pulmonary/Chest: Effort normal and breath sounds normal. No respiratory distress.  Abdominal: Soft. Bowel sounds are normal. She exhibits no distension.  No abdominal tenderness.  Musculoskeletal: She exhibits no edema.  Neurological: She is alert and oriented to person, place, and time.  Skin: Skin is warm and dry.  Good cap refill.  Nursing note and vitals reviewed.    ED Treatments / Results  Labs (all labs ordered are listed, but only abnormal results are displayed) Labs Reviewed  COMPREHENSIVE METABOLIC PANEL - Abnormal; Notable for the following:       Result Value   CO2 19 (*)    All other components within normal limits  CBC - Abnormal; Notable for the following:    Hemoglobin 11.8 (*)    HCT 35.2 (*)    All other components within normal limits  URINALYSIS, ROUTINE W REFLEX MICROSCOPIC - Abnormal; Notable for the following:    Color, Urine COLORLESS (*)    Specific Gravity, Urine 1.000 (*)    All other components within normal limits  LIPASE, BLOOD  I-STAT TROPOININ, ED  I-STAT BETA HCG BLOOD, ED ([facility], [facility], [facility] ONLY)    EKG  EKG  Interpretation  Date/Time:  Sunday December 22 2016 11:08:22 EDT Ventricular Rate:  86 PR Interval:  168 QRS Duration: 84 QT Interval:  374 QTC Calculation: 447 R Axis:   78 Text Interpretation:  Normal sinus rhythm Normal ECG When compared to prior, no significant changes.  No STEMI Confirmed by Antony Blackbird [id number]) on 12/22/2016 12:21:20 PM       Radiology No results found.  Procedures Procedures (including critical care time)  Medications Ordered in ED Medications  sodium chloride 0.9 % bolus 1,000 mL (0 mLs Intravenous Stopped 12/22/16 1439)  sodium chloride 0.9 % bolus 500 mL (0 mLs Intravenous Stopped 12/22/16 1439)     Initial Impression / Assessment and Plan / ED Course  I have reviewed the triage vital signs and the nursing notes.  Pertinent labs & imaging  results that were available during my care of the patient were reviewed by me and considered in my medical decision making (see chart for details).    Sharon Clayton is a 26 y.o. female with hx of Crohn's who presents to ED for Diarrhea which began yesterday. She notes this is consistent with her typical Crohn's flares. She is currently not on immunosuppressants/steroids as she is trying to manage her Crohn's disease naturally. She has made several dietary / lifestyle changes which has helped with her flares. She would like to avoid any medications if possible. She denies any abdominal pain, n/v. Exam, patient is afebrile, hemodynamically stable and nontoxic appearing with no abdominal or CVA tenderness. Labs reassuring with normal electrolytes and white count. Lipase wdl, hCG negative and UA with no signs of infection. Patient also endorses palpitation sensation earlier today which has now resolved. Troponin negative and EKG unchanged from previous. Patient given IV hydration and reassess. On reevaluation, she continues to have a benign abdominal exam and feels very much improved. She states that she typically feels much  better after IV fluids and agrees to follow-up with her PCP. Discussed at length reasons to return to ER including fevers, abdominal pain, vomiting, new/worsening symptoms or any additional concerns. Patient and mother at bedside understand and agree with plan as dictated above. All questions answered.  Final Clinical Impressions(s) / ED Diagnoses   Final diagnoses:  Crohn's disease without complication, unspecified gastrointestinal tract location [facility])    New Prescriptions New Prescriptions   No medications on file     Jahrel Borthwick, Ozella Almond, PA-C 12/22/16 1455    Tegeler, Gwenyth Allegra, MD 12/23/16 1020

## 2016-12-22 NOTE — ED Triage Notes (Signed)
States has f elt her heart rate  Was high  About 15 miins ago and she had  diarrhea x 4 today,  Some sob ,no nausea / vomiting

## 2016-12-22 NOTE — Discharge Instructions (Signed)
It was my pleasure taking care of you today!   It was my pleasure taking care of you today!   Fortunately, your lab work  was very reassuring. It is VERY important that you monitor your symptoms and return to the Emergency Department if you develop any of the following symptoms:  Worsening abdominal pain You have a fever.  You keep throwing up and can't keep fluids down. You pass bloody or black tarry stools.  There is bright red blood in the stool. You do not seem to be getting better.  You have any new or worsening symptoms or additional concerns.

## 2017-05-21 ENCOUNTER — Encounter (HOSPITAL_COMMUNITY): Payer: Self-pay | Admitting: *Deleted

## 2017-05-21 ENCOUNTER — Emergency Department (HOSPITAL_COMMUNITY)
Admission: EM | Admit: 2017-05-21 | Discharge: 2017-05-21 | Disposition: A | Discharge status: Home / Self Care | Payer: Medicaid Other | Attending: Emergency Medicine | Admitting: Emergency Medicine

## 2017-05-21 DIAGNOSIS — K5090 Crohn's disease, unspecified, without complications: Secondary | ICD-10-CM | POA: Diagnosis not present

## 2017-05-21 DIAGNOSIS — R002 Palpitations: Secondary | ICD-10-CM | POA: Diagnosis present

## 2017-05-21 DIAGNOSIS — F17210 Nicotine dependence, cigarettes, uncomplicated: Secondary | ICD-10-CM | POA: Insufficient documentation

## 2017-05-21 LAB — LIPASE, BLOOD: Lipase: 17 U/L (ref 11–51)

## 2017-05-21 LAB — COMPREHENSIVE METABOLIC PANEL
ALK PHOS: 108 U/L (ref 38–126)
ALT: 13 U/L — AB (ref 14–54)
AST: 18 U/L (ref 15–41)
Albumin: 3.5 g/dL (ref 3.5–5.0)
Anion gap: 8 (ref 5–15)
BUN: 8 mg/dL (ref 6–20)
CALCIUM: 9.2 mg/dL (ref 8.9–10.3)
CO2: 25 mmol/L (ref 22–32)
CREATININE: 0.75 mg/dL (ref 0.44–1.00)
Chloride: 105 mmol/L (ref 101–111)
Glucose, Bld: 87 mg/dL (ref 65–99)
Potassium: 3.5 mmol/L (ref 3.5–5.1)
Sodium: 138 mmol/L (ref 135–145)
Total Bilirubin: 0.7 mg/dL (ref 0.3–1.2)
Total Protein: 7.9 g/dL (ref 6.5–8.1)

## 2017-05-21 LAB — URINALYSIS, ROUTINE W REFLEX MICROSCOPIC
BACTERIA UA: NONE SEEN
Glucose, UA: NEGATIVE mg/dL
HGB URINE DIPSTICK: NEGATIVE
Ketones, ur: 5 mg/dL — AB
Leukocytes, UA: NEGATIVE
NITRITE: NEGATIVE
PROTEIN: 30 mg/dL — AB
SPECIFIC GRAVITY, URINE: 1.03 (ref 1.005–1.030)
pH: 7 (ref 5.0–8.0)

## 2017-05-21 LAB — CBC
HCT: 32.7 % — ABNORMAL LOW (ref 36.0–46.0)
Hemoglobin: 10.4 g/dL — ABNORMAL LOW (ref 12.0–15.0)
MCH: 25.1 pg — AB (ref 26.0–34.0)
MCHC: 31.8 g/dL (ref 30.0–36.0)
MCV: 79 fL (ref 78.0–100.0)
PLATELETS: 389 10*3/uL (ref 150–400)
RBC: 4.14 MIL/uL (ref 3.87–5.11)
RDW: 14.7 % (ref 11.5–15.5)
WBC: 6.8 10*3/uL (ref 4.0–10.5)

## 2017-05-21 LAB — I-STAT BETA HCG BLOOD, ED (MC, WL, AP ONLY)

## 2017-05-21 MED ORDER — DICYCLOMINE HCL 20 MG PO TABS: 20.0000 mg | ORAL_TABLET | Freq: Two times a day (BID) | ORAL | 0 refills | Status: DC | PRN

## 2017-05-21 MED ORDER — ACETAMINOPHEN 500 MG PO TABS
ORAL_TABLET | ORAL | Status: AC
  Filled 2017-05-21: qty 1

## 2017-05-21 MED ORDER — KETAMINE HCL 50 MG/ML IJ SOLN: 4.0000 mg/kg | Freq: Once | INTRAMUSCULAR | Status: DC

## 2017-05-21 MED ORDER — ACETAMINOPHEN 500 MG PO TABS
1000.0000 mg | ORAL_TABLET | Freq: Once | ORAL | Status: AC
  Administered 2017-05-21: 1000 mg via ORAL
  Filled 2017-05-21: qty 2

## 2017-05-21 MED ORDER — DICYCLOMINE HCL 10 MG PO CAPS
10.0000 mg | ORAL_CAPSULE | Freq: Once | ORAL | Status: DC
  Filled 2017-05-21: qty 1

## 2017-05-21 MED ORDER — SODIUM CHLORIDE 0.9 % IV BOLUS (SEPSIS)
1000.0000 mL | Freq: Once | INTRAVENOUS | Status: AC
  Administered 2017-05-21: 1000 mL via INTRAVENOUS

## 2017-05-21 NOTE — ED Notes (Signed)
ED Provider at bedside. 

## 2017-05-21 NOTE — ED Notes (Signed)
Pt eating [employer] in lobby. She is tolerating PO without difficulty.

## 2017-05-21 NOTE — ED Triage Notes (Addendum)
Pt states she felt hot, heart racing, shortness of breath while standing in line at court today. Pt states she has a history of Chrohns and feels like she was going to have diarrhea. Pt states she has a history of anxiety.

## 2017-05-21 NOTE — Discharge Instructions (Signed)
Take tylenol for pain.   Take bentyl for cramps.   Stay hydrated.   See your GI doctor and primary care doctor  Return to ER if you have severe abdominal pain, vomiting, dehydration, chest pain, trouble breathing.

## 2017-05-21 NOTE — ED Provider Notes (Signed)
[facility] DEPT Provider Note   CSN: [id number] Arrival date & time: 05/21/17  1051     History   Chief Complaint Chief Complaint  Patient presents with  . Anxiety  . Shortness of Breath  . Diarrhea    HPI Sharon Clayton is a 26 y.o. female history of Crohn's disease (inactive per patient), IBS, presenting with anxiety, palpitations, abdominal cramps.  Patient states that she was in court earlier today and had sudden onset of palpitations and anxiety and subjective shortness of breath.  She then had some abdominal cramps and felt like she would have a diarrhea but did not.  She states that her shortness of breath has resolved. She felt nauseated but while waiting in triage, patient was able to keep down some [employer]. Patient had seen Dr. [name] a year ago and was told that her Crohn's wasn't active at that time. Denies bloody diarrhea. Denies fever or actual vomiting or urinary symptoms.   The history is provided by the patient.    Past Medical History:  Diagnosis Date  . Bacterial infection 09/10/11  . Crohn's disease ([place])   . H/O varicella 09/10/11  . Hypotension   . IBS (irritable bowel syndrome)   . Low iron 09/10/11  . Trichomonas 09/10/10  . Yeast infection 09/10/11    Patient Active Problem List   Diagnosis Date Noted  . Crohn's disease with complication ([place]) 26/37/8588  . S/P cesarean section 09/04/2013  . GBS (group B Streptococcus carrier), +RV culture, currently pregnant 06/12/2013  . Smoker 06/08/2013  . Hx of preeclampsia, prior pregnancy, currently pregnant 06/08/2013  . IBS (irritable bowel syndrome) 06/08/2013  . Uses marijuana 04/13/2013  . Previous cesarean delivery, antepartum condition or complication 50/27/7412    Past Surgical History:  Procedure Laterality Date  . CESAREAN SECTION  09/10/11   NRFHT - primary low transverse cesarean section   . CESAREAN SECTION N/A 09/04/2013   Procedure: CESAREAN SECTION- repeat;   Surgeon: Osborne Oman, MD;  Location: [facility];  Service: Obstetrics;  Laterality: N/A;    OB History    Gravida Para Term Preterm AB Living   4 2 2  0 1 2   SAB TAB Ectopic Multiple Live Births   1 0 0 0 2       Home Medications    Prior to Admission medications   Medication Sig Start Date End Date Taking? Authorizing Provider  alum & mag hydroxide-simeth (MAALOX/MYLANTA) 200-200-20 MG/5ML suspension Take 30 mLs by mouth every 6 (six) hours as needed for indigestion or heartburn.   Yes [provider]  cetirizine (ZYRTEC) 10 MG tablet Take 10 mg by mouth daily as needed for allergies.   Yes [provider]  diphenhydramine-acetaminophen (TYLENOL PM) 25-500 MG TABS tablet Take 1 tablet by mouth at bedtime as needed (for pain/sleep).   Yes [provider]  OVER THE COUNTER MEDICATION Take 1-2 capsules by mouth at bedtime. Medication:  Wheat grass   Yes [provider]  Docosahexaenoic Acid (DHA PO) Take 1 capsule by mouth at bedtime.    [provider]  Prenatal Vit-Fe Fumarate-FA (PRENATAL MULTIVITAMIN) TABS tablet Take 1 tablet by mouth at bedtime.    [provider]    Family History Family History  Problem Relation Age of Onset  . Hypertension Mother   . Hypertension Father   . Colon cancer Neg Hx     Social History Social History   Tobacco Use  . Smoking  status: Current Every Day Smoker    Packs/day: 0.10    Years: 8.00    Pack years: 0.80    Types: Cigarettes  . Smokeless tobacco: Never Used  . Tobacco comment: e-cig  Substance Use Topics  . Alcohol use: No    Alcohol/week: 0.0 oz    Comment: occ  . Drug use: Yes    Types: Marijuana    Comment: denies 02/03/14     Allergies   Lactose intolerance (gi) and Flagyl [metronidazole]   Review of Systems Review of Systems  Cardiovascular: Positive for palpitations.  Gastrointestinal: Positive for diarrhea.  All other systems reviewed and are  negative.    Physical Exam Updated Vital Signs BP 113/82   Pulse 67   Temp 98.9 F (37.2 C) (Oral)   Resp 16   Wt 69.2 kg (152 lb 8 oz)   LMP 05/02/2017   SpO2 100%   BMI 27.89 kg/m   Physical Exam  Constitutional: She is oriented to person, place, and time. She appears well-developed and well-nourished.  HENT:  Head: Normocephalic.  Mouth/Throat: Oropharynx is clear and moist.  Eyes: Pupils are equal, round, and reactive to light.  Neck: Normal range of motion.  Cardiovascular: Normal rate and normal heart sounds.  Pulmonary/Chest: Effort normal and breath sounds normal.  Abdominal: Soft. Bowel sounds are normal.  Musculoskeletal: Normal range of motion.       Right lower leg: Normal.       Left lower leg: Normal.  Neurological: She is alert and oriented to person, place, and time.  Skin: Skin is warm.  Psychiatric: She has a normal mood and affect.  Nursing note and vitals reviewed.    ED Treatments / Results  Labs (all labs ordered are listed, but only abnormal results are displayed) Labs Reviewed  COMPREHENSIVE METABOLIC PANEL - Abnormal; Notable for the following components:      Result Value   ALT 13 (*)    All other components within normal limits  CBC - Abnormal; Notable for the following components:   Hemoglobin 10.4 (*)    HCT 32.7 (*)    MCH 25.1 (*)    All other components within normal limits  URINALYSIS, ROUTINE W REFLEX MICROSCOPIC - Abnormal; Notable for the following components:   Bilirubin Urine SMALL (*)    Ketones, ur 5 (*)    Protein, ur 30 (*)    Squamous Epithelial / LPF 6-30 (*)    All other components within normal limits  LIPASE, BLOOD  I-STAT BETA HCG BLOOD, ED ([facility], [facility], [facility] ONLY)    EKG  EKG Interpretation None       Radiology No results found.  Procedures Procedures (including critical care time)  Medications Ordered in ED Medications  dicyclomine (BENTYL) capsule 10 mg (10 mg Oral Refused 05/21/17 1916)  sodium  chloride 0.9 % bolus 1,000 mL (1,000 mLs Intravenous New Bag/Given 05/21/17 1916)     Initial Impression / Assessment and Plan / ED Course  I have reviewed the triage vital signs and the nursing notes.  Pertinent labs & imaging results that were available during my care of the patient were reviewed by me and considered in my medical decision making (see chart for details).     Sharon Clayton is a 26 y.o. female here with anxiety, palpitations while at court. Appears well. Abdomen nontender. Not tachycardic and I doubt PE or ACS. Likely anxiety. She has Crohn's disease but has no fever or vomiting or  blood in stool. She has follow up with GI. Will check labs, UA, UCG. Will hydrate and reassess.   8:10 PM Labs unremarkable. HCG neg. Given IVF and tolerated PO in the ED. Requesting bentyl prn cramps. Will dc home.    Final Clinical Impressions(s) / ED Diagnoses   Final diagnoses:  None    ED Discharge Orders    None       Drenda Freeze, MD 05/21/17 2011

## 2017-08-07 ENCOUNTER — Other Ambulatory Visit: Payer: Self-pay

## 2017-08-07 ENCOUNTER — Emergency Department (HOSPITAL_COMMUNITY)
Admission: EM | Admit: 2017-08-07 | Discharge: 2017-08-07 | Disposition: A | Discharge status: Home / Self Care | Payer: Medicaid Other | Attending: Emergency Medicine | Admitting: Emergency Medicine

## 2017-08-07 ENCOUNTER — Encounter (HOSPITAL_COMMUNITY): Payer: Self-pay | Admitting: Emergency Medicine

## 2017-08-07 DIAGNOSIS — R112 Nausea with vomiting, unspecified: Secondary | ICD-10-CM | POA: Diagnosis present

## 2017-08-07 DIAGNOSIS — R197 Diarrhea, unspecified: Secondary | ICD-10-CM | POA: Diagnosis not present

## 2017-08-07 DIAGNOSIS — Z79899 Other long term (current) drug therapy: Secondary | ICD-10-CM | POA: Insufficient documentation

## 2017-08-07 DIAGNOSIS — F17210 Nicotine dependence, cigarettes, uncomplicated: Secondary | ICD-10-CM | POA: Insufficient documentation

## 2017-08-07 LAB — COMPREHENSIVE METABOLIC PANEL
ALT: 14 U/L (ref 14–54)
ANION GAP: 14 (ref 5–15)
AST: 20 U/L (ref 15–41)
Albumin: 3.6 g/dL (ref 3.5–5.0)
Alkaline Phosphatase: 110 U/L (ref 38–126)
BUN: 8 mg/dL (ref 6–20)
CALCIUM: 9.4 mg/dL (ref 8.9–10.3)
CO2: 19 mmol/L — ABNORMAL LOW (ref 22–32)
CREATININE: 0.86 mg/dL (ref 0.44–1.00)
Chloride: 105 mmol/L (ref 101–111)
GFR calc non Af Amer: >60 mL/min (ref >60)
GLUCOSE: 96 mg/dL (ref 65–99)
Potassium: 3.4 mmol/L — ABNORMAL LOW (ref 3.5–5.1)
SODIUM: 138 mmol/L (ref 135–145)
TOTAL PROTEIN: 8.3 g/dL — AB (ref 6.5–8.1)
Total Bilirubin: 0.5 mg/dL (ref 0.3–1.2)

## 2017-08-07 LAB — URINALYSIS, ROUTINE W REFLEX MICROSCOPIC
BILIRUBIN URINE: NEGATIVE
Glucose, UA: NEGATIVE mg/dL
Hgb urine dipstick: NEGATIVE
KETONES UR: 15 mg/dL — AB
LEUKOCYTES UA: NEGATIVE
NITRITE: NEGATIVE
PH: 6 (ref 5.0–8.0)
PROTEIN: NEGATIVE mg/dL
Specific Gravity, Urine: >1.03 — ABNORMAL HIGH (ref 1.005–1.030)

## 2017-08-07 LAB — CBC
HCT: 36.8 % (ref 36.0–46.0)
Hemoglobin: 11.8 g/dL — ABNORMAL LOW (ref 12.0–15.0)
MCH: 24.6 pg — ABNORMAL LOW (ref 26.0–34.0)
MCHC: 32.1 g/dL (ref 30.0–36.0)
MCV: 76.7 fL — ABNORMAL LOW (ref 78.0–100.0)
PLATELETS: 443 10*3/uL — AB (ref 150–400)
RBC: 4.8 MIL/uL (ref 3.87–5.11)
RDW: 14.8 % (ref 11.5–15.5)
WBC: 9.2 10*3/uL (ref 4.0–10.5)

## 2017-08-07 LAB — I-STAT BETA HCG BLOOD, ED (MC, WL, AP ONLY): I-stat hCG, quantitative: <5 m[IU]/mL (ref <5)

## 2017-08-07 LAB — LIPASE, BLOOD: LIPASE: 28 U/L (ref 11–51)

## 2017-08-07 MED ORDER — LOPERAMIDE HCL 2 MG PO CAPS: 2.0000 mg | ORAL_CAPSULE | Freq: Four times a day (QID) | ORAL | 0 refills | Status: DC | PRN

## 2017-08-07 MED ORDER — ONDANSETRON HCL 4 MG/2ML IJ SOLN
4.0000 mg | Freq: Once | INTRAMUSCULAR | Status: AC
  Administered 2017-08-07: 4 mg via INTRAVENOUS
  Filled 2017-08-07: qty 2

## 2017-08-07 MED ORDER — FAMOTIDINE 20 MG PO TABS: 20.0000 mg | ORAL_TABLET | Freq: Two times a day (BID) | ORAL | 0 refills | Status: DC

## 2017-08-07 MED ORDER — FAMOTIDINE IN NACL 20-0.9 MG/50ML-% IV SOLN
20.0000 mg | Freq: Once | INTRAVENOUS | Status: AC
  Administered 2017-08-07: 20 mg via INTRAVENOUS
  Filled 2017-08-07: qty 50

## 2017-08-07 MED ORDER — GI COCKTAIL ~~LOC~~
30.0000 mL | Freq: Once | ORAL | Status: AC
  Administered 2017-08-07: 30 mL via ORAL
  Filled 2017-08-07: qty 30

## 2017-08-07 MED ORDER — DICYCLOMINE HCL 10 MG PO CAPS
10.0000 mg | ORAL_CAPSULE | Freq: Once | ORAL | Status: AC
  Administered 2017-08-07: 10 mg via ORAL
  Filled 2017-08-07: qty 1

## 2017-08-07 MED ORDER — DICYCLOMINE HCL 10 MG PO CAPS: 20.0000 mg | ORAL_CAPSULE | Freq: Four times a day (QID) | ORAL | 0 refills | Status: DC | PRN

## 2017-08-07 MED ORDER — SODIUM CHLORIDE 0.9 % IV BOLUS (SEPSIS)
1000.0000 mL | Freq: Once | INTRAVENOUS | Status: AC
  Administered 2017-08-07: 1000 mL via INTRAVENOUS

## 2017-08-07 MED ORDER — ONDANSETRON 4 MG PO TBDP: ORAL_TABLET | ORAL | 0 refills | Status: DC

## 2017-08-07 NOTE — ED Triage Notes (Signed)
Pt c/o 8/10 abd pain with nausea and vomiting for the past 2 days. No fever or chills.

## 2017-08-07 NOTE — Discharge Instructions (Signed)
Push fluids: take small frequent sips of water or Gatorade, do not drink any soda, juice or caffeinated beverages.    Slowly resume solid diet as desired. Avoid food that are spicy, contain dairy and/or have high fat content.  Aviod NSAIDs (aspirin, motrin, ibuprofen, naproxen, Aleve et Ronney Asters) for pain control because they will irritate your stomach.  Please follow with your primary care doctor in the next 2 days for a check-up. They must obtain records for further management.   Do not hesitate to return to the Emergency Department for any new, worsening or concerning symptoms.

## 2017-08-07 NOTE — ED Provider Notes (Signed)
[facility]ERGENCY DEPARTMENT Provider Note   CSN: [id number] Arrival date & time: 08/07/17  0321     History   Chief Complaint Chief Complaint  Patient presents with  . Abdominal Pain    HPI   Blood pressure 120/80, pulse 84, temperature 98.2 F (36.8 C), temperature source Oral, resp. rate 19, height 5' 2"  (1.575 m), weight 68.9 kg (152 lb), last menstrual period 07/20/2017, SpO2 100 %.  Sharon Clayton is a 27 y.o. female with past medical history significant for IBS and Crohn's disease complaining of nausea, vomiting, diarrhea onset 5 days ago, she states that the vomiting his past but she has persistent nonbloody, non-melanotic diarrhea.  She denies fever, chills, sick contacts.  She has left upper quadrant abdominal discomfort and esophageal burning.  She has been taking Zofran ODT with little relief.   Past Medical History:  Diagnosis Date  . Bacterial infection 09/10/11  . Crohn's disease ([place])   . H/O varicella 09/10/11  . Hypotension   . IBS (irritable bowel syndrome)   . Low iron 09/10/11  . Trichomonas 09/10/10  . Yeast infection 09/10/11    Patient Active Problem List   Diagnosis Date Noted  . Crohn's disease with complication ([place]) 28/78/6767  . S/P cesarean section 09/04/2013  . GBS (group B Streptococcus carrier), +RV culture, currently pregnant 06/12/2013  . Smoker 06/08/2013  . Hx of preeclampsia, prior pregnancy, currently pregnant 06/08/2013  . IBS (irritable bowel syndrome) 06/08/2013  . Uses marijuana 04/13/2013  . Previous cesarean delivery, antepartum condition or complication 20/94/7096    Past Surgical History:  Procedure Laterality Date  . CESAREAN SECTION  09/10/11   NRFHT - primary low transverse cesarean section   . CESAREAN SECTION N/A 09/04/2013   Procedure: CESAREAN SECTION- repeat;  Surgeon: Osborne Oman, MD;  Location: [facility];  Service: Obstetrics;  Laterality: N/A;    OB History    Gravida Para Term Preterm AB  Living   4 2 2  0 1 2   SAB TAB Ectopic Multiple Live Births   1 0 0 0 2       Home Medications    Prior to Admission medications   Medication Sig Start Date End Date Taking? Authorizing Provider  cetirizine (ZYRTEC) 10 MG tablet Take 10 mg by mouth daily as needed for allergies.   Yes [provider]  diphenhydramine-acetaminophen (TYLENOL PM) 25-500 MG TABS tablet Take 1 tablet by mouth at bedtime as needed (for pain/sleep).   Yes [provider]  ibuprofen (ADVIL,MOTRIN) 200 MG tablet Take 200 mg by mouth every 6 (six) hours as needed for moderate pain.   Yes [provider]  Simethicone (PHAZYME MAXIMUM STRENGTH) 250 MG CAPS Take 250 mg by mouth daily as needed (gas).   Yes [provider]  dicyclomine (BENTYL) 10 MG capsule Take 2 capsules (20 mg total) by mouth 4 (four) times daily as needed for spasms. 08/07/17   Denae Zulueta, Elmyra Ricks, PA-C  famotidine (PEPCID) 20 MG tablet Take 1 tablet (20 mg total) by mouth 2 (two) times daily. 08/07/17   Kage Willmann, Elmyra Ricks, PA-C  loperamide (IMODIUM) 2 MG capsule Take 1 capsule (2 mg total) by mouth 4 (four) times daily as needed for diarrhea or loose stools. 08/07/17   Damya Comley, Elmyra Ricks, PA-C  ondansetron (ZOFRAN ODT) 4 MG disintegrating tablet 20m ODT q4 hours prn nausea/vomit 08/07/17   Niomi Valent, NElmyra Ricks PA-C    Family History Family History  Problem Relation Age of Onset  .  Hypertension Mother   . Hypertension Father   . Colon cancer Neg Hx     Social History Social History   Tobacco Use  . Smoking status: Current Every Day Smoker    Packs/day: 0.10    Years: 8.00    Pack years: 0.80    Types: Cigarettes  . Smokeless tobacco: Never Used  . Tobacco comment: e-cig  Substance Use Topics  . Alcohol use: No    Alcohol/week: 0.0 oz    Comment: occ  . Drug use: Yes    Types: Marijuana    Comment: denies 02/03/14     Allergies   Lactose intolerance (gi) and Flagyl [metronidazole]   Review of  Systems Review of Systems  A complete review of systems was obtained and all systems are negative except as noted in the HPI and PMH.   Physical Exam Updated Vital Signs BP 119/85   Pulse 73   Temp 98.2 F (36.8 C) (Oral)   Resp 19   Ht 5' 2"  (1.575 m)   Wt 68.9 kg (152 lb)   LMP 07/20/2017   SpO2 100%   BMI 27.80 kg/m   Physical Exam  Constitutional: She is oriented to person, place, and time. She appears well-developed and well-nourished. No distress.  HENT:  Head: Normocephalic and atraumatic.  Mouth/Throat: Oropharynx is clear and moist.  Eyes: Conjunctivae and EOM are normal. Pupils are equal, round, and reactive to light.  Neck: Normal range of motion.  Cardiovascular: Normal rate, regular rhythm and intact distal pulses.  Pulmonary/Chest: Effort normal and breath sounds normal.  Abdominal: Soft. She exhibits no distension and no mass. There is no tenderness. There is no rebound and no guarding. No hernia.  Musculoskeletal: Normal range of motion.  Neurological: She is alert and oriented to person, place, and time.  Skin: She is not diaphoretic.  Psychiatric: She has a normal mood and affect.  Nursing note and vitals reviewed.    ED Treatments / Results  Labs (all labs ordered are listed, but only abnormal results are displayed) Labs Reviewed  COMPREHENSIVE METABOLIC PANEL - Abnormal; Notable for the following components:      Result Value   Potassium 3.4 (*)    CO2 19 (*)    Total Protein 8.3 (*)    All other components within normal limits  CBC - Abnormal; Notable for the following components:   Hemoglobin 11.8 (*)    MCV 76.7 (*)    MCH 24.6 (*)    Platelets 443 (*)    All other components within normal limits  URINALYSIS, ROUTINE W REFLEX MICROSCOPIC - Abnormal; Notable for the following components:   APPearance CLOUDY (*)    Specific Gravity, Urine >1.030 (*)    Ketones, ur 15 (*)    All other components within normal limits  LIPASE, BLOOD  I-STAT  BETA HCG BLOOD, ED ([facility], [facility], [facility] ONLY)    EKG  EKG Interpretation None       Radiology No results found.  Procedures Procedures (including critical care time)  Medications Ordered in ED Medications  sodium chloride 0.9 % bolus 1,000 mL (1,000 mLs Intravenous New Bag/Given 08/07/17 0707)  famotidine (PEPCID) IVPB 20 mg premix (0 mg Intravenous Stopped 08/07/17 0733)  ondansetron (ZOFRAN) injection 4 mg (4 mg Intravenous Given 08/07/17 0707)  dicyclomine (BENTYL) capsule 10 mg (10 mg Oral Given 08/07/17 0706)  gi cocktail (Maalox,Lidocaine,Donnatal) (30 mLs Oral Given 08/07/17 0706)     Initial Impression / Assessment and Plan /  ED Course  I have reviewed the triage vital signs and the nursing notes.  Pertinent labs & imaging results that were available during my care of the patient were reviewed by me and considered in my medical decision making (see chart for details).     Vitals:   08/07/17 0500 08/07/17 0515 08/07/17 0715 08/07/17 0730  BP: 120/80 108/83 116/84 119/85  Pulse: 84 81 69 73  Resp:      Temp:      TempSrc:      SpO2: 100% 100% 100% 100%  Weight:      Height:        Medications  sodium chloride 0.9 % bolus 1,000 mL (1,000 mLs Intravenous New Bag/Given 08/07/17 0707)  famotidine (PEPCID) IVPB 20 mg premix (0 mg Intravenous Stopped 08/07/17 0733)  ondansetron (ZOFRAN) injection 4 mg (4 mg Intravenous Given 08/07/17 0707)  dicyclomine (BENTYL) capsule 10 mg (10 mg Oral Given 08/07/17 0706)  gi cocktail (Maalox,Lidocaine,Donnatal) (30 mLs Oral Given 08/07/17 0706)    Sharon Clayton is 27 y.o. female presenting with resolved vomiting with persistent nausea, diarrhea, diffuse abdominal aching and esophageal discomfort.  Abdominal exam is benign, vital signs reassuring.  Blood work without significant abnormality.  IV fluids at patient and her mother   request.  Patient is tolerating p.o.'s, repeat abdominal exam is benign.  She states she feels significantly  improved after fluids.  Evaluation does not show pathology that would require ongoing emergent intervention or inpatient treatment. Pt is hemodynamically stable and mentating appropriately. Discussed findings and plan with patient/guardian, who agrees with care plan. All questions answered. Return precautions discussed and outpatient follow up given.    Final Clinical Impressions(s) / ED Diagnoses   Final diagnoses:  Nausea vomiting and diarrhea    ED Discharge Orders        Ordered    dicyclomine (BENTYL) 10 MG capsule  4 times daily PRN     08/07/17 0822    famotidine (PEPCID) 20 MG tablet  2 times daily     08/07/17 0822    loperamide (IMODIUM) 2 MG capsule  4 times daily PRN     08/07/17 0822    ondansetron (ZOFRAN ODT) 4 MG disintegrating tablet     08/07/17 0822       Ashira Kirsten, Charna Elizabeth 08/07/17 1470    Varney Biles, MD 08/07/17 [phone]

## 2017-08-07 NOTE — ED Notes (Signed)
Pt ambulated to and from restroom without difficulty.

## 2018-03-31 ENCOUNTER — Other Ambulatory Visit: Payer: Self-pay

## 2018-03-31 ENCOUNTER — Emergency Department (HOSPITAL_COMMUNITY)
Admission: EM | Admit: 2018-03-31 | Discharge: 2018-03-31 | Disposition: A | Discharge status: Home / Self Care | Payer: Medicaid Other | Attending: Emergency Medicine | Admitting: Emergency Medicine

## 2018-03-31 ENCOUNTER — Emergency Department (HOSPITAL_COMMUNITY): Payer: Medicaid Other

## 2018-03-31 ENCOUNTER — Encounter (HOSPITAL_COMMUNITY): Payer: Self-pay

## 2018-03-31 DIAGNOSIS — F17210 Nicotine dependence, cigarettes, uncomplicated: Secondary | ICD-10-CM | POA: Insufficient documentation

## 2018-03-31 DIAGNOSIS — K5010 Crohn's disease of large intestine without complications: Secondary | ICD-10-CM | POA: Diagnosis not present

## 2018-03-31 DIAGNOSIS — R197 Diarrhea, unspecified: Secondary | ICD-10-CM | POA: Diagnosis present

## 2018-03-31 DIAGNOSIS — R1010 Upper abdominal pain, unspecified: Secondary | ICD-10-CM

## 2018-03-31 LAB — URINALYSIS, ROUTINE W REFLEX MICROSCOPIC
Bacteria, UA: NONE SEEN
Bilirubin Urine: NEGATIVE
Glucose, UA: NEGATIVE mg/dL
Ketones, ur: NEGATIVE mg/dL
Leukocytes, UA: NEGATIVE
Nitrite: NEGATIVE
Protein, ur: NEGATIVE mg/dL
Specific Gravity, Urine: 1.001 — ABNORMAL LOW (ref 1.005–1.030)
pH: 6 (ref 5.0–8.0)

## 2018-03-31 LAB — COMPREHENSIVE METABOLIC PANEL
ALT: 14 U/L (ref 0–44)
AST: 16 U/L (ref 15–41)
Albumin: 3.5 g/dL (ref 3.5–5.0)
Alkaline Phosphatase: 95 U/L (ref 38–126)
Anion gap: 6 (ref 5–15)
BUN: 7 mg/dL (ref 6–20)
CO2: 25 mmol/L (ref 22–32)
Calcium: 8.9 mg/dL (ref 8.9–10.3)
Chloride: 106 mmol/L (ref 98–111)
Creatinine, Ser: 0.8 mg/dL (ref 0.44–1.00)
GFR calc Af Amer: >60 mL/min (ref >60)
GFR calc non Af Amer: >60 mL/min (ref >60)
Glucose, Bld: 78 mg/dL (ref 70–99)
Potassium: 3.8 mmol/L (ref 3.5–5.1)
Sodium: 137 mmol/L (ref 135–145)
Total Bilirubin: 0.4 mg/dL (ref 0.3–1.2)
Total Protein: 7.9 g/dL (ref 6.5–8.1)

## 2018-03-31 LAB — CBC
HCT: 36.1 % (ref 36.0–46.0)
Hemoglobin: 11.1 g/dL — ABNORMAL LOW (ref 12.0–15.0)
MCH: 24.5 pg — ABNORMAL LOW (ref 26.0–34.0)
MCHC: 30.7 g/dL (ref 30.0–36.0)
MCV: 79.7 fL — ABNORMAL LOW (ref 80.0–100.0)
Platelets: 453 10*3/uL — ABNORMAL HIGH (ref 150–400)
RBC: 4.53 MIL/uL (ref 3.87–5.11)
RDW: 15.2 % (ref 11.5–15.5)
WBC: 5.2 10*3/uL (ref 4.0–10.5)
nRBC: 0 % (ref 0.0–0.2)

## 2018-03-31 LAB — LIPASE, BLOOD: Lipase: 25 U/L (ref 11–51)

## 2018-03-31 LAB — I-STAT BETA HCG BLOOD, ED (MC, WL, AP ONLY): I-stat hCG, quantitative: <5 m[IU]/mL (ref <5)

## 2018-03-31 MED ORDER — ONDANSETRON 4 MG PO TBDP: 4.0000 mg | ORAL_TABLET | Freq: Three times a day (TID) | ORAL | 0 refills | Status: DC | PRN

## 2018-03-31 MED ORDER — IOHEXOL 300 MG/ML  SOLN
30.0000 mL | Freq: Once | INTRAMUSCULAR | Status: AC | PRN
  Administered 2018-03-31: 30 mL via ORAL

## 2018-03-31 MED ORDER — MORPHINE SULFATE (PF) 4 MG/ML IV SOLN
4.0000 mg | Freq: Once | INTRAVENOUS | Status: AC
  Administered 2018-03-31: 4 mg via INTRAVENOUS
  Filled 2018-03-31: qty 1

## 2018-03-31 MED ORDER — PREDNISONE 20 MG PO TABS: 40.0000 mg | ORAL_TABLET | Freq: Every day | ORAL | 0 refills | Status: AC

## 2018-03-31 MED ORDER — ALUM & MAG HYDROXIDE-SIMETH 200-200-20 MG/5ML PO SUSP
30.0000 mL | Freq: Once | ORAL | Status: AC
  Administered 2018-03-31: 30 mL via ORAL
  Filled 2018-03-31: qty 30

## 2018-03-31 MED ORDER — FAMOTIDINE 20 MG PO TABS: 20.0000 mg | ORAL_TABLET | Freq: Two times a day (BID) | ORAL | 0 refills | Status: DC

## 2018-03-31 MED ORDER — IOPAMIDOL (ISOVUE-300) INJECTION 61%
100.0000 mL | Freq: Once | INTRAVENOUS | Status: AC | PRN
  Administered 2018-03-31: 100 mL via INTRAVENOUS

## 2018-03-31 MED ORDER — SODIUM CHLORIDE 0.9 % IV BOLUS
1000.0000 mL | Freq: Once | INTRAVENOUS | Status: AC
  Administered 2018-03-31: 1000 mL via INTRAVENOUS

## 2018-03-31 MED ORDER — IOPAMIDOL (ISOVUE-300) INJECTION 61%
INTRAVENOUS | Status: AC
  Filled 2018-03-31: qty 100

## 2018-03-31 MED ORDER — LIDOCAINE VISCOUS HCL 2 % MT SOLN
15.0000 mL | Freq: Once | OROMUCOSAL | Status: AC
  Administered 2018-03-31: 15 mL via ORAL
  Filled 2018-03-31: qty 15

## 2018-03-31 MED ORDER — ONDANSETRON HCL 4 MG/2ML IJ SOLN
4.0000 mg | Freq: Once | INTRAMUSCULAR | Status: AC
  Administered 2018-03-31: 4 mg via INTRAVENOUS
  Filled 2018-03-31: qty 2

## 2018-03-31 NOTE — ED Provider Notes (Signed)
[facility] DEPT Provider Note   CSN: [id number] Arrival date & time: 03/31/18  1047  History   Chief Complaint Chief Complaint  Patient presents with  . Diarrhea    HPI Sharon Clayton is a 27 y.o. female the past medical history significant for Crohn's and IBS-D who presents for valuation of diarrhea and abdominal pain.  States she has had abdominal pain located to the umbilicus for approximately 2 weeks.  Pain has been gradually increasing.  Pain is rated 8/10.  Describes her pain as a cramping and dull aching pain.  Patient states this does not feel of her typical Crohn's flare pain.  Patient states she has been nauseous and has had multiple episodes of large-volume liquid diarrhea.  Denies blood or melena in her stool.  Denies aggravating or alleviating factors. Denies fever, chills, emesis, chest pain, SOB, reflux symptoms, dysuria, vaginal discharge, constipation, dizziness or lightheadedness.  States she has lost approximately 5 pounds in the last month. Has been taking Zofran ODT for her nausea with relief of symptoms. States she does not see GI anymore for her Crohn's disease. Was previously seeing [facility].  Denies recent travel, sick contacts, recent antibiotic use.  Her surgical history includes cesarean section.  HPI  Past Medical History:  Diagnosis Date  . Bacterial infection 09/10/11  . Crohn's disease ([place])   . H/O varicella 09/10/11  . Hypotension   . IBS (irritable bowel syndrome)   . Low iron 09/10/11  . Trichomonas 09/10/10  . Yeast infection 09/10/11    Patient Active Problem List   Diagnosis Date Noted  . Crohn's disease with complication ([place]) 27/08/5007  . S/P cesarean section 09/04/2013  . GBS (group B Streptococcus carrier), +RV culture, currently pregnant 06/12/2013  . Smoker 06/08/2013  . Hx of preeclampsia, prior pregnancy, currently pregnant 06/08/2013  . IBS (irritable bowel syndrome) 06/08/2013  . Uses marijuana 04/13/2013   . Previous cesarean delivery, antepartum condition or complication 38/18/2993    Past Surgical History:  Procedure Laterality Date  . CESAREAN SECTION  09/10/11   NRFHT - primary low transverse cesarean section   . CESAREAN SECTION N/A 09/04/2013   Procedure: CESAREAN SECTION- repeat;  Surgeon: Osborne Oman, MD;  Location: [facility];  Service: Obstetrics;  Laterality: N/A;     OB History    Gravida  4   Para  2   Term  2   Preterm  0   AB  1   Living  2     SAB  1   TAB  0   Ectopic  0   Multiple  0   Live Births  2            Home Medications    Prior to Admission medications   Medication Sig Start Date End Date Taking? Authorizing Provider  acidophilus (RISAQUAD) CAPS capsule Take 1 capsule by mouth daily.   Yes [provider]  cetirizine (ZYRTEC) 10 MG tablet Take 10 mg by mouth daily as needed for allergies.   Yes [provider]  dicyclomine (BENTYL) 10 MG capsule Take 2 capsules (20 mg total) by mouth 4 (four) times daily as needed for spasms. 08/07/17  Yes Pisciotta, Elmyra Ricks, PA-C  diphenhydramine-acetaminophen (TYLENOL PM) 25-500 MG TABS tablet Take 1 tablet by mouth at bedtime as needed (for pain/sleep).   Yes [provider]  ibuprofen (ADVIL,MOTRIN) 200 MG tablet Take 200 mg by mouth every 6 (six) hours as needed for  moderate pain.   Yes [provider]  loperamide (IMODIUM) 2 MG capsule Take 1 capsule (2 mg total) by mouth 4 (four) times daily as needed for diarrhea or loose stools. 08/07/17  Yes Pisciotta, Elmyra Ricks, PA-C  Simethicone (PHAZYME MAXIMUM STRENGTH) 250 MG CAPS Take 250 mg by mouth daily as needed (gas).   Yes [provider]  famotidine (PEPCID) 20 MG tablet Take 1 tablet (20 mg total) by mouth 2 (two) times daily. 03/31/18   Khali Perella A, PA-C  ondansetron (ZOFRAN ODT) 4 MG disintegrating tablet Take 1 tablet (4 mg total) by mouth every 8 (eight) hours as needed for nausea or vomiting.  03/31/18   Zamaria Brazzle A, PA-C  predniSONE (DELTASONE) 20 MG tablet Take 2 tablets (40 mg total) by mouth daily for 5 days. 03/31/18 04/05/18  Severa Jeremiah A, PA-C    Family History Family History  Problem Relation Age of Onset  . Hypertension Mother   . Hypertension Father   . Colon cancer Neg Hx     Social History Social History   Tobacco Use  . Smoking status: Current Every Day Smoker    Packs/day: 1.00    Years: 8.00    Pack years: 8.00    Types: Cigarettes  . Smokeless tobacco: Never Used  . Tobacco comment: e-cig  Substance Use Topics  . Alcohol use: No    Alcohol/week: 0.0 standard drinks    Comment: occ  . Drug use: Yes    Types: Marijuana     Allergies   Lactose intolerance (gi) and Flagyl [metronidazole]   Review of Systems Review of Systems  Constitutional: Positive for unexpected weight change.  Respiratory: Negative.   Cardiovascular: Negative.   Gastrointestinal: Positive for abdominal distention, abdominal pain, diarrhea and nausea. Negative for anal bleeding, blood in stool, constipation, rectal pain and vomiting.  Musculoskeletal: Negative.   Skin: Negative.   All other systems reviewed and are negative.    Physical Exam Updated Vital Signs BP 118/88 (BP Location: Left Arm)   Pulse 60   Temp 97.9 F (36.6 C) (Oral)   Resp 18   Ht 5' 1"  (1.549 m)   Wt 59.6 kg   LMP 03/30/2018   SpO2 100%   BMI 24.85 kg/m   Physical Exam  Constitutional: She appears well-developed and well-nourished. No distress.  HENT:  Head: Atraumatic.  Mouth/Throat: Oropharynx is clear and moist.  Eyes: Pupils are equal, round, and reactive to light.  Neck: Normal range of motion.  Cardiovascular: Normal rate, regular rhythm, normal heart sounds and intact distal pulses. Exam reveals no gallop.  No murmur heard. Pulmonary/Chest: Effort normal and breath sounds normal. No stridor. No respiratory distress. She has no wheezes. She has no rales. She  exhibits no tenderness.  Abdominal: Soft. Normal appearance and bowel sounds are normal. She exhibits no shifting dullness, no distension, no pulsatile liver, no fluid wave, no abdominal bruit, no ascites, no pulsatile midline mass and no mass. There is no hepatosplenomegaly. There is tenderness in the epigastric area and periumbilical area. There is guarding. There is no rigidity, no rebound, no CVA tenderness, no tenderness at McBurney's point and negative Murphy's sign. No hernia.  Musculoskeletal: Normal range of motion.  Neurological: She is alert.  Skin: Skin is warm and dry. She is not diaphoretic.  Psychiatric: She has a normal mood and affect.  Nursing note and vitals reviewed.    ED Treatments / Results  Labs (all labs ordered are listed, but only  abnormal results are displayed) Labs Reviewed  CBC - Abnormal; Notable for the following components:      Result Value   Hemoglobin 11.1 (*)    MCV 79.7 (*)    MCH 24.5 (*)    Platelets 453 (*)    All other components within normal limits  URINALYSIS, ROUTINE W REFLEX MICROSCOPIC - Abnormal; Notable for the following components:   Color, Urine COLORLESS (*)    Specific Gravity, Urine 1.001 (*)    Hgb urine dipstick MODERATE (*)    All other components within normal limits  C DIFFICILE QUICK SCREEN W PCR REFLEX  LIPASE, BLOOD  COMPREHENSIVE METABOLIC PANEL  I-STAT BETA HCG BLOOD, ED ([facility], [facility], [facility] ONLY)    EKG None  Radiology Ct Abdomen Pelvis W Contrast  Result Date: 03/31/2018 CLINICAL DATA:  27 y/o F; increased diarrhea, nausea, loss of appetite, and weight loss. History of Crohn's disease. EXAM: CT ABDOMEN AND PELVIS WITH CONTRAST TECHNIQUE: Multidetector CT imaging of the abdomen and pelvis was performed using the standard protocol following bolus administration of intravenous contrast. CONTRAST:  129m ISOVUE-300 IOPAMIDOL (ISOVUE-300) INJECTION 61% COMPARISON:  08/04/2014 CT abdomen and pelvis. 03/18/2016 MRI of the  abdomen. FINDINGS: Lower chest: No acute abnormality. Hepatobiliary: No focal liver abnormality is seen. No gallstones, gallbladder wall thickening, or biliary dilatation. Pancreas: Unremarkable. No pancreatic ductal dilatation or surrounding inflammatory changes. Spleen: Normal in size without focal abnormality. Adrenals/Urinary Tract: Adrenal glands are unremarkable. Kidneys are normal, without renal calculi, focal lesion, or hydronephrosis. Bladder is unremarkable. Stomach/Bowel: Distal ileum is featureless with interval increase in wall thickening and there is mild proximal upstream dilatation of small bowel. Normal appendix. No wall thickening or inflammatory change of the colon identified. No mesenteric retraction or fistula identified. Vascular/Lymphatic: No significant vascular findings are present. No enlarged abdominal or pelvic lymph nodes. Reproductive: Uterus and bilateral adnexa are unremarkable. 15 mm cyst within the left side of the vulva, likely Bartholin cyst. Other: No abdominal wall hernia or abnormality. No abdominopelvic ascites. Musculoskeletal: No acute or significant osseous findings. IMPRESSION: Interval increase in wall thickening of the distal ileum probably reflecting active inflammation. Mild proximal upstream dilatation of small bowel may be due to underlying stricture or dysmotility. No evidence for perforation, abscess, fistula. Electronically Signed   By: LKristine GarbeM.D.   On: 03/31/2018 16:50    Procedures Procedures (including critical care time)  Medications Ordered in ED Medications  iopamidol (ISOVUE-300) 61 % injection (  Canceled Entry 03/31/18 1658)  sodium chloride 0.9 % bolus 1,000 mL (0 mLs Intravenous Stopped 03/31/18 1555)  morphine 4 MG/ML injection 4 mg (4 mg Intravenous Given 03/31/18 1413)  alum & mag hydroxide-simeth (MAALOX/MYLANTA) 200-200-20 MG/5ML suspension 30 mL (30 mLs Oral Given 03/31/18 1413)    And  lidocaine (XYLOCAINE) 2 %  viscous mouth solution 15 mL (15 mLs Oral Given 03/31/18 1413)  iohexol (OMNIPAQUE) 300 MG/ML solution 30 mL (30 mLs Oral Contrast Given 03/31/18 1430)  iopamidol (ISOVUE-300) 61 % injection 100 mL (100 mLs Intravenous Contrast Given 03/31/18 1626)  morphine 4 MG/ML injection 4 mg (4 mg Intravenous Given 03/31/18 1802)  ondansetron (ZOFRAN) injection 4 mg (4 mg Intravenous Given 03/31/18 1801)     Initial Impression / Assessment and Plan / ED Course  I have reviewed the triage vital signs and the nursing notes.  Pertinent labs & imaging results that were available during my care of the patient were reviewed by me and considered in my medical decision  making (see chart for details).  27 year old otherwise well-appearing female presents for evaluation of abdominal pain and diarrhea.  Tenderness with guarding located to the epigastric and periumbilical region.  No rebound or rigidity.  Afebrile, nonseptic, non-ill-appearing.  Will obtain labs, urine, CT scan and reevaluate.  Crohn's flare vs. acid reflux vs. ulcer vs. Gastroenteritis vs atypical appendicits.  Labs without leukocytosis, hCG negative, metabolic panel negative, hemoglobin 10.1 at patient's baseline compared to previous visits. CT with ileum wall thickening and inflammation, CDiff unable to be collected in the ED. Patient does not meet the SIRS or Sepsis criteria.  On repeat exam patient does not have a surgical abdomin and there are no peritoneal signs.  No indication of appendicitis, bowel obstruction, bowel perforation, cholecystitis, diverticulitis, PID or ectopic pregnancy.   On reevaluation patient with pain improvement. Her symptoms are likely the result of a Crohn's flair. Patient has follow-up appointment already scheduled for tomorrow morning, 04/01/18. Will dc home on Prednisone. Stable to dc home at this time. Discussed strict return precaution. Patient and family voice understanding and are agreeable for follow-up.   Final  Clinical Impressions(s) / ED Diagnoses   Final diagnoses:  Crohn's disease of colon without complication ([place])  Pain of upper abdomen  Diarrhea, unspecified type    ED Discharge Orders         Ordered    predniSONE (DELTASONE) 20 MG tablet  Daily     03/31/18 1742    ondansetron (ZOFRAN ODT) 4 MG disintegrating tablet  Every 8 hours PRN     03/31/18 1742    famotidine (PEPCID) 20 MG tablet  2 times daily     03/31/18 1742           Makiyla Linch A, PA-C 03/31/18 1808    Virgel Manifold, MD 04/01/18 1246

## 2018-03-31 NOTE — ED Notes (Signed)
Patient made aware we need stool sample. CT made aware patient has finished drinking oral contrast.

## 2018-03-31 NOTE — ED Triage Notes (Signed)
Pt states she has been having lots of diarrhea (5 large episodes). Pt states she has hx of chron's. Pt states she has nausea and weight loss. Pt also states she has had a lack of appetite. Pt states she has lost 5 lbs in 1.5 weeks.

## 2018-03-31 NOTE — Discharge Instructions (Addendum)
Evaluated today for abdominal pain.  CT scan did show that he might have some evidence of inflammation.  This could be a flare of your Crohn's disease.  I will prescribe you prednisone.  Please take as prescribed.  I will also refill your Zofran and Pepcid.  Please keep your follow-up appointment tomorrow for reevaluation.  Return to the ED with any new or worsening symptoms

## 2018-06-13 ENCOUNTER — Encounter (HOSPITAL_COMMUNITY): Payer: Self-pay | Admitting: *Deleted

## 2018-06-13 ENCOUNTER — Inpatient Hospital Stay (HOSPITAL_COMMUNITY)
Admission: AD | Admit: 2018-06-13 | Discharge: 2018-06-13 | Disposition: A | Discharge status: Home / Self Care | Payer: Medicaid Other | Source: Ambulatory Visit | Attending: Obstetrics & Gynecology | Admitting: Obstetrics & Gynecology

## 2018-06-13 DIAGNOSIS — F17210 Nicotine dependence, cigarettes, uncomplicated: Secondary | ICD-10-CM | POA: Diagnosis not present

## 2018-06-13 DIAGNOSIS — N750 Cyst of Bartholin's gland: Secondary | ICD-10-CM | POA: Diagnosis present

## 2018-06-13 DIAGNOSIS — N751 Abscess of Bartholin's gland: Secondary | ICD-10-CM | POA: Insufficient documentation

## 2018-06-13 LAB — URINALYSIS, ROUTINE W REFLEX MICROSCOPIC
Bilirubin Urine: NEGATIVE
GLUCOSE, UA: NEGATIVE mg/dL
Hgb urine dipstick: NEGATIVE
KETONES UR: NEGATIVE mg/dL
LEUKOCYTES UA: NEGATIVE
Nitrite: NEGATIVE
PROTEIN: NEGATIVE mg/dL
Specific Gravity, Urine: 1.016 (ref 1.005–1.030)
pH: 5 (ref 5.0–8.0)

## 2018-06-13 LAB — POCT PREGNANCY, URINE: Preg Test, Ur: NEGATIVE

## 2018-06-13 MED ORDER — LIDOCAINE HCL URETHRAL/MUCOSAL 2 % EX GEL
1.0000 "application " | Freq: Once | CUTANEOUS | Status: AC
  Administered 2018-06-13: 1 via TOPICAL
  Filled 2018-06-13: qty 5

## 2018-06-13 MED ORDER — KETOROLAC TROMETHAMINE 60 MG/2ML IM SOLN
60.0000 mg | Freq: Once | INTRAMUSCULAR | Status: AC
  Administered 2018-06-13: 60 mg via INTRAMUSCULAR
  Filled 2018-06-13: qty 2

## 2018-06-13 MED ORDER — SULFAMETHOXAZOLE-TRIMETHOPRIM 800-160 MG PO TABS: 1.0000 | ORAL_TABLET | Freq: Two times a day (BID) | ORAL | 0 refills | Status: DC

## 2018-06-13 MED ORDER — OXYCODONE-ACETAMINOPHEN 5-325 MG PO TABS
2.0000 | ORAL_TABLET | Freq: Once | ORAL | Status: AC
  Administered 2018-06-13: 2 via ORAL
  Filled 2018-06-13: qty 2

## 2018-06-13 NOTE — Discharge Instructions (Signed)
Bartholin's Cyst or Abscess  A Bartholin's cyst is a fluid-filled sac that forms on a Bartholin's gland. Bartholin's glands are small glands in the folds of skin around the vaginal opening (labia). These glands produce a fluid to moisten (lubricate) the outside of the vagina during sex. A cyst that is not large or infected may not cause any problems or require treatment. If the cyst gets infected, it is called a Bartholin's abscess. An abscess may cause symptoms such as pain and swelling and is more likely to require treatment. What are the causes? This condition may be caused by a blocked Bartholin's gland. These glands can become blocked due to natural buildup of fluid and oils. Bacteria inside of the cyst can cause infection. In many cases, the cause is not known. What increases the risk? You may be at increased risk of developing a Bartholin's cyst or abscess if:  You are of childbearing age.  You have a history of Bartholin's cysts or abscesses.  You have diabetes.  You have an STI (sexually transmitted infection). What are the signs or symptoms? Symptoms may include:  A bulge or lump on the labia, near the lower opening of the vagina.  Discomfort or pain. This may get worse during sex or when walking.  Redness, swelling, or fluid draining from the area. These may be signs of an abscess. How severe your symptoms are depends on the size of your cyst and whether it is infected. Infection causes symptoms to get more severe. How is this diagnosed? This condition may be diagnosed based on:  Your symptoms and medical history.  A physical exam to check for swelling in your vaginal area. You may lie on your back on an exam table and have your feet placed into footrests for the exam.  Blood tests to check for infections.  Removal of a fluid sample from the cyst or abscess (biopsy) for testing. You may work with a health care provider who specializes in [employer] health (gynecologist)  for diagnosis and treatment. How is this treated? If your cyst is small, not infected, and not causing symptoms, you may not need any treatment. These cysts often go away on their own, with home care such as hot baths or warm compresses. If you have a large cyst or an abscess, treatment may include:  Antibiotic medicine.  A procedure to drain the fluid inside the cyst or abscess. These procedures involve making an incision in the cyst or abscess so that the fluid drains out, and then one of the following may be done: ? A small, thin tube (catheter) may be placed inside the cyst or abscess so that it does not close and fill up with fluid again (fistulization). The catheter will be removed at a follow-up visit. ? The edges of the incision may be stitched to your skin so that the cyst or abscess stays open (marsupialization). This allows it to continue to drain and not fill up with fluid again. If you have cysts or abscesses that keep returning (recurring) and have required incision and drainage multiple times, your health care provider may talk with you about surgery to remove the Bartholin's gland. Follow these instructions at home: Medicines  Take over-the-counter and prescription medicines only as told by your health care provider.  If you were prescribed an antibiotic medicine, take it as told by your health care provider. Do not stop taking the antibiotic even if your condition improves. Managing pain and swelling  Try sitz baths to  help with pain and swelling. A sitz bath is a warm water bath in which the water only comes up to your hips and should cover your buttocks. You may take sitz baths several times a day.  Apply heat to the affected area as often as needed. Use the heat source that your health care provider recommends, such as a moist heat pack or a heating pad. ? Place a towel between your skin and the heat source. ? Leave the heat on for 20-30 minutes. ? Remove the heat if your  skin turns bright red. This is especially important if you are unable to feel pain, heat, or cold. You may have a greater risk of getting burned. General instructions  If your cyst or abscess was drained, follow instructions from your health care provider about how to take care of your wound. Use feminine pads as needed to absorb any drainage.  Do not push on or squeeze your cyst.  Do not have sex until the cyst has gone away or your wound from drainage has healed.  Take these steps to help prevent a Bartholin's cyst from returning, and to prevent other Bartholin's cysts from developing: ? Take a bath or shower once a day. Clean your vaginal area with mild soap and water when you bathe. ? Practice safe sex to prevent STIs. Talk with your health care provider about how to prevent STIs and which forms of birth control (contraception) may be best for you.  Keep all follow-up visits as told by your health care provider. This is important. Contact a health care provider if:  You have a fever.  You develop redness, swelling, or pain around your cyst.  You have fluid, blood, pus, or a bad smell coming from your cyst.  You have a cyst that gets larger or comes back. Summary  A Bartholin's cyst is a fluid-filled sac that forms on a Bartholin's gland. These glands are in the folds of skin around the vaginal opening (labia).  If your cyst is small, not infected, and not causing symptoms, you may not need any treatment. These cysts often go away on their own, with home care such as hot baths or warm compresses.  If you have a large cyst or an abscess, your health care provider may perform a procedure to drain the fluid.  If you have cysts or abscesses that keep returning (recurring) and have required incision and drainage multiple times, your health care provider may talk with you about surgery to remove the Bartholin's gland. This information is not intended to replace advice given to you by  your health care provider. Make sure you discuss any questions you have with your health care provider. Document Released: 05/27/2005 Document Revised: 02/26/2017 Document Reviewed: 02/26/2017 Elsevier Interactive Patient Education  2019 [employer].   Incision and Drainage, Care After Refer to this sheet in the next few weeks. These instructions provide you with information about caring for yourself after your procedure. Your health care provider may also give you more specific instructions. Your treatment has been planned according to current medical practices, but problems sometimes occur. Call your health care provider if you have any problems or questions after your procedure. What can I expect after the procedure? After the procedure, it is common to have:  Pain or discomfort around your incision site.  Drainage from your incision. Follow these instructions at home:  Take over-the-counter and prescription medicines only as told by your health care provider.  If you  were prescribed an antibiotic medicine, take it as told by your health care provider.Do not stop taking the antibiotic even if you start to feel better.  Followinstructions from your health care provider about: ? How to take care of your incision. ? When and how you should change your packing and bandage (dressing). Wash your hands with soap and water before you change your dressing. If soap and water are not available, use hand sanitizer. ? When you should remove your dressing.  Do not take baths, swim, or use a hot tub until your health care provider approves.  Keep all follow-up visits as told by your health care provider. This is important.  Check your incision area every day for signs of infection. Check for: ? More redness, swelling, or pain. ? More fluid or blood. ? Warmth. ? Pus or a bad smell. Contact a health care provider if:  Your cyst or abscess returns.  You have a fever.  You have more  redness, swelling, or pain around your incision.  You have more fluid or blood coming from your incision.  Your incision feels warm to the touch.  You have pus or a bad smell coming from your incision. Get help right away if:  You have severe pain or bleeding.  You cannot eat or drink without vomiting.  You have decreased urine output.  You become short of breath.  You have chest pain.  You cough up blood.  The area where the incision and drainage occurred becomes numb or it tingles. This information is not intended to replace advice given to you by your health care provider. Make sure you discuss any questions you have with your health care provider. Document Released: 08/19/2011 Document Revised: 10/27/2015 Document Reviewed: 03/17/2015 Elsevier Interactive Patient Education  2019 [employer].

## 2018-06-13 NOTE — MAU Note (Signed)
Pt c/o enlarged cyst on left labia since Monday.  Pt states that now her leg is feeling numb and the glands on the mons are enlarged.  No drainage or oozing from site.

## 2018-06-13 NOTE — MAU Provider Note (Signed)
Chief Complaint: Bartholin's Cyst   First Provider Initiated Contact with Patient 06/13/18 1715     SUBJECTIVE HPI: Sharon Clayton is a 28 y.o. non pregnant female who presents [facility] reporting vaginal pain. Hx of bartholin gland abscess 5 years ago. Noticed lump last week. Was initially grape sized but has now grown bigger and become very painful. Has tried tylenol, ibuprofen, vaseline, & warm compresses without relief.  Denies fever/chills, abdominal pain, or vaginal discharge.   Location: left labia Quality: pressure, discomfort Severity: 10/10 on pain scale Duration: 1 week Timing: constant Modifying factors: worse with sitting. Not improved with meds & warm compresses Associated signs and symptoms: none  Past Medical History:  Diagnosis Date  . Bacterial infection 09/10/11  . Crohn's disease ([place])   . H/O varicella 09/10/11  . Hypotension   . IBS (irritable bowel syndrome)   . Low iron 09/10/11  . Trichomonas 09/10/10  . Yeast infection 09/10/11   OB History  Gravida Para Term Preterm AB Living  4 2 2  0 1 2  SAB TAB Ectopic Multiple Live Births  1 0 0 0 2    # Outcome Date GA Lbr Len/2nd Weight Sex Delivery Anes PTL Lv  4 Gravida           3 Term 09/04/13 [redacted]w[redacted]d 2591 g F CS-LTranv EPI  LIV  2 Term 06/09/09 [id number] g M CS-LTranv EPI  LIV     [facility]   [facility]AB            Past Surgical History:  Procedure Laterality Date  . CESAREAN SECTION  09/10/11   NRFHT - primary low transverse cesarean section   . CESAREAN SECTION N/A 09/04/2013   Procedure: CESAREAN SECTION- repeat;  Surgeon: UgOsborne OmanMD;  [facility];  Service: Obstetrics;  Laterality: N/A;   Social History   Socioeconomic History  . Marital status: Married    Spouse name: Not on file  . Number of children: 2  . Years of education: Not on file  . Highest education level: Not on file  Occupational History  . Occupation:  HoAgricultural engineerSocial Needs  . Financial resource strain: Not on file  . Food insecurity:    Worry: Not on file    Inability: Not on file  . Transportation needs:    Medical: Not on file    Non-medical: Not on file  Tobacco Use  . Smoking status: Current Every Day Smoker    Packs/day: 1.00    Years: 8.00    Pack years: 8.00    Types: Cigarettes  . Smokeless tobacco: Never Used  . Tobacco comment: e-cig  Substance and Sexual Activity  . Alcohol use: No    Alcohol/week: 0.0 standard drinks    Comment: occ  . Drug use: Yes    Types: Marijuana  . Sexual activity: Yes    Birth control/protection: None  Lifestyle  . Physical activity:    Days per week: Not on file    Minutes per session: Not on file  . Stress: Not on file  Relationships  . Social connections:    Talks on phone: Not on file    Gets together: Not on file    Attends religious service: Not on file    Active member of club or organization: Not on file    Attends meetings of clubs or organizations: Not on file  Relationship status: Not on file  . Intimate partner violence:    Fear of current or ex partner: Not on file    Emotionally abused: Not on file    Physically abused: Not on file    Forced sexual activity: Not on file  Other Topics Concern  . Not on file  Social History Narrative  . Not on file   Family History  Problem Relation Age of Onset  . Hypertension Mother   . Hypertension Father   . Colon cancer Neg Hx    No current facility-administered medications on file prior to encounter.    Current Outpatient Medications on File Prior to Encounter  Medication Sig Dispense Refill  . acidophilus (RISAQUAD) CAPS capsule Take 1 capsule by mouth daily.    . cetirizine (ZYRTEC) 10 MG tablet Take 10 mg by mouth daily as needed for allergies.    Marland Kitchen dicyclomine (BENTYL) 10 MG capsule Take 2 capsules (20 mg total) by mouth 4 (four) times daily as needed for spasms. 20 capsule 0  . diphenhydramine-acetaminophen  (TYLENOL PM) 25-500 MG TABS tablet Take 1 tablet by mouth at bedtime as needed (for pain/sleep).    . famotidine (PEPCID) 20 MG tablet Take 1 tablet (20 mg total) by mouth 2 (two) times daily. 30 tablet 0  . ibuprofen (ADVIL,MOTRIN) 200 MG tablet Take 200 mg by mouth every 6 (six) hours as needed for moderate pain.    Marland Kitchen loperamide (IMODIUM) 2 MG capsule Take 1 capsule (2 mg total) by mouth 4 (four) times daily as needed for diarrhea or loose stools. 12 capsule 0  . ondansetron (ZOFRAN ODT) 4 MG disintegrating tablet Take 1 tablet (4 mg total) by mouth every 8 (eight) hours as needed for nausea or vomiting. 20 tablet 0  . Simethicone (PHAZYME MAXIMUM STRENGTH) 250 MG CAPS Take 250 mg by mouth daily as needed (gas).     Allergies  Allergen Reactions  . Lactose Intolerance (Gi) Diarrhea and Nausea And Vomiting  . Flagyl [Metronidazole] Itching and Rash    Pill form    I have reviewed patient's Past Medical Hx, Surgical Hx, Family Hx, Social Hx, medications and allergies.   Review of Systems  Constitutional: Negative.   Gastrointestinal: Negative.   Genitourinary: Positive for vaginal pain. Negative for vaginal discharge.    OBJECTIVE Patient Vitals for the past 24 hrs:  BP Temp Temp src Pulse Resp SpO2 Weight  06/13/18 1534 117/86 98.2 F (36.8 C) Oral 91 18 100 % 58.6 kg   Constitutional: Well-developed, well-nourished female in no acute distress.  Cardiovascular: normal rate & rhythm, no murmur Respiratory: normal rate and effort. Lung sounds clear throughout GI: Abd soft, non-tender, Pos BS x 4. No guarding or rebound tenderness MS: Extremities nontender, no edema, normal ROM Neurologic: Alert and oriented x 4.  GU:   Left labial mass c/w bartholin's abscess. ~6 cm. Fluctuant. No drainage.   LAB RESULTS Results for orders placed or performed during the hospital encounter of 06/13/18 (from the past 24 hour(s))  Urinalysis, Routine w reflex microscopic     Status: None    Collection Time: 06/13/18  3:57 PM  Result Value Ref Range   Color, Urine YELLOW YELLOW   APPearance CLEAR CLEAR   Specific Gravity, Urine 1.016 1.005 - 1.030   pH 5.0 5.0 - 8.0   Glucose, UA NEGATIVE NEGATIVE mg/dL   Hgb urine dipstick NEGATIVE NEGATIVE   Bilirubin Urine NEGATIVE NEGATIVE   Ketones, ur NEGATIVE NEGATIVE mg/dL  Protein, ur NEGATIVE NEGATIVE mg/dL   Nitrite NEGATIVE NEGATIVE   Leukocytes, UA NEGATIVE NEGATIVE  Pregnancy, urine POC     Status: None   Collection Time: 06/13/18  3:59 PM  Result Value Ref Range   Preg Test, Ur NEGATIVE NEGATIVE    IMAGING No results found.  MAU COURSE Orders Placed This Encounter  Procedures  . Urinalysis, Routine w reflex microscopic  . Pregnancy, urine POC  . Discharge patient   Meds ordered this encounter  Medications  . lidocaine (XYLOCAINE) 2 % jelly 1 application  . oxyCODONE-acetaminophen (PERCOCET/ROXICET) 5-325 MG per tablet 2 tablet  . sulfamethoxazole-trimethoprim (BACTRIM DS,SEPTRA DS) 800-160 MG tablet    Sig: Take 1 tablet by mouth 2 (two) times daily.    Dispense:  6 tablet    Refill:  0    Order Specific Question:   Supervising Provider    Answer:   Verita Schneiders A [[id number]]    MDM UPT negative GC/CT collected  Bartholin Cyst I&D and Word Catheter Placement Enlarged abscess palpated in front of the hymenal ring around 5 o' clock.  Written informed consent was obtained.  Discussed complications and possible outcomes of procedure including recurrence of cyst, scarring leading to infecton, bleeding, dyspareunia, distortion of anatomy.  Patient was examined in the dorsal lithotomy position and mass was identified. The area was prepped with Iodine and draped in a sterile manner. 1% Lidocaine (3 ml) was then used to infiltrate area on top of the cyst, behind the hymenal ring.  A 7 mm incision was made using a sterile scapel. Upon palpation of the mass, a moderate amount of bloody purulent drainage was expressed  through the incision. A hemostat was used to break up loculations, which resulted in expression of more bloody purulent drainage.The open cyst was then copiously irrigated with normal saline, and a Word catheter was placed. 3 ml of sterile water was used to inflate the catheter balloon.  The end of the catheter was tucked into the vagina.  Patient tolerated the procedure well. .  ASSESSMENT 1. Bartholin's gland abscess     PLAN Discharge home in stable condition. GC/CT pending Rx bactrim Discussed tx of symptoms at home including tylenol, ibuprofen, sitz baths Msg to clinic for f/u in 4 wks  [place] [facility] Follow up.   Specialty:  Obstetrics and Gynecology Why:  Office will call you to schedule follow up.  Contact information: [place] [phone]         Allergies as of 06/13/2018      Reactions   Lactose Intolerance (gi) Diarrhea, Nausea And Vomiting   Flagyl [metronidazole] Itching, Rash   Pill form      Medication List    TAKE these medications   acidophilus Caps capsule Take 1 capsule by mouth daily.   cetirizine 10 MG tablet Commonly known as:  ZYRTEC Take 10 mg by mouth daily as needed for allergies.   dicyclomine 10 MG capsule Commonly known as:  BENTYL Take 2 capsules (20 mg total) by mouth 4 (four) times daily as needed for spasms.   diphenhydramine-acetaminophen 25-500 MG Tabs tablet Commonly known as:  TYLENOL PM Take 1 tablet by mouth at bedtime as needed (for pain/sleep).   famotidine 20 MG tablet Commonly known as:  PEPCID Take 1 tablet (20 mg total) by mouth 2 (two) times daily.   ibuprofen 200 MG tablet Commonly known as:  ADVIL,MOTRIN Take 200  mg by mouth every 6 (six) hours as needed for moderate pain.   loperamide 2 MG capsule Commonly known as:  IMODIUM Take 1 capsule (2 mg total) by mouth 4 (four) times daily as needed for diarrhea or loose  stools.   ondansetron 4 MG disintegrating tablet Commonly known as:  ZOFRAN ODT Take 1 tablet (4 mg total) by mouth every 8 (eight) hours as needed for nausea or vomiting.   PHAZYME MAXIMUM STRENGTH 250 MG Caps Generic drug:  Simethicone Take 250 mg by mouth daily as needed (gas).   sulfamethoxazole-trimethoprim 800-160 MG tablet Commonly known as:  BACTRIM DS,SEPTRA DS Take 1 tablet by mouth 2 (two) times daily.        Jorje Guild, NP 06/13/2018  8:09 PM

## 2018-06-13 NOTE — MAU Note (Signed)
Sharon Clayton is a 28 y.o.  here in MAU reporting: married 3 weeks ago. Has noticed left bartholin cyst on left is swelling and getting worse over the past 5-6 days. Endorses having in the past.  LMP: 05/21/18 Pain score: 10/10  Lab orders placed from triage: ua and pregnancy test

## 2018-06-15 ENCOUNTER — Telehealth: Payer: Self-pay | Admitting: Family Medicine

## 2018-06-15 LAB — GC/CHLAMYDIA PROBE AMP (~~LOC~~) NOT AT ARMC
Chlamydia: NEGATIVE
NEISSERIA GONORRHEA: NEGATIVE

## 2018-06-15 NOTE — Telephone Encounter (Signed)
Called the patient and informed of upcoming appt. Patient stated she will attend.

## 2018-06-17 ENCOUNTER — Encounter (HOSPITAL_COMMUNITY): Payer: Self-pay

## 2018-06-17 ENCOUNTER — Other Ambulatory Visit: Payer: Self-pay

## 2018-06-17 ENCOUNTER — Inpatient Hospital Stay (HOSPITAL_COMMUNITY)
Admission: AD | Admit: 2018-06-17 | Discharge: 2018-06-17 | Disposition: A | Discharge status: Home / Self Care | Payer: Medicaid Other | Attending: Obstetrics and Gynecology | Admitting: Obstetrics and Gynecology

## 2018-06-17 DIAGNOSIS — N750 Cyst of Bartholin's gland: Secondary | ICD-10-CM | POA: Diagnosis not present

## 2018-06-17 DIAGNOSIS — R102 Pelvic and perineal pain: Secondary | ICD-10-CM | POA: Diagnosis present

## 2018-06-17 DIAGNOSIS — F17210 Nicotine dependence, cigarettes, uncomplicated: Secondary | ICD-10-CM | POA: Diagnosis not present

## 2018-06-17 DIAGNOSIS — B3731 Acute candidiasis of vulva and vagina: Secondary | ICD-10-CM

## 2018-06-17 DIAGNOSIS — B373 Candidiasis of vulva and vagina: Secondary | ICD-10-CM | POA: Insufficient documentation

## 2018-06-17 LAB — URINALYSIS, ROUTINE W REFLEX MICROSCOPIC
BILIRUBIN URINE: NEGATIVE
Glucose, UA: NEGATIVE mg/dL
Hgb urine dipstick: NEGATIVE
Ketones, ur: NEGATIVE mg/dL
Leukocytes, UA: NEGATIVE
Nitrite: NEGATIVE
Protein, ur: NEGATIVE mg/dL
Specific Gravity, Urine: 1.012 (ref 1.005–1.030)
pH: 5 (ref 5.0–8.0)

## 2018-06-17 LAB — POCT PREGNANCY, URINE: Preg Test, Ur: NEGATIVE

## 2018-06-17 MED ORDER — OXYCODONE-ACETAMINOPHEN 5-325 MG PO TABS: 1.0000 | ORAL_TABLET | Freq: Four times a day (QID) | ORAL | 0 refills | Status: DC | PRN

## 2018-06-17 MED ORDER — LIDOCAINE HCL URETHRAL/MUCOSAL 2 % EX GEL
1.0000 "application " | Freq: Once | CUTANEOUS | Status: AC
  Administered 2018-06-17: 1 via TOPICAL
  Filled 2018-06-17: qty 5

## 2018-06-17 MED ORDER — FLUCONAZOLE 150 MG PO TABS: 150.0000 mg | ORAL_TABLET | ORAL | 0 refills | Status: AC

## 2018-06-17 MED ORDER — OXYCODONE-ACETAMINOPHEN 5-325 MG PO TABS
2.0000 | ORAL_TABLET | Freq: Once | ORAL | Status: AC
  Administered 2018-06-17: 2 via ORAL
  Filled 2018-06-17: qty 2

## 2018-06-17 NOTE — MAU Note (Signed)
Pt states she was here 3-4 days ago for a bartholin cyst.   Pt reports severe vaginal pain where the catheter is placed.   Pt reports a severe yeast infection from antibiotics given at last visit.   Pt reports vaginal itching and burning

## 2018-06-17 NOTE — MAU Provider Note (Signed)
Chief Complaint: Vaginal Pain   First Provider Initiated Contact with Patient 06/17/18 1921      SUBJECTIVE HPI: Sharon Clayton is a 28 y.o. [id number] who presents to maternity admissions reporting vaginal pain where word catheter is placed and I&D of bartholins was completed on 06/13/2018 and extreme vaginal itching and white discharge with yeast infection. Her pain is constant, sharp and burning, severe and located on her left labia where the bartholin's cyst was and where the catheter is still present.  Since starting Bactrim DS as prescribed on 06/13/2018, she has developed white discharge with itching. She used some leftover Monistat at home but the itching has gotten worse. There are no other symptoms. She has not tried any other treatments.    HPI  Past Medical History:  Diagnosis Date  . Bacterial infection 09/10/11  . Crohn's disease ([place])   . H/O varicella 09/10/11  . Hypotension   . IBS (irritable bowel syndrome)   . Low iron 09/10/11  . Trichomonas 09/10/10  . Yeast infection 09/10/11   Past Surgical History:  Procedure Laterality Date  . CESAREAN SECTION  09/10/11   NRFHT - primary low transverse cesarean section   . CESAREAN SECTION N/A 09/04/2013   Procedure: CESAREAN SECTION- repeat;  Surgeon: Osborne Oman, MD;  Location: [facility];  Service: Obstetrics;  Laterality: N/A;   Social History   Socioeconomic History  . Marital status: Married    Spouse name: Not on file  . Number of children: 2  . Years of education: Not on file  . Highest education level: Not on file  Occupational History  . Occupation: Agricultural engineer  Social Needs  . Financial resource strain: Not on file  . Food insecurity:    Worry: Not on file    Inability: Not on file  . Transportation needs:    Medical: Not on file    Non-medical: Not on file  Tobacco Use  . Smoking status: Current Every Day Smoker    Packs/day: 1.00    Years: 8.00    Pack years: 8.00    Types: Cigarettes  . Smokeless tobacco: Never  Used  . Tobacco comment: e-cig  Substance and Sexual Activity  . Alcohol use: Yes    Alcohol/week: 0.0 standard drinks    Comment: occ  . Drug use: Yes    Types: Marijuana  . Sexual activity: Yes    Birth control/protection: None  Lifestyle  . Physical activity:    Days per week: Not on file    Minutes per session: Not on file  . Stress: Not on file  Relationships  . Social connections:    Talks on phone: Not on file    Gets together: Not on file    Attends religious service: Not on file    Active member of club or organization: Not on file    Attends meetings of clubs or organizations: Not on file    Relationship status: Not on file  . Intimate partner violence:    Fear of current or ex partner: Not on file    Emotionally abused: Not on file    Physically abused: Not on file    Forced sexual activity: Not on file  Other Topics Concern  . Not on file  Social History Narrative  . Not on file   No current facility-administered medications on file prior to encounter.    Current Outpatient Medications on File Prior to Encounter  Medication Sig Dispense Refill  . acidophilus (  RISAQUAD) CAPS capsule Take 1 capsule by mouth daily.    . cetirizine (ZYRTEC) 10 MG tablet Take 10 mg by mouth daily as needed for allergies.    Marland Kitchen dicyclomine (BENTYL) 10 MG capsule Take 2 capsules (20 mg total) by mouth 4 (four) times daily as needed for spasms. 20 capsule 0  . diphenhydramine-acetaminophen (TYLENOL PM) 25-500 MG TABS tablet Take 1 tablet by mouth at bedtime as needed (for pain/sleep).    . famotidine (PEPCID) 20 MG tablet Take 1 tablet (20 mg total) by mouth 2 (two) times daily. 30 tablet 0  . ibuprofen (ADVIL,MOTRIN) 200 MG tablet Take 200 mg by mouth every 6 (six) hours as needed for moderate pain.    Marland Kitchen loperamide (IMODIUM) 2 MG capsule Take 1 capsule (2 mg total) by mouth 4 (four) times daily as needed for diarrhea or loose stools. 12 capsule 0  . ondansetron (ZOFRAN ODT) 4 MG  disintegrating tablet Take 1 tablet (4 mg total) by mouth every 8 (eight) hours as needed for nausea or vomiting. 20 tablet 0  . Simethicone (PHAZYME MAXIMUM STRENGTH) 250 MG CAPS Take 250 mg by mouth daily as needed (gas).    Marland Kitchen sulfamethoxazole-trimethoprim (BACTRIM DS,SEPTRA DS) 800-160 MG tablet Take 1 tablet by mouth 2 (two) times daily. 6 tablet 0   Allergies  Allergen Reactions  . Lactose Intolerance (Gi) Diarrhea and Nausea And Vomiting  . Flagyl [Metronidazole] Itching and Rash    Pill form    ROS:  Review of Systems  Constitutional: Negative for chills, fatigue and fever.  Respiratory: Negative for shortness of breath.   Cardiovascular: Negative for chest pain.  Genitourinary: Positive for vaginal discharge and vaginal pain. Negative for difficulty urinating, dysuria, flank pain, pelvic pain and vaginal bleeding.  Neurological: Negative for dizziness and headaches.  Psychiatric/Behavioral: Positive for sleep disturbance.     I have reviewed patient's Past Medical Hx, Surgical Hx, Family Hx, Social Hx, medications and allergies.   Physical Exam   Patient Vitals for the past 24 hrs:  BP Temp Pulse Resp SpO2 Weight  06/17/18 1839 125/69 98 F (36.7 C) 91 12 98 % -  06/17/18 1830 - - - - - 58.3 kg   Constitutional: Well-developed, well-nourished female in moderate distress.  Cardiovascular: normal rate Respiratory: normal effort GI: Abd soft, non-tender. Pos BS x 4 MS: Extremities nontender, no edema, normal ROM Neurologic: Alert and oriented x 4.  GU: Neg CVAT.  PELVIC EXAM: Visual inspection with word catheter in left labia, no edema, erythema, or exudate.  Thick white vaginal discharge at introitus c/w yeast.   LAB RESULTS Results for orders placed or performed during the hospital encounter of 06/17/18 (from the past 24 hour(s))  Urinalysis, Routine w reflex microscopic     Status: None   Collection Time: 06/17/18  6:40 PM  Result Value Ref Range   Color, Urine  YELLOW YELLOW   APPearance CLEAR CLEAR   Specific Gravity, Urine 1.012 1.005 - 1.030   pH 5.0 5.0 - 8.0   Glucose, UA NEGATIVE NEGATIVE mg/dL   Hgb urine dipstick NEGATIVE NEGATIVE   Bilirubin Urine NEGATIVE NEGATIVE   Ketones, ur NEGATIVE NEGATIVE mg/dL   Protein, ur NEGATIVE NEGATIVE mg/dL   Nitrite NEGATIVE NEGATIVE   Leukocytes, UA NEGATIVE NEGATIVE  Pregnancy, urine POC     Status: None   Collection Time: 06/17/18  7:38 PM  Result Value Ref Range   Preg Test, Ur NEGATIVE NEGATIVE  IMAGING No results found.  MAU Management/MDM: Orders Placed This Encounter  Procedures  . Urinalysis, Routine w reflex microscopic  . Pregnancy, urine POC  . Discharge patient    Meds ordered this encounter  Medications  . oxyCODONE-acetaminophen (PERCOCET/ROXICET) 5-325 MG per tablet 2 tablet  . lidocaine (XYLOCAINE) 2 % jelly 1 application  . oxyCODONE-acetaminophen (PERCOCET/ROXICET) 5-325 MG tablet    Sig: Take 1 tablet by mouth every 6 (six) hours as needed for severe pain.    Dispense:  6 tablet    Refill:  0    Order Specific Question:   Supervising Provider    Answer:   Donnamae Jude [[id number]]    Pt exam c/w severe yeast infection.  Discussed options including removing word catheter or leaving it in and treating yeast and pain today with plan to follow up.  Pt selects to keep word catheter in and treat pain and yeast. Pt has f/u in [facility] office on 07/15/2018.  Topical lidocaine and Percocet 5/325 x 2 given in MAU with some relief. Rx for Diflucan 150 mg x 1, with 1 refill, and Terazol 7.  Pt to keep outpatient appt, return to MAU if pain persists or worsens. Pt discharged with strict return precautions.  ASSESSMENT 1. Infected cyst of Bartholin's gland duct   2. Vaginal candidiasis   3. Vaginal pain     PLAN Discharge home Allergies as of 06/17/2018      Reactions   Lactose Intolerance (gi) Diarrhea, Nausea And Vomiting   Flagyl [metronidazole] Itching, Rash   Pill form       Medication List    TAKE these medications   acidophilus Caps capsule Take 1 capsule by mouth daily.   cetirizine 10 MG tablet Commonly known as:  ZYRTEC Take 10 mg by mouth daily as needed for allergies.   dicyclomine 10 MG capsule Commonly known as:  BENTYL Take 2 capsules (20 mg total) by mouth 4 (four) times daily as needed for spasms.   diphenhydramine-acetaminophen 25-500 MG Tabs tablet Commonly known as:  TYLENOL PM Take 1 tablet by mouth at bedtime as needed (for pain/sleep).   famotidine 20 MG tablet Commonly known as:  PEPCID Take 1 tablet (20 mg total) by mouth 2 (two) times daily.   ibuprofen 200 MG tablet Commonly known as:  ADVIL,MOTRIN Take 200 mg by mouth every 6 (six) hours as needed for moderate pain.   loperamide 2 MG capsule Commonly known as:  IMODIUM Take 1 capsule (2 mg total) by mouth 4 (four) times daily as needed for diarrhea or loose stools.   ondansetron 4 MG disintegrating tablet Commonly known as:  ZOFRAN ODT Take 1 tablet (4 mg total) by mouth every 8 (eight) hours as needed for nausea or vomiting.   oxyCODONE-acetaminophen 5-325 MG tablet Commonly known as:  PERCOCET/ROXICET Take 1 tablet by mouth every 6 (six) hours as needed for severe pain.   PHAZYME MAXIMUM STRENGTH 250 MG Caps Generic drug:  Simethicone Take 250 mg by mouth daily as needed (gas).   sulfamethoxazole-trimethoprim 800-160 MG tablet Commonly known as:  BACTRIM DS,SEPTRA DS Take 1 tablet by mouth 2 (two) times daily.      Follow-up [place] for [place] Follow up.   Specialty:  Obstetrics and Gynecology Why:  ON 07/16/2018 as scheduled. Return to MAU or ED with gyn emergencies. Contact information: [place] [phone]  Fatima Blank Certified Nurse-Midwife 06/17/2018  8:01 PM

## 2018-07-16 ENCOUNTER — Encounter: Payer: Self-pay | Admitting: Obstetrics and Gynecology

## 2018-07-16 ENCOUNTER — Ambulatory Visit (INDEPENDENT_AMBULATORY_CARE_PROVIDER_SITE_OTHER): Payer: Medicaid Other | Admitting: Obstetrics and Gynecology

## 2018-07-16 DIAGNOSIS — N750 Cyst of Bartholin's gland: Secondary | ICD-10-CM | POA: Insufficient documentation

## 2018-07-16 NOTE — Progress Notes (Signed)
Ms Verma is here for removal of word cathter placed on 06/13/18 for Bartholin abscess.  She has no complaints today. Has completed antibiotics  PE AF VSS Lungs clear Heart RRR Abd soft + BS  GU Word cathter noted and removed without problems. Pt tolerated well  A/P Bartholin abscess, resolved  Return to nl ADL's. F/U PRN

## 2018-07-16 NOTE — Patient Instructions (Signed)

## 2018-07-28 ENCOUNTER — Encounter: Payer: Self-pay | Admitting: *Deleted

## 2019-02-21 ENCOUNTER — Emergency Department (HOSPITAL_COMMUNITY)
Admission: EM | Admit: 2019-02-21 | Discharge: 2019-02-21 | Disposition: A | Discharge status: Home / Self Care | Payer: Medicaid Other | Attending: Emergency Medicine | Admitting: Emergency Medicine

## 2019-02-21 ENCOUNTER — Other Ambulatory Visit: Payer: Self-pay

## 2019-02-21 ENCOUNTER — Encounter (HOSPITAL_COMMUNITY): Payer: Self-pay

## 2019-02-21 DIAGNOSIS — R197 Diarrhea, unspecified: Secondary | ICD-10-CM

## 2019-02-21 DIAGNOSIS — Z720 Tobacco use: Secondary | ICD-10-CM | POA: Insufficient documentation

## 2019-02-21 LAB — COMPREHENSIVE METABOLIC PANEL
ALT: 16 U/L (ref 0–44)
AST: 20 U/L (ref 15–41)
Albumin: 3.6 g/dL (ref 3.5–5.0)
Alkaline Phosphatase: 83 U/L (ref 38–126)
Anion gap: 6 (ref 5–15)
BUN: 7 mg/dL (ref 6–20)
CO2: 24 mmol/L (ref 22–32)
Calcium: 8.9 mg/dL (ref 8.9–10.3)
Chloride: 108 mmol/L (ref 98–111)
Creatinine, Ser: 0.7 mg/dL (ref 0.44–1.00)
GFR calc Af Amer: >60 mL/min (ref >60)
GFR calc non Af Amer: >60 mL/min (ref >60)
Glucose, Bld: 74 mg/dL (ref 70–99)
Potassium: 3.5 mmol/L (ref 3.5–5.1)
Sodium: 138 mmol/L (ref 135–145)
Total Bilirubin: 0.7 mg/dL (ref 0.3–1.2)
Total Protein: 8.1 g/dL (ref 6.5–8.1)

## 2019-02-21 LAB — CBC
HCT: 34.9 % — ABNORMAL LOW (ref 36.0–46.0)
Hemoglobin: 11 g/dL — ABNORMAL LOW (ref 12.0–15.0)
MCH: 27 pg (ref 26.0–34.0)
MCHC: 31.5 g/dL (ref 30.0–36.0)
MCV: 85.7 fL (ref 80.0–100.0)
Platelets: 374 10*3/uL (ref 150–400)
RBC: 4.07 MIL/uL (ref 3.87–5.11)
RDW: 15.6 % — ABNORMAL HIGH (ref 11.5–15.5)
WBC: 5.1 10*3/uL (ref 4.0–10.5)
nRBC: 0 % (ref 0.0–0.2)

## 2019-02-21 LAB — URINALYSIS, ROUTINE W REFLEX MICROSCOPIC
Bilirubin Urine: NEGATIVE
Glucose, UA: NEGATIVE mg/dL
Hgb urine dipstick: NEGATIVE
Ketones, ur: NEGATIVE mg/dL
Leukocytes,Ua: NEGATIVE
Nitrite: NEGATIVE
Protein, ur: NEGATIVE mg/dL
Specific Gravity, Urine: 1.006 (ref 1.005–1.030)
pH: 6 (ref 5.0–8.0)

## 2019-02-21 LAB — I-STAT BETA HCG BLOOD, ED (MC, WL, AP ONLY): I-stat hCG, quantitative: <5 m[IU]/mL (ref <5)

## 2019-02-21 LAB — LIPASE, BLOOD: Lipase: 25 U/L (ref 11–51)

## 2019-02-21 MED ORDER — SODIUM CHLORIDE 0.9% FLUSH
3.0000 mL | Freq: Once | INTRAVENOUS | Status: AC
  Administered 2019-02-21: 3 mL via INTRAVENOUS

## 2019-02-21 MED ORDER — SODIUM CHLORIDE 0.9 % IV BOLUS
1000.0000 mL | Freq: Once | INTRAVENOUS | Status: AC
  Administered 2019-02-21: 11:00:00 1000 mL via INTRAVENOUS

## 2019-02-21 NOTE — ED Provider Notes (Signed)
[facility] DEPT Provider Note   CSN: [id number] Arrival date & time: 02/21/19  1008     History   Chief Complaint Chief Complaint  Patient presents with  . Diarrhea    HPI Sharon Clayton is a 28 y.o. female.     The history is provided by the patient. No language interpreter was used.  Diarrhea    28 year old female with history of IBS presenting for evaluation of diarrhea.  Patient report for the past 2 days she has had mild abdominal discomfort as well as mild nausea but mostly diarrhea.  States she has about 8 total episodes of nonbloody mild mucousy diarrhea.  She feels dehydrated.  She felt this is similar to prior IBS flare whenever she is stressed out.  She does not complain of any fever chills lightheadedness or dizziness chest pain shortness of breath, productive cough or any URI symptoms.  She denies any dysuria.  Her last menstrual period was 4 days ago.  She is an occasional smoker and occasional drinker.  She denies any recent antibiotic use or any recent sick contact with anyone with COVID-19.  She mentioned she was evaluated for Crohn's disease in the past however she was told that her endoscopy and colonoscopy did not show any concerning finding.  She mentioned managing her IBS with appropriate dieting.  Past Medical History:  Diagnosis Date  . Bacterial infection 09/10/11  . Crohn's disease ([place])   . H/O varicella 09/10/11  . Hypotension   . IBS (irritable bowel syndrome)   . Low iron 09/10/11  . Trichomonas 09/10/10  . Yeast infection 09/10/11    Patient Active Problem List   Diagnosis Date Noted  . Bartholin cyst 07/16/2018  . Crohn's disease with complication ([place]) 51/70/0174  . S/P cesarean section 09/04/2013  . Smoker 06/08/2013  . IBS (irritable bowel syndrome) 06/08/2013  . Uses marijuana 04/13/2013    Past Surgical History:  Procedure Laterality Date  . CESAREAN SECTION  09/10/11   NRFHT - primary low transverse cesarean  section   . CESAREAN SECTION N/A 09/04/2013   Procedure: CESAREAN SECTION- repeat;  Surgeon: Osborne Oman, MD;  Location: [facility];  Service: Obstetrics;  Laterality: N/A;     OB History    Gravida  4   Para  2   Term  2   Preterm  0   AB  2   Living  2     SAB  1   TAB  1   Ectopic  0   Multiple  0   Live Births  2            Home Medications    Prior to Admission medications   Medication Sig Start Date End Date Taking? Authorizing Provider  cetirizine (ZYRTEC) 10 MG tablet Take 10 mg by mouth daily as needed for allergies.    [provider]  diphenhydramine-acetaminophen (TYLENOL PM) 25-500 MG TABS tablet Take 1 tablet by mouth at bedtime as needed (for pain/sleep).    [provider]  ibuprofen (ADVIL,MOTRIN) 200 MG tablet Take 200 mg by mouth every 6 (six) hours as needed for moderate pain.    [provider]  Simethicone (PHAZYME MAXIMUM STRENGTH) 250 MG CAPS Take 250 mg by mouth daily as needed (gas).    [provider]    Family History Family History  Problem Relation Age of Onset  . Hypertension Mother   . Hypertension Father   . Colon  cancer Neg Hx     Social History Social History   Tobacco Use  . Smoking status: Former Smoker    Packs/day: 1.00    Years: 8.00    Pack years: 8.00    Types: Cigarettes  . Smokeless tobacco: Never Used  . Tobacco comment: e-cig  Substance Use Topics  . Alcohol use: Yes    Alcohol/week: 0.0 standard drinks    Comment: occ  . Drug use: Not Currently    Types: Marijuana     Allergies   Lactose intolerance (gi) and Flagyl [metronidazole]   Review of Systems Review of Systems  Gastrointestinal: Positive for diarrhea.  All other systems reviewed and are negative.    Physical Exam Updated Vital Signs BP 119/76 (BP Location: Right Arm)   Pulse 76   Temp 98.6 F (37 C) (Oral)   Ht 5' 2"  (1.575 m)   Wt 59 kg   LMP 02/17/2019   SpO2 100%   BMI 23.78 kg/m    Physical Exam Vitals signs and nursing note reviewed.  Constitutional:      General: She is not in acute distress.    Appearance: She is well-developed.  HENT:     Head: Atraumatic.  Eyes:     Conjunctiva/sclera: Conjunctivae normal.  Neck:     Musculoskeletal: Neck supple.  Cardiovascular:     Rate and Rhythm: Normal rate and regular rhythm.     Pulses: Normal pulses.     Heart sounds: Normal heart sounds.  Pulmonary:     Effort: Pulmonary effort is normal.     Breath sounds: Normal breath sounds.  Abdominal:     Palpations: Abdomen is soft.     Tenderness: There is no abdominal tenderness.  Skin:    Findings: No rash.  Neurological:     Mental Status: She is alert.  Psychiatric:        Mood and Affect: Mood normal.      ED Treatments / Results  Labs (all labs ordered are listed, but only abnormal results are displayed) Labs Reviewed  CBC - Abnormal; Notable for the following components:      Result Value   Hemoglobin 11.0 (*)    HCT 34.9 (*)    RDW 15.6 (*)    All other components within normal limits  LIPASE, BLOOD  COMPREHENSIVE METABOLIC PANEL  URINALYSIS, ROUTINE W REFLEX MICROSCOPIC  I-STAT BETA HCG BLOOD, ED ([facility], [facility], [facility] ONLY)    EKG None  Radiology No results found.  Procedures Procedures (including critical care time)  Medications Ordered in ED Medications  sodium chloride flush (NS) 0.9 % injection 3 mL (3 mLs Intravenous Given 02/21/19 1100)  sodium chloride 0.9 % bolus 1,000 mL (0 mLs Intravenous Stopped 02/21/19 1327)     Initial Impression / Assessment and Plan / ED Course  I have reviewed the triage vital signs and the nursing notes.  Pertinent labs & imaging results that were available during my care of the patient were reviewed by me and considered in my medical decision making (see chart for details).        BP 119/76 (BP Location: Right Arm)   Pulse 76   Temp 98.6 F (37 C) (Oral)   Ht 5' 2"  (1.575 m)   Wt 59 kg   LMP  02/17/2019   SpO2 100%   BMI 23.78 kg/m    Final Clinical Impressions(s) / ED Diagnoses   Final diagnoses:  Diarrhea, unspecified type    ED  Discharge Orders    None     11:09 AM Patient here with persistent diarrhea for the past 2 days days.  Abdomen is soft nontender on exam.  Patient is otherwise well-appearing.  History of IBS and report no Crohn's disease despite it being documented in her chart.  Will check labs and treat sxs. Doubt COVID-19  BO ROGUE was evaluated in Emergency Department on 02/21/2019 for the symptoms described in the history of present illness. She was evaluated in the context of the global COVID-19 pandemic, which necessitated consideration that the patient might be at risk for infection with the SARS-CoV-2 virus that causes COVID-19. Institutional protocols and algorithms that pertain to the evaluation of patients at risk for COVID-19 are in a state of rapid change based on information released by regulatory bodies including the CDC and federal and state organizations. These policies and algorithms were followed during the patient's care in the ED.  1:35 PM Labs are reassuring.  Patient felt much better after receiving IV fluid.  She tolerates p.o.  She has an appetite.  Stable for discharge with return cautions discussed.   Domenic Moras, PA-C 02/21/19 [place], Wenda Overland, MD 02/23/19 [phone]

## 2019-02-21 NOTE — ED Triage Notes (Signed)
Patient reports diarrhea and abdominal pain since 02/19/19. Patientreports a history of IBS. Patient states, "I need IV fluids."

## 2019-03-13 ENCOUNTER — Encounter (HOSPITAL_COMMUNITY): Payer: Self-pay | Admitting: Emergency Medicine

## 2019-03-13 ENCOUNTER — Emergency Department (HOSPITAL_COMMUNITY): Payer: Medicaid Other

## 2019-03-13 ENCOUNTER — Emergency Department (HOSPITAL_COMMUNITY)
Admission: EM | Admit: 2019-03-13 | Discharge: 2019-03-14 | Disposition: A | Discharge status: Home / Self Care | Payer: Medicaid Other | Attending: Emergency Medicine | Admitting: Emergency Medicine

## 2019-03-13 ENCOUNTER — Other Ambulatory Visit: Payer: Self-pay

## 2019-03-13 DIAGNOSIS — F1210 Cannabis abuse, uncomplicated: Secondary | ICD-10-CM | POA: Insufficient documentation

## 2019-03-13 DIAGNOSIS — Z79899 Other long term (current) drug therapy: Secondary | ICD-10-CM | POA: Insufficient documentation

## 2019-03-13 DIAGNOSIS — R0789 Other chest pain: Secondary | ICD-10-CM | POA: Insufficient documentation

## 2019-03-13 DIAGNOSIS — R079 Chest pain, unspecified: Secondary | ICD-10-CM | POA: Diagnosis present

## 2019-03-13 DIAGNOSIS — K5000 Crohn's disease of small intestine without complications: Secondary | ICD-10-CM | POA: Diagnosis not present

## 2019-03-13 DIAGNOSIS — Z87891 Personal history of nicotine dependence: Secondary | ICD-10-CM | POA: Diagnosis not present

## 2019-03-13 DIAGNOSIS — I10 Essential (primary) hypertension: Secondary | ICD-10-CM | POA: Diagnosis not present

## 2019-03-13 LAB — BASIC METABOLIC PANEL
Anion gap: 10 (ref 5–15)
BUN: 9 mg/dL (ref 6–20)
CO2: 24 mmol/L (ref 22–32)
Calcium: 9.3 mg/dL (ref 8.9–10.3)
Chloride: 104 mmol/L (ref 98–111)
Creatinine, Ser: 0.87 mg/dL (ref 0.44–1.00)
GFR calc Af Amer: >60 mL/min (ref >60)
GFR calc non Af Amer: >60 mL/min (ref >60)
Glucose, Bld: 88 mg/dL (ref 70–99)
Potassium: 3.3 mmol/L — ABNORMAL LOW (ref 3.5–5.1)
Sodium: 138 mmol/L (ref 135–145)

## 2019-03-13 LAB — HEPATIC FUNCTION PANEL
ALT: 14 U/L (ref 0–44)
AST: 17 U/L (ref 15–41)
Albumin: 3.6 g/dL (ref 3.5–5.0)
Alkaline Phosphatase: 84 U/L (ref 38–126)
Bilirubin, Direct: 0.1 mg/dL (ref 0.0–0.2)
Indirect Bilirubin: 0.4 mg/dL (ref 0.3–0.9)
Total Bilirubin: 0.5 mg/dL (ref 0.3–1.2)
Total Protein: 7.5 g/dL (ref 6.5–8.1)

## 2019-03-13 LAB — CBC
HCT: 35.4 % — ABNORMAL LOW (ref 36.0–46.0)
Hemoglobin: 11.1 g/dL — ABNORMAL LOW (ref 12.0–15.0)
MCH: 26.2 pg (ref 26.0–34.0)
MCHC: 31.4 g/dL (ref 30.0–36.0)
MCV: 83.7 fL (ref 80.0–100.0)
Platelets: 359 10*3/uL (ref 150–400)
RBC: 4.23 MIL/uL (ref 3.87–5.11)
RDW: 14.6 % (ref 11.5–15.5)
WBC: 7.1 10*3/uL (ref 4.0–10.5)
nRBC: 0 % (ref 0.0–0.2)

## 2019-03-13 LAB — I-STAT BETA HCG BLOOD, ED (NOT ORDERABLE): I-stat hCG, quantitative: <5 m[IU]/mL (ref <5)

## 2019-03-13 LAB — TROPONIN I (HIGH SENSITIVITY)
Troponin I (High Sensitivity): 2 ng/L (ref <18)
Troponin I (High Sensitivity): 2 ng/L (ref <18)

## 2019-03-13 LAB — LIPASE, BLOOD: Lipase: 27 U/L (ref 11–51)

## 2019-03-13 MED ORDER — LIDOCAINE VISCOUS HCL 2 % MT SOLN
15.0000 mL | Freq: Once | OROMUCOSAL | Status: AC
  Administered 2019-03-13: 21:00:00 15 mL via ORAL
  Filled 2019-03-13: qty 15

## 2019-03-13 MED ORDER — IOHEXOL 300 MG/ML  SOLN
100.0000 mL | Freq: Once | INTRAMUSCULAR | Status: AC | PRN
  Administered 2019-03-13: 23:00:00 100 mL via INTRAVENOUS

## 2019-03-13 MED ORDER — OXYCODONE-ACETAMINOPHEN 5-325 MG PO TABS: 1.0000 | ORAL_TABLET | Freq: Four times a day (QID) | ORAL | 0 refills | Status: DC | PRN

## 2019-03-13 MED ORDER — ALUM & MAG HYDROXIDE-SIMETH 200-200-20 MG/5ML PO SUSP
30.0000 mL | Freq: Once | ORAL | Status: AC
  Administered 2019-03-13: 21:00:00 30 mL via ORAL
  Filled 2019-03-13: qty 30

## 2019-03-13 MED ORDER — OXYCODONE-ACETAMINOPHEN 5-325 MG PO TABS
1.0000 | ORAL_TABLET | Freq: Once | ORAL | Status: AC
  Administered 2019-03-14: 1 via ORAL
  Filled 2019-03-13: qty 1

## 2019-03-13 MED ORDER — SUCRALFATE 1 G PO TABS: 1.0000 g | ORAL_TABLET | Freq: Three times a day (TID) | ORAL | 1 refills | Status: DC

## 2019-03-13 MED ORDER — ONDANSETRON HCL 4 MG/2ML IJ SOLN
4.0000 mg | Freq: Once | INTRAMUSCULAR | Status: AC
  Administered 2019-03-13: 4 mg via INTRAVENOUS
  Filled 2019-03-13: qty 2

## 2019-03-13 MED ORDER — HYDROMORPHONE HCL 1 MG/ML IJ SOLN
1.0000 mg | Freq: Once | INTRAMUSCULAR | Status: AC
  Administered 2019-03-13: 1 mg via INTRAVENOUS
  Filled 2019-03-13: qty 1

## 2019-03-13 MED ORDER — SUCRALFATE 1 G PO TABS
1.0000 g | ORAL_TABLET | Freq: Once | ORAL | Status: AC
  Administered 2019-03-14: 1 g via ORAL
  Filled 2019-03-13: qty 1

## 2019-03-13 MED ORDER — SODIUM CHLORIDE 0.9% FLUSH: 3.0000 mL | Freq: Once | INTRAVENOUS | Status: DC

## 2019-03-13 MED ORDER — MORPHINE SULFATE (PF) 4 MG/ML IV SOLN
4.0000 mg | Freq: Once | INTRAVENOUS | Status: AC
  Administered 2019-03-13: 4 mg via INTRAVENOUS
  Filled 2019-03-13: qty 1

## 2019-03-13 NOTE — ED Provider Notes (Signed)
[facility] DEPT Provider Note   CSN: [id number] Arrival date & time: 03/13/19  1928     History   Chief Complaint Chief Complaint  Patient presents with   Chest Pain    HPI Sharon Clayton is a 28 y.o. female.     The history is provided by the patient.  Chest Pain Pain location:  Epigastric Pain quality: aching, sharp and shooting   Pain radiates to:  Mid back Pain severity:  Severe Onset quality:  Gradual Duration:  1 day Timing:  Constant Progression:  Worsening Chronicity:  Recurrent Context: breathing and eating   Relieved by:  Certain positions and leaning forward Worsened by:  Deep breathing, coughing and movement Ineffective treatments:  Antacids (maalox and protonix) Associated symptoms: abdominal pain, anorexia, back pain, heartburn, nausea, palpitations and shortness of breath   Associated symptoms: no cough, no diaphoresis, no fever, no headache, no lower extremity edema, no vomiting and no weakness   Risk factors comment:  Hx of prior crohn's, GERD, IBS   Past Medical History:  Diagnosis Date   Bacterial infection 09/10/11   Crohn's disease ([place])    H/O varicella 09/10/11   Hypotension    IBS (irritable bowel syndrome)    Low iron 09/10/11   Trichomonas 09/10/10   Yeast infection 09/10/11    Patient Active Problem List   Diagnosis Date Noted   Bartholin cyst 07/16/2018   Crohn's disease with complication ([place]) 30/16/0109   S/P cesarean section 09/04/2013   Smoker 06/08/2013   IBS (irritable bowel syndrome) 06/08/2013   Uses marijuana 04/13/2013    Past Surgical History:  Procedure Laterality Date   CESAREAN SECTION  09/10/11   NRFHT - primary low transverse cesarean section    CESAREAN SECTION N/A 09/04/2013   Procedure: CESAREAN SECTION- repeat;  Surgeon: Osborne Oman, MD;  Location: [facility];  Service: Obstetrics;  Laterality: N/A;     OB History    Gravida  4   Para  2   Term  2   Preterm   0   AB  2   Living  2     SAB  1   TAB  1   Ectopic  0   Multiple  0   Live Births  2            Home Medications    Prior to Admission medications   Medication Sig Start Date End Date Taking? Authorizing Provider  Bismuth Subsalicylate (PEPTO-BISMOL MAX STRENGTH) 525 MG/15ML SUSP Take 30 mLs by mouth every 30 (thirty) minutes as needed (upset stomach, nausea).   Yes [provider]  cetirizine (ZYRTEC) 10 MG tablet Take 10 mg by mouth daily as needed for allergies.   Yes [provider]  dicyclomine (BENTYL) 10 MG/5ML syrup Take 5 mLs by mouth 3 (three) times daily. 12/07/18  Yes [provider]  ibuprofen (ADVIL,MOTRIN) 200 MG tablet Take 200 mg by mouth every 6 (six) hours as needed for moderate pain.   Yes [provider]  Multiple Vitamin (MULTIVITAMIN) tablet Take 1 tablet by mouth daily.   Yes [provider]  pantoprazole (PROTONIX) 40 MG tablet Take 40 mg by mouth daily. 11/19/18  Yes [provider]  Simethicone (PHAZYME MAXIMUM STRENGTH) 250 MG CAPS Take 250 mg by mouth daily as needed (gas).   Yes [provider]    Family History Family History  Problem Relation Age of Onset   Hypertension Mother  Hypertension Father    Colon cancer Neg Hx     Social History Social History   Tobacco Use   Smoking status: Former Smoker    Packs/day: 1.00    Years: 8.00    Pack years: 8.00    Types: Cigarettes   Smokeless tobacco: Never Used   Tobacco comment: e-cig  Substance Use Topics   Alcohol use: Yes    Alcohol/week: 0.0 standard drinks    Comment: occ   Drug use: Not Currently    Types: Marijuana     Allergies   Lactose intolerance (gi) and Flagyl [metronidazole]   Review of Systems Review of Systems  Constitutional: Negative for diaphoresis and fever.  Respiratory: Positive for shortness of breath. Negative for cough.   Cardiovascular: Positive for chest pain and  palpitations.  Gastrointestinal: Positive for abdominal pain, anorexia, heartburn and nausea. Negative for vomiting.  Musculoskeletal: Positive for back pain.  Neurological: Negative for weakness and headaches.  All other systems reviewed and are negative.    Physical Exam Updated Vital Signs BP 114/80 (BP Location: Right Arm)    Pulse 96    Temp 98.7 F (37.1 C)    Resp (!) 24    LMP 02/16/2019    SpO2 100%   Physical Exam Vitals signs and nursing note reviewed.  Constitutional:      General: She is in acute distress.     Appearance: She is well-developed and normal weight.     Comments: Appears uncomfortable  HENT:     Head: Normocephalic and atraumatic.     Mouth/Throat:     Mouth: Mucous membranes are moist.  Eyes:     Extraocular Movements: Extraocular movements intact.     Pupils: Pupils are equal, round, and reactive to light.  Neck:     Musculoskeletal: Normal range of motion.  Cardiovascular:     Rate and Rhythm: Normal rate and regular rhythm.     Heart sounds: Normal heart sounds. No murmur. No friction rub.  Pulmonary:     Effort: Pulmonary effort is normal.     Breath sounds: Normal breath sounds. No wheezing or rales.  Abdominal:     General: Bowel sounds are normal. There is no distension.     Palpations: Abdomen is soft.     Tenderness: There is abdominal tenderness in the epigastric area. There is no guarding or rebound.  Musculoskeletal: Normal range of motion.        General: No tenderness.     Right lower leg: She exhibits no tenderness.     Left lower leg: She exhibits no tenderness.     Comments: No edema  Skin:    General: Skin is warm and dry.     Capillary Refill: Capillary refill takes less than 2 seconds.     Findings: No rash.  Neurological:     General: No focal deficit present.     Mental Status: She is alert and oriented to person, place, and time.     Cranial Nerves: No cranial nerve deficit.  Psychiatric:        Mood and Affect:  Mood normal.        Behavior: Behavior normal.        Thought Content: Thought content normal.      ED Treatments / Results  Labs (all labs ordered are listed, but only abnormal results are displayed) Labs Reviewed  BASIC METABOLIC PANEL - Abnormal; Notable for the following components:  Result Value   Potassium 3.3 (*)    All other components within normal limits  CBC - Abnormal; Notable for the following components:   Hemoglobin 11.1 (*)    HCT 35.4 (*)    All other components within normal limits  HEPATIC FUNCTION PANEL  LIPASE, BLOOD  I-STAT BETA HCG BLOOD, ED ([facility], [facility], [facility] ONLY)  I-STAT BETA HCG BLOOD, ED (NOT ORDERABLE)  TROPONIN I (HIGH SENSITIVITY)  TROPONIN I (HIGH SENSITIVITY)    EKG EKG Interpretation  Date/Time:  Saturday March 13 2019 19:40:21 EDT Ventricular Rate:  90 PR Interval:    QRS Duration: 79 QT Interval:  346 QTC Calculation: 424 R Axis:   78 Text Interpretation:  Sinus rhythm Probable left atrial enlargement No significant change since last tracing Confirmed by Blanchie Dessert [phone]) on 03/13/2019 9:01:19 PM   Radiology Dg Chest 2 View  Result Date: 03/13/2019 CLINICAL DATA:  Chest pain EXAM: CHEST - 2 VIEW COMPARISON:  02/08/2016 chest radiograph. FINDINGS: Stable cardiomediastinal silhouette with normal heart size. No pneumothorax. No pleural effusion. Lungs appear clear, with no acute consolidative airspace disease and no pulmonary edema. IMPRESSION: No active cardiopulmonary disease. Electronically Signed   By: Ilona Sorrel M.D.   On: 03/13/2019 20:59   Ct Abdomen Pelvis W Contrast  Result Date: 03/13/2019 CLINICAL DATA:  Chest pain radiating to the back EXAM: CT ABDOMEN AND PELVIS WITH CONTRAST TECHNIQUE: Multidetector CT imaging of the abdomen and pelvis was performed using the standard protocol following bolus administration of intravenous contrast. CONTRAST:  161m OMNIPAQUE IOHEXOL 300 MG/ML  SOLN COMPARISON:  CT 03/31/2018  FINDINGS: Lower chest: Lung bases demonstrate no acute consolidation or effusion. The heart size is within normal limits. Hepatobiliary: No focal liver abnormality is seen. No gallstones, gallbladder wall thickening, or biliary dilatation. Pancreas: Unremarkable. No pancreatic ductal dilatation or surrounding inflammatory changes. Spleen: Normal in size without focal abnormality. Adrenals/Urinary Tract: Adrenal glands are unremarkable. Kidneys show no hydronephrosis. Cyst within the mid to lower left kidney. Increased density within the renal collecting systems, likely early excretion of contrast though there may be a stone in the upper pole of the right kidney. Stomach/Bowel: The stomach is nonenlarged. No dilated small bowel. Mild wall thickening and mucosal enhancement involving distal small bowel loops/terminal ileum within the pelvis. Negative appendix. Fluid in the colon without colon wall thickening. Vascular/Lymphatic: No significant vascular findings are present. No enlarged abdominal or pelvic lymph nodes. Reproductive: Uterus and bilateral adnexa are unremarkable. Other: Negative for free air or free fluid. Metal ornamentation at the mons pubis. 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 Mildly thickened terminal ileum with mucosal enhancement consistent with ileitis, likely related to the history of inflammatory bowel disease. No evidence for obstruction or perforation. 2. 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 3. Possible right kidney stone Electronically Signed   By: KDonavan FoilM.D.   On: 03/13/2019 23:13    Procedures Procedures (including critical care time)  Medications Ordered in ED Medications  sodium chloride flush (NS) 0.9 % injection 3 mL (has no administration in time range)  morphine 4 MG/ML injection 4 mg (has no administration in time range)  ondansetron (ZOFRAN) injection 4 mg (has no administration  in time range)  alum & mag hydroxide-simeth (MAALOX/MYLANTA) 200-200-20 MG/5ML suspension 30 mL (has no administration in time range)    And  lidocaine (XYLOCAINE) 2 % viscous mouth solution 15 mL (has  no administration in time range)     Initial Impression / Assessment and Plan / ED Course  I have reviewed the triage vital signs and the nursing notes.  Pertinent labs & imaging results that were available during my care of the patient were reviewed by me and considered in my medical decision making (see chart for details).        Patient is a 28 year old female with a history of IBS and Crohn's presenting today with pain in her epigastric area and lower chest area that radiates straight back into her back and is worsened over the last 1 day.  Patient states now it is severe when she attempts to take a deep breath and certain movements.  She tried taking her Protonix and Maalox but states it did not help.  She has had some nausea but no vomiting.  Yesterday she said she had some reflux and throat pain but that is gone.  She had a normal bowel movement yesterday.  She has not had anything to eat today.  She denies fever, cough or congestion.  Patient has had symptoms like this before but states it has been a while.  Patient did have a CT approximately 1 year ago that showed proximal dilation of the small bowel with probable underlying stricture or dysmotility.  Patient seems to think that her Crohn's disease is gone away and she only has IBS at this time but CT scans would show otherwise.  Patient's EKG today is within normal limits and low suspicion that this is a cardiac condition.  Patient given GI cocktail and pain control.  Labs are still pending but patient may need CT for further evaluation of her abdomen to rule out obstruction.  10:32 PM Patient's labs without significant issues however she is still very uncomfortable.  She was given further pain medicine and CT pending  11:25 PM CT  showed mildly thickened terminal ileum with mucosal enhancement consistent with ileitis.  No evidence of obstruction or perforation at this time.  Feel that this is most likely the cause of the patient's pain.  On reevaluation she is feeling much better.  She is laughing and talking with her husband.  Will attempt p.o. challenge.  12:00 AM Patient is able to drink some ginger ale but states she has some mild pain when doing so.  Offer patient longer observation and further pain control but she says she would like to go home.  Will give Carafate and pain medicine.  Patient given strict return precautions. Final Clinical Impressions(s) / ED Diagnoses   Final diagnoses:  Terminal ileitis without complication ([place])  Atypical chest pain    ED Discharge Orders         Ordered    oxyCODONE-acetaminophen (PERCOCET/ROXICET) 5-325 MG tablet  Every 6 hours PRN     03/13/19 2358    sucralfate (CARAFATE) 1 g tablet  3 times daily with meals & bedtime     03/13/19 2358           Blanchie Dessert, MD 03/14/19 0001

## 2019-03-13 NOTE — ED Triage Notes (Signed)
Pt reports having chest pain that started this morning with radiation to back.

## 2019-03-13 NOTE — ED Notes (Signed)
Lab made aware of order add on

## 2019-03-13 NOTE — ED Notes (Signed)
Patient transported to CT 

## 2019-03-13 NOTE — ED Notes (Signed)
ED Provider at bedside. 

## 2019-03-14 NOTE — Discharge Instructions (Signed)
Continue to take your pantoprazole for your stomach and the medications we gave you tonight.  Eat a bland diet and stay away from trigger foods.  If you start feeling like you may be constipated make sure you take something for constipation.  Please return if the pain gets worse or you start developing vomiting or fever.

## 2019-09-02 DIAGNOSIS — E559 Vitamin D deficiency, unspecified: Secondary | ICD-10-CM | POA: Insufficient documentation

## 2019-09-02 DIAGNOSIS — D649 Anemia, unspecified: Secondary | ICD-10-CM | POA: Insufficient documentation

## 2019-09-30 DIAGNOSIS — F418 Other specified anxiety disorders: Secondary | ICD-10-CM | POA: Insufficient documentation

## 2020-09-30 ENCOUNTER — Emergency Department (HOSPITAL_COMMUNITY)
Admission: EM | Admit: 2020-09-30 | Discharge: 2020-09-30 | Disposition: A | Discharge status: Home / Self Care | Payer: Medicaid Other | Attending: Emergency Medicine | Admitting: Emergency Medicine

## 2020-09-30 ENCOUNTER — Emergency Department (HOSPITAL_COMMUNITY): Payer: Medicaid Other

## 2020-09-30 ENCOUNTER — Encounter (HOSPITAL_COMMUNITY): Payer: Self-pay

## 2020-09-30 DIAGNOSIS — K5010 Crohn's disease of large intestine without complications: Secondary | ICD-10-CM | POA: Diagnosis not present

## 2020-09-30 DIAGNOSIS — Z87891 Personal history of nicotine dependence: Secondary | ICD-10-CM | POA: Diagnosis not present

## 2020-09-30 DIAGNOSIS — R109 Unspecified abdominal pain: Secondary | ICD-10-CM | POA: Diagnosis present

## 2020-09-30 LAB — URINALYSIS, ROUTINE W REFLEX MICROSCOPIC
Bilirubin Urine: NEGATIVE
Glucose, UA: NEGATIVE mg/dL
Hgb urine dipstick: NEGATIVE
Ketones, ur: NEGATIVE mg/dL
Leukocytes,Ua: NEGATIVE
Nitrite: NEGATIVE
Protein, ur: NEGATIVE mg/dL
Specific Gravity, Urine: 1.02 (ref 1.005–1.030)
pH: 7 (ref 5.0–8.0)

## 2020-09-30 LAB — COMPREHENSIVE METABOLIC PANEL
ALT: 9 U/L (ref 0–44)
AST: 14 U/L — ABNORMAL LOW (ref 15–41)
Albumin: 3.3 g/dL — ABNORMAL LOW (ref 3.5–5.0)
Alkaline Phosphatase: 84 U/L (ref 38–126)
Anion gap: 5 (ref 5–15)
BUN: 9 mg/dL (ref 6–20)
CO2: 24 mmol/L (ref 22–32)
Calcium: 8.7 mg/dL — ABNORMAL LOW (ref 8.9–10.3)
Chloride: 109 mmol/L (ref 98–111)
Creatinine, Ser: 0.82 mg/dL (ref 0.44–1.00)
GFR, Estimated: >60 mL/min (ref >60)
Glucose, Bld: 83 mg/dL (ref 70–99)
Potassium: 3.7 mmol/L (ref 3.5–5.1)
Sodium: 138 mmol/L (ref 135–145)
Total Bilirubin: 0.5 mg/dL (ref 0.3–1.2)
Total Protein: 7.5 g/dL (ref 6.5–8.1)

## 2020-09-30 LAB — CBC
HCT: 33.7 % — ABNORMAL LOW (ref 36.0–46.0)
Hemoglobin: 10.8 g/dL — ABNORMAL LOW (ref 12.0–15.0)
MCH: 26 pg (ref 26.0–34.0)
MCHC: 32 g/dL (ref 30.0–36.0)
MCV: 81.2 fL (ref 80.0–100.0)
Platelets: 368 10*3/uL (ref 150–400)
RBC: 4.15 MIL/uL (ref 3.87–5.11)
RDW: 13.9 % (ref 11.5–15.5)
WBC: 8.3 10*3/uL (ref 4.0–10.5)
nRBC: 0 % (ref 0.0–0.2)

## 2020-09-30 LAB — PREGNANCY, URINE: Preg Test, Ur: NEGATIVE

## 2020-09-30 LAB — LIPASE, BLOOD: Lipase: 30 U/L (ref 11–51)

## 2020-09-30 MED ORDER — MORPHINE SULFATE (PF) 2 MG/ML IV SOLN
2.0000 mg | Freq: Once | INTRAVENOUS | Status: AC
  Administered 2020-09-30: 2 mg via INTRAVENOUS
  Filled 2020-09-30: qty 1

## 2020-09-30 MED ORDER — DICYCLOMINE HCL 10 MG PO CAPS
10.0000 mg | ORAL_CAPSULE | Freq: Once | ORAL | Status: AC
  Administered 2020-09-30: 10 mg via ORAL
  Filled 2020-09-30: qty 1

## 2020-09-30 MED ORDER — TRAMADOL HCL 50 MG PO TABS: 50.0000 mg | ORAL_TABLET | Freq: Four times a day (QID) | ORAL | 0 refills | Status: AC | PRN

## 2020-09-30 MED ORDER — SODIUM CHLORIDE 0.9 % IV BOLUS
1000.0000 mL | Freq: Once | INTRAVENOUS | Status: AC
  Administered 2020-09-30: 1000 mL via INTRAVENOUS

## 2020-09-30 MED ORDER — IOHEXOL 300 MG/ML  SOLN
100.0000 mL | Freq: Once | INTRAMUSCULAR | Status: AC | PRN
  Administered 2020-09-30: 100 mL via INTRAVENOUS

## 2020-09-30 NOTE — Discharge Instructions (Addendum)
You were evaluated in the Emergency Department and after careful evaluation, we did not find any emergent condition requiring admission or further testing in the hospital.   You may take Tylenol or tramadol for your pain.  Please use Bentyl.  It is very important that you follow-up with a GI doctor.  Return to the ER for any new or worsening symptoms.   Please return to the Emergency Department if you experience any worsening of your condition.  We encourage you to follow up with a primary care provider.  Thank you for allowing [place] to be a part of your care.

## 2020-09-30 NOTE — ED Provider Notes (Signed)
[facility] DEPT Provider Note   CSN: [id number] Arrival date & time: 09/30/20  1202     History Chief Complaint  Patient presents with  . Abdominal Pain    Sharon Clayton is a 30 y.o. female.  HPI 30 year old female with a history of Crohn's disease, hypertension, IBS presents to the ER with complaints of abdominal pain, watery nonbloody diarrhea x1 week.  States that the pain is progressively getting worse.  She states that she has been managing her Crohn's disease with dietary changes, and does not take any other medications for this.  She does not have a dedicated GI doctor, states that she has been seen by the [facility] and [facility].  She denies any nausea or vomiting.  No fevers or chills.  No dysuria or hematuria.  No flank tenderness.    Past Medical History:  Diagnosis Date  . Bacterial infection 09/10/11  . Crohn's disease ([place])   . H/O varicella 09/10/11  . Hypotension   . IBS (irritable bowel syndrome)   . Low iron 09/10/11  . Trichomonas 09/10/10  . Yeast infection 09/10/11    Patient Active Problem List   Diagnosis Date Noted  . Bartholin cyst 07/16/2018  . Crohn's disease with complication ([place]) 08/67/6195  . S/P cesarean section 09/04/2013  . Smoker 06/08/2013  . IBS (irritable bowel syndrome) 06/08/2013  . Uses marijuana 04/13/2013    Past Surgical History:  Procedure Laterality Date  . CESAREAN SECTION  09/10/11   NRFHT - primary low transverse cesarean section   . CESAREAN SECTION N/A 09/04/2013   Procedure: CESAREAN SECTION- repeat;  Surgeon: Osborne Oman, MD;  Location: [facility];  Service: Obstetrics;  Laterality: N/A;     OB History    Gravida  4   Para  2   Term  2   Preterm  0   AB  2   Living  2     SAB  1   IAB  1   Ectopic  0   Multiple  0   Live Births  2           Family History  Problem Relation Age of Onset  . Hypertension Mother   . Hypertension Father   . Colon  cancer Neg Hx     Social History   Tobacco Use  . Smoking status: Former Smoker    Packs/day: 1.00    Years: 8.00    Pack years: 8.00    Types: Cigarettes  . Smokeless tobacco: Never Used  . Tobacco comment: e-cig  Vaping Use  . Vaping Use: Never used  Substance Use Topics  . Alcohol use: Yes    Alcohol/week: 0.0 standard drinks    Comment: occ  . Drug use: Not Currently    Types: Marijuana    Home Medications Prior to Admission medications   Medication Sig Start Date End Date Taking? Authorizing Provider  Bismuth Subsalicylate (PEPTO-BISMOL MAX STRENGTH) 525 MG/15ML SUSP Take 30 mLs by mouth every 30 (thirty) minutes as needed (upset stomach, nausea).   Yes [provider]  cetirizine (ZYRTEC) 10 MG tablet Take 10 mg by mouth daily as needed for allergies.   Yes [provider]  dicyclomine (BENTYL) 10 MG/5ML syrup Take 5 mLs by mouth in the morning and at bedtime. 12/07/18  Yes [provider]  ibuprofen (ADVIL,MOTRIN) 200 MG tablet Take 200 mg by mouth every 6 (six) hours as needed for moderate pain.  Yes [provider]  Multiple Vitamin (MULTIVITAMIN) tablet Take 1 tablet by mouth daily.   Yes [provider]  Simethicone 250 MG CAPS Take 250 mg by mouth daily as needed (gas).   Yes [provider]  traMADol (ULTRAM) 50 MG tablet Take 1 tablet (50 mg total) by mouth every 6 (six) hours as needed for up to 5 days. 09/30/20 10/05/20 Yes Garald Balding, PA-C  oxyCODONE-acetaminophen (PERCOCET/ROXICET) 5-325 MG tablet Take 1-2 tablets by mouth every 6 (six) hours as needed for severe pain. Patient not taking: No sig reported 03/13/19   Blanchie Dessert, MD  sucralfate (CARAFATE) 1 g tablet Take 1 tablet (1 g total) by mouth 4 (four) times daily -  with meals and at bedtime. Patient not taking: No sig reported 03/13/19   Blanchie Dessert, MD    Allergies    Lactose intolerance (gi) and Flagyl [metronidazole]  Review of  Systems   Review of Systems  Constitutional: Negative for chills and fever.  HENT: Negative for ear pain and sore throat.   Eyes: Negative for pain and visual disturbance.  Respiratory: Negative for cough and shortness of breath.   Cardiovascular: Negative for chest pain and palpitations.  Gastrointestinal: Positive for abdominal pain and diarrhea. Negative for vomiting.  Genitourinary: Negative for dysuria and hematuria.  Musculoskeletal: Negative for arthralgias and back pain.  Skin: Negative for color change and rash.  Neurological: Negative for seizures and syncope.  All other systems reviewed and are negative.   Physical Exam Updated Vital Signs BP 118/72   Pulse 66   Temp 98.4 F (36.9 C) (Oral)   Resp (!) 21   LMP 09/11/2020   SpO2 100%   Physical Exam Vitals and nursing note reviewed.  Constitutional:      General: She is not in acute distress.    Appearance: She is well-developed.  HENT:     Head: Normocephalic and atraumatic.  Eyes:     Conjunctiva/sclera: Conjunctivae normal.  Cardiovascular:     Rate and Rhythm: Normal rate and regular rhythm.     Heart sounds: No murmur heard.   Pulmonary:     Effort: Pulmonary effort is normal. No respiratory distress.     Breath sounds: Normal breath sounds.  Abdominal:     General: Abdomen is flat. Bowel sounds are normal.     Palpations: Abdomen is soft.     Tenderness: There is generalized abdominal tenderness. There is no guarding.     Hernia: No hernia is present.  Musculoskeletal:     Cervical back: Neck supple.  Skin:    General: Skin is warm and dry.  Neurological:     General: No focal deficit present.     Mental Status: She is alert.  Psychiatric:        Mood and Affect: Mood normal.        Behavior: Behavior normal.     ED Results / Procedures / Treatments   Labs (all labs ordered are listed, but only abnormal results are displayed) Labs Reviewed  CBC - Abnormal; Notable for the following  components:      Result Value   Hemoglobin 10.8 (*)    HCT 33.7 (*)    All other components within normal limits  COMPREHENSIVE METABOLIC PANEL - Abnormal; Notable for the following components:   Calcium 8.7 (*)    Albumin 3.3 (*)    AST 14 (*)    All other components within normal limits  LIPASE, BLOOD  URINALYSIS, ROUTINE W REFLEX MICROSCOPIC  PREGNANCY, URINE    EKG None  Radiology CT ABDOMEN PELVIS W CONTRAST  Result Date: 09/30/2020 CLINICAL DATA:  Abdominal pain.  History of Crohn's disease. EXAM: CT ABDOMEN AND PELVIS WITH CONTRAST TECHNIQUE: Multidetector CT imaging of the abdomen and pelvis was performed using the standard protocol following bolus administration of intravenous contrast. CONTRAST:  136m OMNIPAQUE IOHEXOL 300 MG/ML  SOLN COMPARISON:  CT scan 03/13/2019 FINDINGS: Lower chest: The lung bases are clear of acute process. No pleural effusion or pulmonary lesions. The heart is normal in size. No pericardial effusion. The distal esophagus and aorta are unremarkable. Hepatobiliary: No focal hepatic lesions or intrahepatic biliary dilatation. The gallbladder is normal. No common bile duct dilatation. Pancreas: No mass, inflammation or ductal dilatation. Spleen: Normal size.  No focal lesions. Adrenals/Urinary Tract: Adrenal glands and kidneys are unremarkable. The bladder is normal. Stomach/Bowel: The stomach, duodenum and proximal and mid small bowel are unremarkable. There is moderate wall thickening and mucosal enhancement of the distal ileum and terminal ileum consistent with active Crohn's disease. No findings for functional obstruction. The cecum is unremarkable. The appendix is normal. The remainder of the colon is unremarkable. Vascular/Lymphatic: The aorta is normal in caliber. No dissection. The branch vessels are patent. The major venous structures are patent. No mesenteric or retroperitoneal mass or adenopathy. Scattered borderline reactive lymph nodes are noted in  the small bowel mesentery. Reproductive: The uterus and ovaries are unremarkable. Other: Small amount of free pelvic fluid is likely physiologic. Musculoskeletal: No significant bony findings. There are stable bilateral pars defects at L5 and minimal anterolisthesis. IMPRESSION: 1. CT findings consistent with active Crohn's disease involving the distal ileum and terminal ileum. No complicating features such as fistula, abscess or functional obstruction. 2. No other significant abdominal/pelvic findings, mass lesions or adenopathy. 3. Stable bilateral pars defects at L5 and minimal anterolisthesis. Electronically Signed   By: PMarijo SanesM.D.   On: 09/30/2020 15:35    Procedures Procedures   Medications Ordered in ED Medications  sodium chloride 0.9 % bolus 1,000 mL (0 mLs Intravenous Stopped 09/30/20 1402)  morphine 2 MG/ML injection 2 mg (2 mg Intravenous Given 09/30/20 1256)  dicyclomine (BENTYL) capsule 10 mg (10 mg Oral Given 09/30/20 1632)  iohexol (OMNIPAQUE) 300 MG/ML solution 100 mL (100 mLs Intravenous Contrast Given 09/30/20 1504)  morphine 2 MG/ML injection 2 mg (2 mg Intravenous Given 09/30/20 1631)    ED Course  I have reviewed the triage vital signs and the nursing notes.  Pertinent labs & imaging results that were available during my care of the patient were reviewed by me and considered in my medical decision making (see chart for details).    MDM Rules/Calculators/A&P                         30year old female who presents to the ER with complaints of generalized abdominal pain.  History of Crohn's.  Reports watery diarrhea for the last week.  She had a colonoscopy done by Dr.Jacobs, but has not followed up with the GI recently.  [facility]  On arrival, her was overall reassuring.  She does have some generalized abdominal tenderness.  Patient is tearful on arrival.  No flank tenderness.  Abs and imaging ordered, reviewed and  interpreted by me.  CMP largely unremarkable, CBC without leukocytosis.  UA without evidence of UTI.  Lipase negative.  CT of the abdomen without any evidence of fistula, obstruction, toxic megacolon, or any other acute abnormality.  On reevaluation, patient does note improvement in pain after morphine.  Spoke with Dr. Alessandra Bevels with [facility] with recommendations for outpatient management.  He recommends Tylenol or tramadol.  PDMP reviewed, appropriate, prescribed tramadol.  I stressed that she needs to follow-up with a GI doctor.  He already has Bentyl at home.  She voiced understanding and is agreeable.  Stable for discharge.  Final Clinical Impression(s) / ED Diagnoses Final diagnoses:  Crohn's disease of colon without complication [facility])    Rx / DC Orders ED Discharge Orders         Ordered    traMADol (ULTRAM) 50 MG tablet  Every 6 hours PRN        09/30/20 1633           Lyndel Safe 09/30/20 1635    Daleen Bo, MD 09/30/20 1711

## 2020-09-30 NOTE — ED Triage Notes (Signed)
Pt arrived via POV, c/o abd pain and diarrhea x1 week. Recently dx with chron's disese.

## 2020-11-02 DIAGNOSIS — D509 Iron deficiency anemia, unspecified: Secondary | ICD-10-CM | POA: Insufficient documentation

## 2020-11-02 DIAGNOSIS — R87610 Atypical squamous cells of undetermined significance on cytologic smear of cervix (ASC-US): Secondary | ICD-10-CM | POA: Insufficient documentation

## 2020-11-19 IMAGING — CT CT ABD-PELV W/ CM
2 of 4 series · 16 of 46 positions shown, 18 images · IV contrast (omnipaque)
Comparison: CT 03/31/2018

CLINICAL DATA: Chest pain radiating to the back

EXAM:
CT ABDOMEN AND PELVIS WITH CONTRAST
TECHNIQUE: Multidetector CT imaging of the abdomen and pelvis was performed
using the standard protocol following bolus administration of
intravenous contrast.
CONTRAST:  100mL OMNIPAQUE IOHEXOL 300 MG/ML  SOLN

[Series 2: axial st · axial · 0.65mm/px · z∈[-405,-25]mm · 13 of 84 slices shown, 15 images]
[im 4/84  soft-tissue]
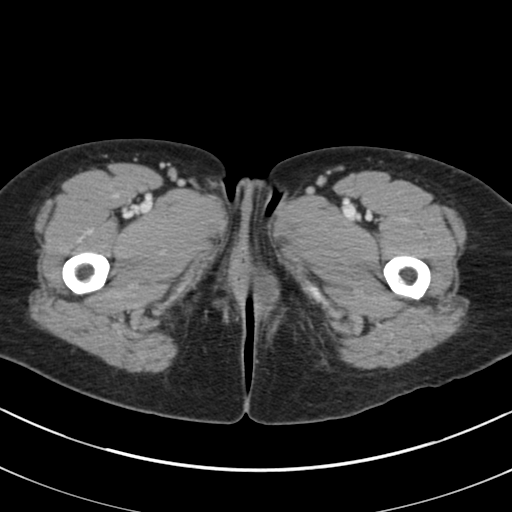
[im 4/84  bone]
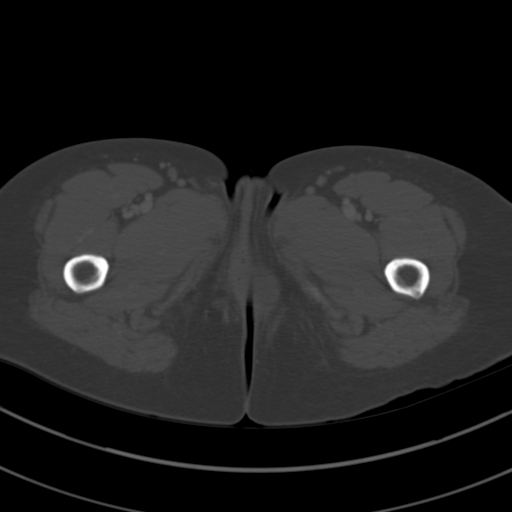
[im 12/84  soft-tissue]
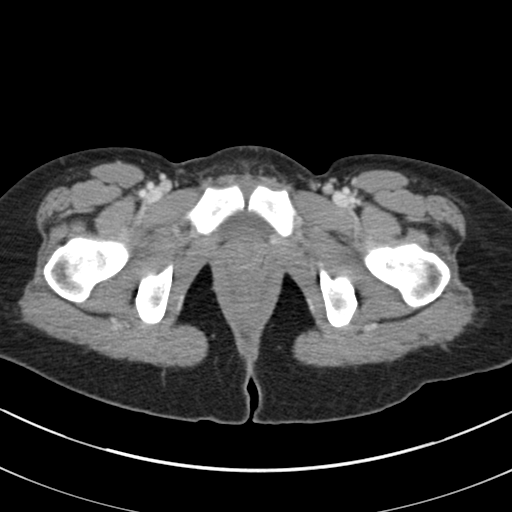
[im 16/84  soft-tissue]
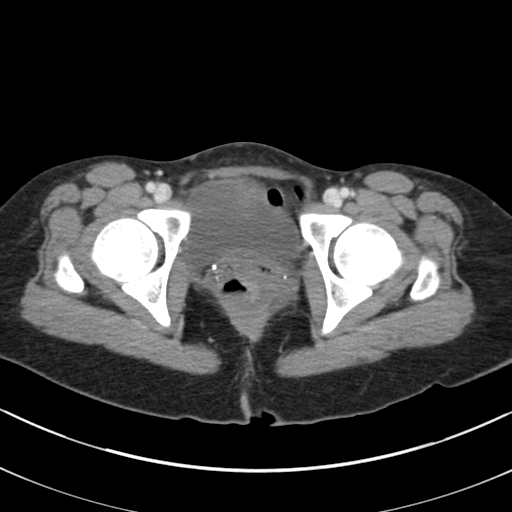
[im 24/84  soft-tissue]
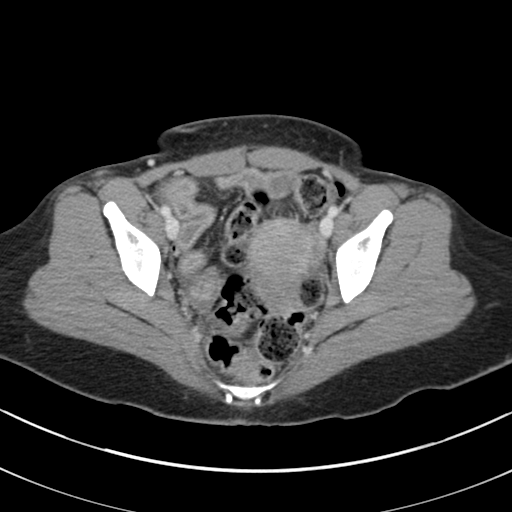
[im 28/84  soft-tissue]
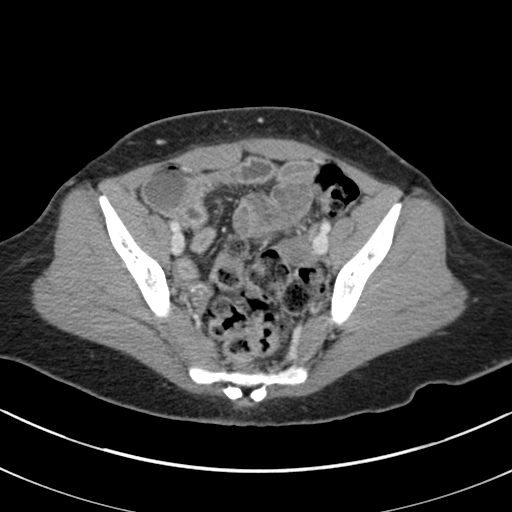
[im 36/84  soft-tissue]
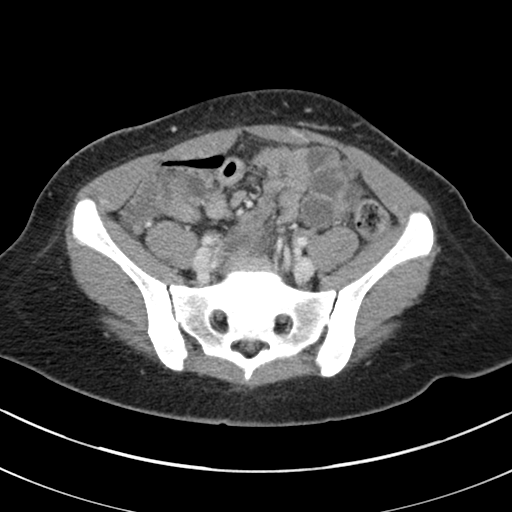
[im 44/84  soft-tissue]
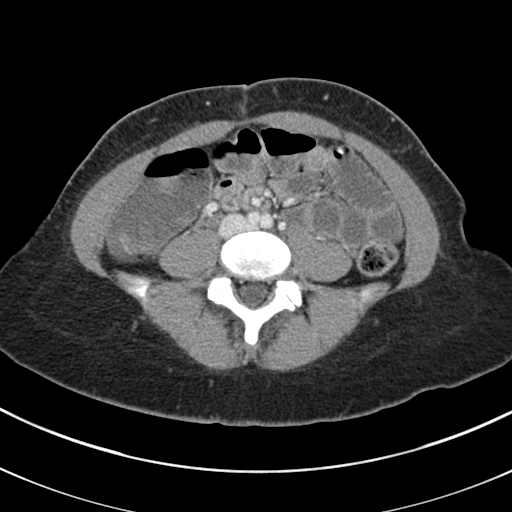
[im 48/84  soft-tissue]
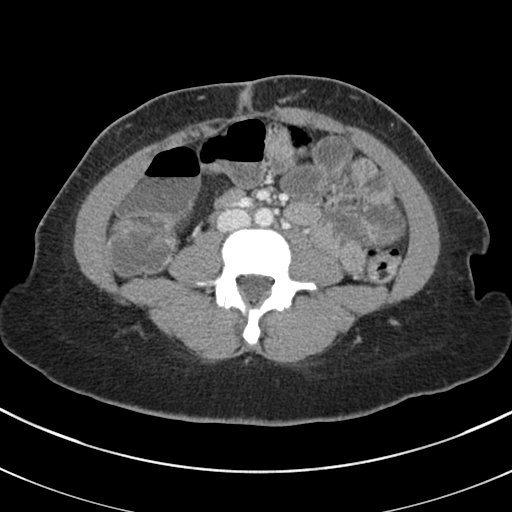
[im 56/84  soft-tissue]
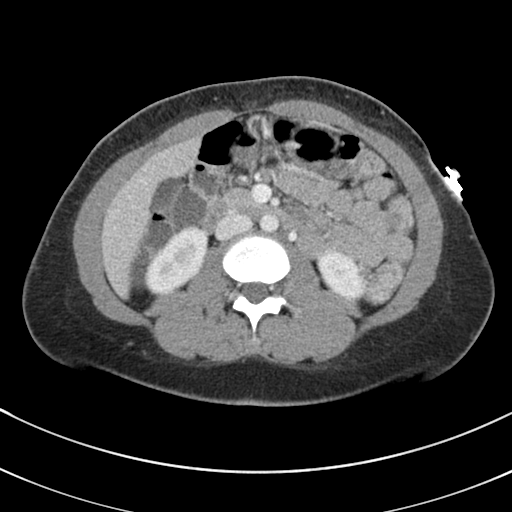
[im 56/84  bone]
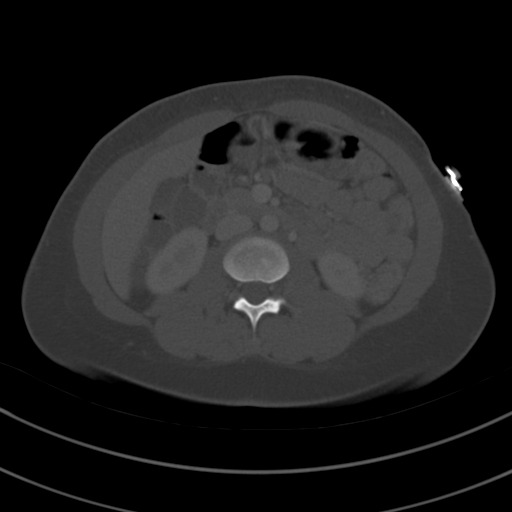
[im 60/84  soft-tissue]
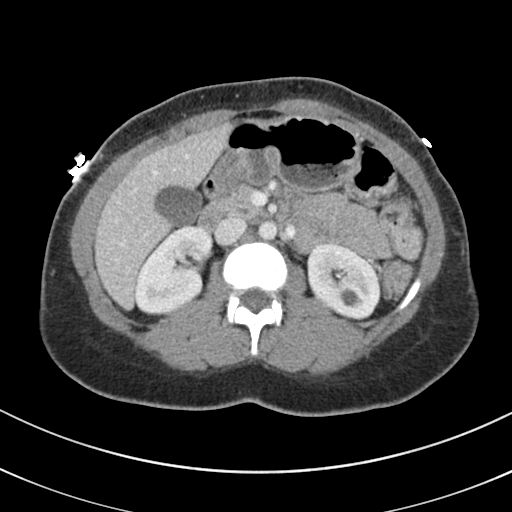
[im 68/84  soft-tissue]
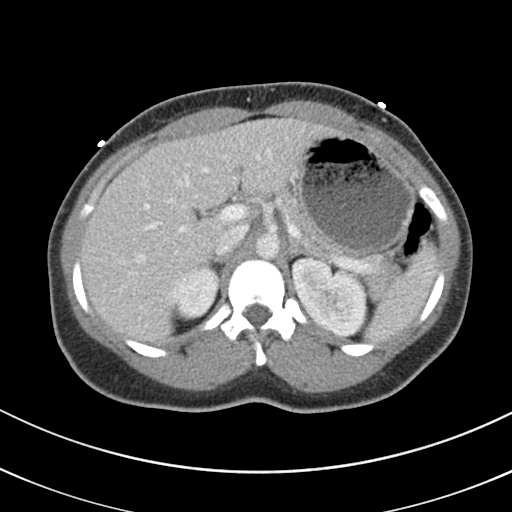
[im 72/84  soft-tissue]
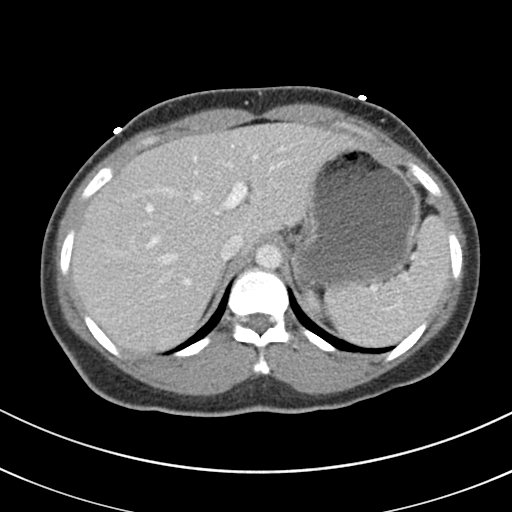
[im 80/84  soft-tissue]
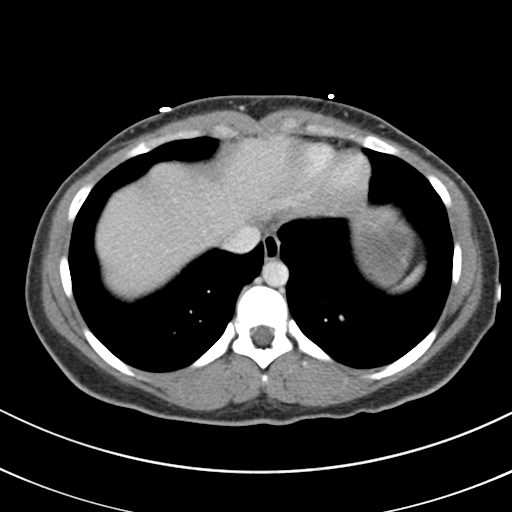

[Series 5: coronal st · coronal · 0.63mm/px · 3 of 116 slices shown]
[im 39/116  soft-tissue]
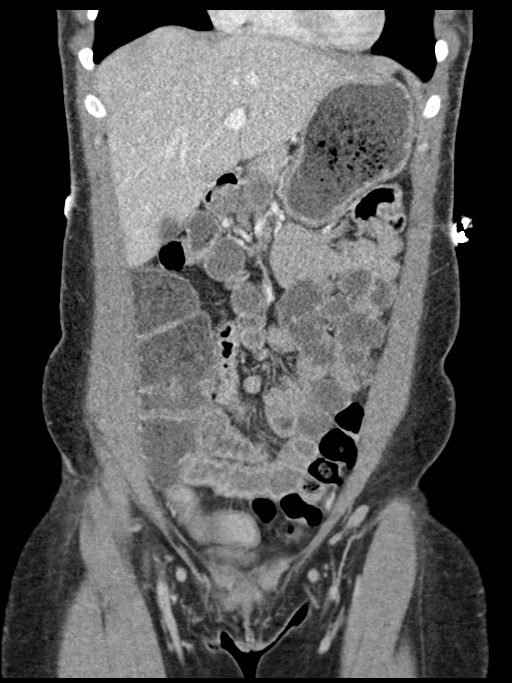
[im 52/116  soft-tissue]
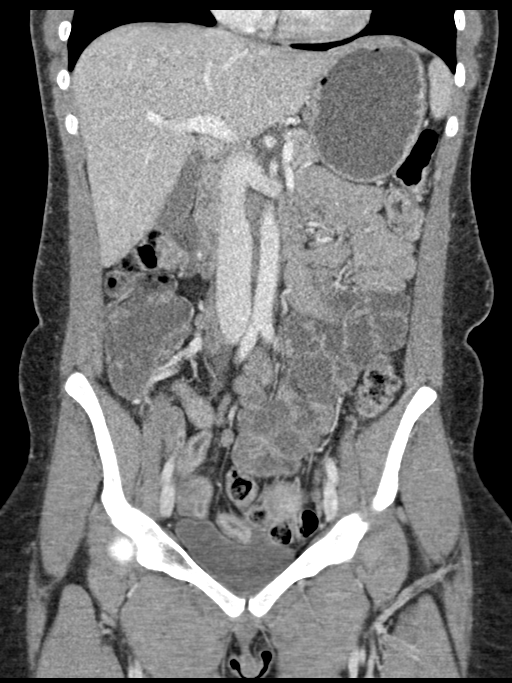
[im 64/116  soft-tissue]
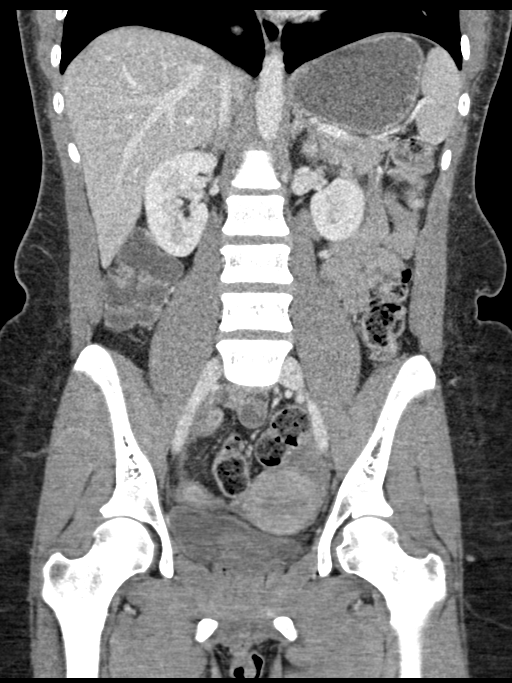

[16 of 46 positions shown; findings below may reference images not displayed]

FINDINGS: Lower chest: Lung bases demonstrate no acute consolidation or
effusion. The heart size is within normal limits.

Hepatobiliary: No focal liver abnormality is seen. No gallstones,
gallbladder wall thickening, or biliary dilatation.

Pancreas: Unremarkable. No pancreatic ductal dilatation or
surrounding inflammatory changes.

Spleen: Normal in size without focal abnormality.

Adrenals/Urinary Tract: Adrenal glands are unremarkable. Kidneys
show no hydronephrosis. Cyst within the mid to lower left kidney.
Increased density within the renal collecting systems, likely early
excretion of contrast though there may be a stone in the upper pole
of the right kidney.

Stomach/Bowel: The stomach is nonenlarged. No dilated small bowel.
Mild wall thickening and mucosal enhancement involving distal small
bowel loops/terminal ileum within the pelvis. Negative appendix.
Fluid in the colon without colon wall thickening.

Vascular/Lymphatic: No significant vascular findings are present. No
enlarged abdominal or pelvic lymph nodes.

Reproductive: Uterus and bilateral adnexa are unremarkable.

Other: Negative for free air or free fluid. Metal ornamentation at
the mons pubis.

Musculoskeletal: Grade 1 anterolisthesis L5 on S1 with chronic pars
defect at L5
IMPRESSION: 1. Mildly thickened terminal ileum with mucosal enhancement
consistent with ileitis, likely related to the history of
inflammatory bowel disease. No evidence for obstruction or
perforation.
2. Grade 1 anterolisthesis L5 on S1 with chronic pars defect at L5.
3. Possible right kidney stone

## 2020-12-21 ENCOUNTER — Telehealth: Payer: Self-pay | Admitting: Family Medicine

## 2020-12-21 NOTE — Telephone Encounter (Signed)
Called pt and pt informed me that she had this same thing in 2020 and it had to be drained.  Pt stated that she works from home and has to wear a pad because of the drainage.  Pt also reports that she has an appt on Monday.  I encouraged pt to place warm compresses on the area, continue to wear the pad as its a good thing that it is draining, and also utilize ibuprofen or tylenol for pain.  Pt verbalized understanding with no further questions.   Sharon Clayton  12/21/20

## 2020-12-21 NOTE — Telephone Encounter (Signed)
Patient calling regarding a  bartholin's abscess, state she does not feel well, I scheduledher for our firt open slot on this Monday, she want a nurse to call her she has more questions

## 2020-12-25 ENCOUNTER — Encounter: Payer: Self-pay | Admitting: Obstetrics and Gynecology

## 2020-12-25 ENCOUNTER — Ambulatory Visit (INDEPENDENT_AMBULATORY_CARE_PROVIDER_SITE_OTHER): Payer: Medicaid Other | Admitting: Obstetrics and Gynecology

## 2020-12-25 VITALS — BP 114/79 | HR 85 | Ht 62.0 in | Wt 141.0 lb

## 2020-12-25 DIAGNOSIS — N750 Cyst of Bartholin's gland: Secondary | ICD-10-CM | POA: Diagnosis not present

## 2020-12-25 DIAGNOSIS — N898 Other specified noninflammatory disorders of vagina: Secondary | ICD-10-CM | POA: Diagnosis not present

## 2020-12-25 MED ORDER — SULFAMETHOXAZOLE-TRIMETHOPRIM 800-160 MG PO TABS: 1.0000 | ORAL_TABLET | Freq: Two times a day (BID) | ORAL | 0 refills | Status: AC

## 2020-12-25 MED ORDER — FLUCONAZOLE 150 MG PO TABS: 150.0000 mg | ORAL_TABLET | Freq: Once | ORAL | 0 refills | Status: DC

## 2020-12-25 NOTE — Progress Notes (Signed)
  CC: Bartholin's cyst Subjective:    Patient ID: Sharon Clayton, female    DOB: 23-Oct-1990, 30 y.o.   MRN: [id number]  HPI 30 yo G4P2 seen at [facility] with 1 week history of recurrent Bartholin's gland cyst.  Pt notes it has been draining on its own and seems "to be coming from the vagina."  Pain is decreased since it has been draining.  Pt had a previous cyst in the same area in 2020 which was treated with I and D along with a ward catheter placement..   Review of Systems     Objective:   Physical Exam Vitals:   12/25/20 0828  BP: 114/79  Pulse: 85   SVE: left bartholin's cyst.  Cyst had small opening and was draining spontaneously.  Fluid was a yellow green color.  Once the cyst was decompressed, pt stated her discomfort was decreased.  There was no induration or erythema.  Wound culture was taken.      Assessment & Plan:   1. Bartholin cyst Extension of the opening with placement of ward catheter was offered, but pt declined.  Pt made aware that if discharge continued for another week, she should reschedule and come back for full drainage and catheter placement.  Pt also told she has a higher risk of recurrence without catheter placement.    - sulfamethoxazole-trimethoprim (BACTRIM DS) 800-160 MG tablet; Take 1 tablet by mouth 2 (two) times daily for 5 days.  Dispense: 10 tablet; Refill: 0  2. Vaginal discharge Per pt request due to antibiotic use - fluconazole (DIFLUCAN) 150 MG tablet; Take 1 tablet (150 mg total) by mouth once for 1 dose. Can take additional dose three days later if symptoms persist  Dispense: 1 tablet; Refill: 0    Griffin Basil, MD Faculty Attending, [facility]

## 2020-12-27 LAB — WOUND CULTURE

## 2021-01-24 ENCOUNTER — Other Ambulatory Visit: Payer: Self-pay | Admitting: Obstetrics and Gynecology

## 2021-01-24 DIAGNOSIS — N898 Other specified noninflammatory disorders of vagina: Secondary | ICD-10-CM

## 2021-03-04 ENCOUNTER — Inpatient Hospital Stay (HOSPITAL_COMMUNITY)
Admission: AD | Admit: 2021-03-04 | Discharge: 2021-03-04 | Disposition: A | Discharge status: Home / Self Care | Payer: Medicaid Other | Attending: Obstetrics and Gynecology | Admitting: Obstetrics and Gynecology

## 2021-03-04 ENCOUNTER — Encounter (HOSPITAL_COMMUNITY): Payer: Self-pay | Admitting: Obstetrics and Gynecology

## 2021-03-04 ENCOUNTER — Inpatient Hospital Stay (HOSPITAL_COMMUNITY): Payer: Medicaid Other

## 2021-03-04 ENCOUNTER — Encounter: Payer: Self-pay | Admitting: Certified Nurse Midwife

## 2021-03-04 ENCOUNTER — Other Ambulatory Visit: Payer: Self-pay

## 2021-03-04 DIAGNOSIS — O26899 Other specified pregnancy related conditions, unspecified trimester: Secondary | ICD-10-CM

## 2021-03-04 DIAGNOSIS — K5090 Crohn's disease, unspecified, without complications: Secondary | ICD-10-CM | POA: Insufficient documentation

## 2021-03-04 DIAGNOSIS — O99331 Smoking (tobacco) complicating pregnancy, first trimester: Secondary | ICD-10-CM | POA: Insufficient documentation

## 2021-03-04 DIAGNOSIS — O34211 Maternal care for low transverse scar from previous cesarean delivery: Secondary | ICD-10-CM | POA: Diagnosis not present

## 2021-03-04 DIAGNOSIS — R109 Unspecified abdominal pain: Secondary | ICD-10-CM | POA: Insufficient documentation

## 2021-03-04 DIAGNOSIS — O26891 Other specified pregnancy related conditions, first trimester: Secondary | ICD-10-CM | POA: Insufficient documentation

## 2021-03-04 DIAGNOSIS — Z3490 Encounter for supervision of normal pregnancy, unspecified, unspecified trimester: Secondary | ICD-10-CM

## 2021-03-04 DIAGNOSIS — O23599 Infection of other part of genital tract in pregnancy, unspecified trimester: Secondary | ICD-10-CM | POA: Insufficient documentation

## 2021-03-04 DIAGNOSIS — Z3A01 Less than 8 weeks gestation of pregnancy: Secondary | ICD-10-CM | POA: Insufficient documentation

## 2021-03-04 DIAGNOSIS — O98313 Other infections with a predominantly sexual mode of transmission complicating pregnancy, third trimester: Secondary | ICD-10-CM | POA: Diagnosis not present

## 2021-03-04 DIAGNOSIS — F17290 Nicotine dependence, other tobacco product, uncomplicated: Secondary | ICD-10-CM | POA: Diagnosis not present

## 2021-03-04 DIAGNOSIS — Z881 Allergy status to other antibiotic agents status: Secondary | ICD-10-CM | POA: Insufficient documentation

## 2021-03-04 DIAGNOSIS — O98311 Other infections with a predominantly sexual mode of transmission complicating pregnancy, first trimester: Secondary | ICD-10-CM | POA: Insufficient documentation

## 2021-03-04 DIAGNOSIS — O99611 Diseases of the digestive system complicating pregnancy, first trimester: Secondary | ICD-10-CM | POA: Diagnosis not present

## 2021-03-04 DIAGNOSIS — A5901 Trichomonal vulvovaginitis: Secondary | ICD-10-CM

## 2021-03-04 LAB — CBC
HCT: 31.5 % — ABNORMAL LOW (ref 36.0–46.0)
Hemoglobin: 10.5 g/dL — ABNORMAL LOW (ref 12.0–15.0)
MCH: 27.2 pg (ref 26.0–34.0)
MCHC: 33.3 g/dL (ref 30.0–36.0)
MCV: 81.6 fL (ref 80.0–100.0)
Platelets: 332 10*3/uL (ref 150–400)
RBC: 3.86 MIL/uL — ABNORMAL LOW (ref 3.87–5.11)
RDW: 14.7 % (ref 11.5–15.5)
WBC: 7.3 10*3/uL (ref 4.0–10.5)
nRBC: 0 % (ref 0.0–0.2)

## 2021-03-04 LAB — HCG, QUANTITATIVE, PREGNANCY: hCG, Beta Chain, Quant, S: 25254 m[IU]/mL — ABNORMAL HIGH (ref <5)

## 2021-03-04 LAB — URINALYSIS, ROUTINE W REFLEX MICROSCOPIC
Bilirubin Urine: NEGATIVE
Glucose, UA: NEGATIVE mg/dL
Hgb urine dipstick: NEGATIVE
Ketones, ur: 5 mg/dL — AB
Leukocytes,Ua: NEGATIVE
Nitrite: NEGATIVE
Protein, ur: NEGATIVE mg/dL
Specific Gravity, Urine: 1.013 (ref 1.005–1.030)
pH: 6 (ref 5.0–8.0)

## 2021-03-04 LAB — WET PREP, GENITAL
Clue Cells Wet Prep HPF POC: NONE SEEN
Sperm: NONE SEEN
Yeast Wet Prep HPF POC: NONE SEEN

## 2021-03-04 LAB — POCT PREGNANCY, URINE: Preg Test, Ur: POSITIVE — AB

## 2021-03-04 MED ORDER — DIPHENHYDRAMINE HCL 25 MG PO CAPS
25.0000 mg | ORAL_CAPSULE | Freq: Once | ORAL | Status: AC
  Administered 2021-03-04: 25 mg via ORAL
  Filled 2021-03-04: qty 1

## 2021-03-04 MED ORDER — METRONIDAZOLE 500 MG PO TABS
2000.0000 mg | ORAL_TABLET | Freq: Once | ORAL | Status: AC
  Administered 2021-03-04: 2000 mg via ORAL
  Filled 2021-03-04: qty 4

## 2021-03-04 NOTE — MAU Provider Note (Signed)
History     CSN: [id number]  Arrival date and time: 03/04/21 1252   Event Date/Time   First Provider Initiated Contact with Patient 03/04/21 1346      Chief Complaint  Patient presents with   Abdominal Pain   Possible Pregnancy   30 y.o. [id number] @[redacted]w[redacted]d  by sure LMP presenting with left ovary pain. Reports onset about 2 weeks ago. Pain is intermittent and throbbing. Rates pain 5/10. Has tried Tylenol but it only helps temporarily. Denies urinary sx. Denies vaginal itching or maldoor, but had some brown discharge a few days ago.   OB History     Gravida  5   Para  2   Term  2   Preterm  0   AB  2   Living  2      SAB  1   IAB  1   Ectopic  0   Multiple  0   Live Births  2           Past Medical History:  Diagnosis Date   Bacterial infection 09/10/11   Crohn's disease ([place])    H/O varicella 09/10/11   Hypotension    IBS (irritable bowel syndrome)    Low iron 09/10/11   Trichomonas 09/10/10   Yeast infection 09/10/11    Past Surgical History:  Procedure Laterality Date   CESAREAN SECTION  09/10/11   NRFHT - primary low transverse cesarean section    CESAREAN SECTION N/A 09/04/2013   Procedure: CESAREAN SECTION- repeat;  Surgeon: Osborne Oman, MD;  Location: [facility];  Service: Obstetrics;  Laterality: N/A;    Family History  Problem Relation Age of Onset   Hypertension Mother    Hypertension Father    Colon cancer Neg Hx     Social History   Tobacco Use   Smoking status: Every Day    Types: E-cigarettes   Smokeless tobacco: Never   Tobacco comments:    e-cig  Vaping Use   Vaping Use: Never used  Substance Use Topics   Alcohol use: Not Currently    Comment: occ   Drug use: Not Currently    Types: Marijuana    Allergies:  Allergies  Allergen Reactions   Lactose Intolerance (Gi) Diarrhea and Nausea And Vomiting   Flagyl [Metronidazole] Itching and Rash    Pill form    Medications Prior to Admission  Medication Sig Dispense Refill Last  Dose   Bismuth Subsalicylate (PEPTO-BISMOL MAX STRENGTH) 525 MG/15ML SUSP Take 30 mLs by mouth every 30 (thirty) minutes as needed (upset stomach, nausea). (Patient not taking: Reported on 12/25/2020)      cetirizine (ZYRTEC) 10 MG tablet Take 10 mg by mouth daily as needed for allergies.      dicyclomine (BENTYL) 10 MG/5ML syrup Take 5 mLs by mouth in the morning and at bedtime. (Patient not taking: Reported on 12/25/2020)      fluconazole (DIFLUCAN) 150 MG tablet TAKE 1 TABLET ONCE FOR 1 DOSE. CAN TAKE ADDITIONAL DOSE THREE DAYS LATER IF SYMPTOMS PERSIST 1 tablet 0    fluticasone (FLONASE) 50 MCG/ACT nasal spray Place 2 sprays into both nostrils daily.      ibuprofen (ADVIL,MOTRIN) 200 MG tablet Take 200 mg by mouth every 6 (six) hours as needed for moderate pain.      Multiple Vitamin (MULTIVITAMIN) tablet Take 1 tablet by mouth daily.      Simethicone 250 MG CAPS Take 250 mg by mouth daily as needed (gas). (Patient not  taking: Reported on 12/25/2020)       Review of Systems  Constitutional:  Negative for fever.  Gastrointestinal:  Positive for abdominal pain.  Genitourinary:  Positive for vaginal discharge. Negative for dysuria, frequency and hematuria.  Physical Exam   Blood pressure 113/63, pulse 90, temperature 98.4 F (36.9 C), temperature source Oral, resp. rate 16, height 5' 2"  (1.575 m), weight 66.6 kg, last menstrual period 01/21/2021, SpO2 100 %.  Physical Exam Vitals and nursing note reviewed.  Constitutional:      General: She is not in acute distress.    Appearance: Normal appearance.  HENT:     Head: Normocephalic.  Cardiovascular:     Rate and Rhythm: Normal rate.  Pulmonary:     Effort: Pulmonary effort is normal. No respiratory distress.  Abdominal:     General: There is no distension.     Palpations: Abdomen is soft. There is no mass.     Tenderness: There is no abdominal tenderness. There is no guarding or rebound.     Hernia: No hernia is present.   Musculoskeletal:        General: Normal range of motion.     Cervical back: Normal range of motion.  Skin:    General: Skin is warm and dry.  Neurological:     General: No focal deficit present.     Mental Status: She is alert and oriented to person, place, and time.  Psychiatric:        Mood and Affect: Mood normal.        Behavior: Behavior normal.   Results for orders placed or performed during the hospital encounter of 03/04/21 (from the past 24 hour(s))  Pregnancy, urine POC     Status: Abnormal   Collection Time: 03/04/21  1:21 PM  Result Value Ref Range   Preg Test, Ur POSITIVE (A) NEGATIVE  Urinalysis, Routine w reflex microscopic Urine, Clean Catch     Status: Abnormal   Collection Time: 03/04/21  1:24 PM  Result Value Ref Range   Color, Urine YELLOW YELLOW   APPearance HAZY (A) CLEAR   Specific Gravity, Urine 1.013 1.005 - 1.030   pH 6.0 5.0 - 8.0   Glucose, UA NEGATIVE NEGATIVE mg/dL   Hgb urine dipstick NEGATIVE NEGATIVE   Bilirubin Urine NEGATIVE NEGATIVE   Ketones, ur 5 (A) NEGATIVE mg/dL   Protein, ur NEGATIVE NEGATIVE mg/dL   Nitrite NEGATIVE NEGATIVE   Leukocytes,Ua NEGATIVE NEGATIVE  Wet prep, genital     Status: Abnormal   Collection Time: 03/04/21  1:55 PM   Specimen: Vaginal  Result Value Ref Range   Yeast Wet Prep HPF POC NONE SEEN NONE SEEN   Trich, Wet Prep PRESENT (A) NONE SEEN   Clue Cells Wet Prep HPF POC NONE SEEN NONE SEEN   WBC, Wet Prep HPF POC MODERATE (A) NONE SEEN   Sperm NONE SEEN   CBC     Status: Abnormal   Collection Time: 03/04/21  2:18 PM  Result Value Ref Range   WBC 7.3 4.0 - 10.5 K/uL   RBC 3.86 (L) 3.87 - 5.11 MIL/uL   Hemoglobin 10.5 (L) 12.0 - 15.0 g/dL   HCT 31.5 (L) 36.0 - 46.0 %   MCV 81.6 80.0 - 100.0 fL   MCH 27.2 26.0 - 34.0 pg   MCHC 33.3 30.0 - 36.0 g/dL   RDW 14.7 11.5 - 15.5 %   Platelets 332 150 - 400 K/uL   nRBC 0.0 0.0 -  0.2 %  hCG, quantitative, pregnancy     Status: Abnormal   Collection Time:  03/04/21  2:18 PM  Result Value Ref Range   hCG, Beta Chain, Quant, S 25,254 (H) <5 mIU/mL   US OB LESS THAN 14 WEEKS WITH OB TRANSVAGINAL  Result Date: 03/04/2021 CLINICAL DATA:  Abdominal pain EXAM: OBSTETRIC <14 WK [place] AND TRANSVAGINAL OB US TECHNIQUE: Both transabdominal and transvaginal ultrasound examinations were performed for complete evaluation of the gestation as well as the maternal uterus, adnexal regions, and pelvic cul-de-sac. Transvaginal technique was performed to assess early pregnancy. COMPARISON:  None. FINDINGS: Intrauterine gestational sac: Single Yolk sac:  Visualized. Embryo:  Not Visualized. Cardiac Activity: Not Visualized. MSD: 14 mm   6 w   1 d LMP: 01/21/2021. Gestational age by LMP is 6 weeks 0 days. EDC by LMP is 10/28/2021. Subchorionic hemorrhage: Small amount Right ovary: It contains several small follicles. It measures 3.6 x 1.8 x 2.1 cm. Left ovary: It contains a likely corpus luteal cyst. It measures 3.9 x 2.0 x 2.0 cm. Other :None Free fluid:  Small volume IMPRESSION: 1. There is a single intrauterine gestational sac noted. No embryo is identified at this point in time. Estimated gestational age as determined by LMP is 6 weeks 0 days. Electronically Signed   By: Valentino Saxon M.D.   On: 03/04/2021 15:08    MAU Course  Procedures Flagyl Benadryl  MDM Labs and [place] ordered and reviewed. Early IUP seen on [place], no FP yet. +trich, discussed with pt while husband was out of room. Allergy to Flagyl, pt reports small localized rash on her back the last time she took it, no anaphylaxis sx. Discussed with pharmacist Kenney Houseman who recommends taking Benadryl with Flagyl. Pt willing to try med. She knows partner will need treatment as well and she will inform him when she's ready. Stable for discharge home.   Assessment and Plan   1. Early stage of pregnancy   2. Abdominal pain affecting pregnancy   3. Trichomonal vaginitis    Discharge home Follow up at [facility] to start  care SAB precautions  Allergies as of 03/04/2021       Reactions   Lactose Intolerance (gi) Diarrhea, Nausea And Vomiting   Flagyl [metronidazole] Itching, Rash   Pill form        Medication List     STOP taking these medications    dicyclomine 10 MG/5ML solution Commonly known as: BENTYL   fluconazole 150 MG tablet Commonly known as: DIFLUCAN   ibuprofen 200 MG tablet Commonly known as: ADVIL   Pepto-Bismol Max Strength 525 MG/15ML Susp Generic drug: Bismuth Subsalicylate   Simethicone 250 MG Caps       TAKE these medications    cetirizine 10 MG tablet Commonly known as: ZYRTEC Take 10 mg by mouth daily as needed for allergies.   fluticasone 50 MCG/ACT nasal spray Commonly known as: FLONASE Place 2 sprays into both nostrils daily.   multivitamin tablet Take 1 tablet by mouth daily.         Julianne Handler, CNM 03/04/2021, 3:45 PM

## 2021-03-04 NOTE — MAU Note (Signed)
Started 2 wks ago, dull pain in left ovary, sometimes in left lower back, last night is was shooting down her left leg.  Brownish spotting about 5 days, none since. 8+HPT, first was 9/12. Has not been confirmed anywhere yet.

## 2021-03-05 LAB — GC/CHLAMYDIA PROBE AMP (~~LOC~~) NOT AT ARMC
Chlamydia: NEGATIVE
Comment: NEGATIVE
Comment: NORMAL
Neisseria Gonorrhea: NEGATIVE

## 2021-04-18 ENCOUNTER — Encounter: Payer: Medicaid Other | Admitting: Family Medicine

## 2021-05-03 ENCOUNTER — Encounter (HOSPITAL_COMMUNITY): Payer: Self-pay

## 2021-05-03 ENCOUNTER — Emergency Department (HOSPITAL_COMMUNITY)
Admission: EM | Admit: 2021-05-03 | Discharge: 2021-05-03 | Disposition: A | Discharge status: Home / Self Care | Payer: Medicaid Other | Attending: Emergency Medicine | Admitting: Emergency Medicine

## 2021-05-03 ENCOUNTER — Emergency Department (HOSPITAL_COMMUNITY): Payer: Medicaid Other

## 2021-05-03 ENCOUNTER — Other Ambulatory Visit: Payer: Self-pay

## 2021-05-03 DIAGNOSIS — D72829 Elevated white blood cell count, unspecified: Secondary | ICD-10-CM | POA: Diagnosis not present

## 2021-05-03 DIAGNOSIS — Z20822 Contact with and (suspected) exposure to covid-19: Secondary | ICD-10-CM | POA: Diagnosis not present

## 2021-05-03 DIAGNOSIS — F17210 Nicotine dependence, cigarettes, uncomplicated: Secondary | ICD-10-CM | POA: Diagnosis not present

## 2021-05-03 DIAGNOSIS — R55 Syncope and collapse: Secondary | ICD-10-CM | POA: Insufficient documentation

## 2021-05-03 DIAGNOSIS — E876 Hypokalemia: Secondary | ICD-10-CM | POA: Diagnosis not present

## 2021-05-03 DIAGNOSIS — N3090 Cystitis, unspecified without hematuria: Secondary | ICD-10-CM | POA: Insufficient documentation

## 2021-05-03 DIAGNOSIS — N751 Abscess of Bartholin's gland: Secondary | ICD-10-CM | POA: Diagnosis not present

## 2021-05-03 LAB — COMPREHENSIVE METABOLIC PANEL
ALT: 11 U/L (ref 0–44)
AST: 15 U/L (ref 15–41)
Albumin: 3.2 g/dL — ABNORMAL LOW (ref 3.5–5.0)
Alkaline Phosphatase: 71 U/L (ref 38–126)
Anion gap: 9 (ref 5–15)
BUN: 7 mg/dL (ref 6–20)
CO2: 21 mmol/L — ABNORMAL LOW (ref 22–32)
Calcium: 8.6 mg/dL — ABNORMAL LOW (ref 8.9–10.3)
Chloride: 106 mmol/L (ref 98–111)
Creatinine, Ser: 0.72 mg/dL (ref 0.44–1.00)
GFR, Estimated: >60 mL/min (ref >60)
Glucose, Bld: 96 mg/dL (ref 70–99)
Potassium: 3.1 mmol/L — ABNORMAL LOW (ref 3.5–5.1)
Sodium: 136 mmol/L (ref 135–145)
Total Bilirubin: 1.2 mg/dL (ref 0.3–1.2)
Total Protein: 7.4 g/dL (ref 6.5–8.1)

## 2021-05-03 LAB — URINALYSIS, ROUTINE W REFLEX MICROSCOPIC
Bilirubin Urine: NEGATIVE
Glucose, UA: NEGATIVE mg/dL
Ketones, ur: 20 mg/dL — AB
Nitrite: NEGATIVE
Protein, ur: NEGATIVE mg/dL
Specific Gravity, Urine: 1.003 — ABNORMAL LOW (ref 1.005–1.030)
WBC, UA: >50 WBC/hpf — ABNORMAL HIGH (ref 0–5)
pH: 7 (ref 5.0–8.0)

## 2021-05-03 LAB — CBC WITH DIFFERENTIAL/PLATELET
Abs Immature Granulocytes: 0.05 10*3/uL (ref 0.00–0.07)
Basophils Absolute: 0 10*3/uL (ref 0.0–0.1)
Basophils Relative: 0 %
Eosinophils Absolute: 0 10*3/uL (ref 0.0–0.5)
Eosinophils Relative: 0 %
HCT: 34.1 % — ABNORMAL LOW (ref 36.0–46.0)
Hemoglobin: 11.1 g/dL — ABNORMAL LOW (ref 12.0–15.0)
Immature Granulocytes: 0 %
Lymphocytes Relative: 10 %
Lymphs Abs: 1.5 10*3/uL (ref 0.7–4.0)
MCH: 27.5 pg (ref 26.0–34.0)
MCHC: 32.6 g/dL (ref 30.0–36.0)
MCV: 84.4 fL (ref 80.0–100.0)
Monocytes Absolute: 1 10*3/uL (ref 0.1–1.0)
Monocytes Relative: 7 %
Neutro Abs: 12.8 10*3/uL — ABNORMAL HIGH (ref 1.7–7.7)
Neutrophils Relative %: 83 %
Platelets: 320 10*3/uL (ref 150–400)
RBC: 4.04 MIL/uL (ref 3.87–5.11)
RDW: 14.7 % (ref 11.5–15.5)
WBC: 15.3 10*3/uL — ABNORMAL HIGH (ref 4.0–10.5)
nRBC: 0 % (ref 0.0–0.2)

## 2021-05-03 LAB — TROPONIN I (HIGH SENSITIVITY): Troponin I (High Sensitivity): 3 ng/L (ref <18)

## 2021-05-03 LAB — PREGNANCY, URINE: Preg Test, Ur: NEGATIVE

## 2021-05-03 LAB — RESP PANEL BY RT-PCR (FLU A&B, COVID) ARPGX2
Influenza A by PCR: NEGATIVE
Influenza B by PCR: NEGATIVE
SARS Coronavirus 2 by RT PCR: NEGATIVE

## 2021-05-03 MED ORDER — CEPHALEXIN 500 MG PO CAPS: 500.0000 mg | ORAL_CAPSULE | Freq: Two times a day (BID) | ORAL | 0 refills | Status: AC

## 2021-05-03 MED ORDER — POTASSIUM CHLORIDE 10 MEQ/100ML IV SOLN
10.0000 meq | INTRAVENOUS | Status: AC
  Administered 2021-05-03 (×2): 10 meq via INTRAVENOUS
  Filled 2021-05-03 (×2): qty 100

## 2021-05-03 MED ORDER — POTASSIUM CHLORIDE ER 10 MEQ PO TBCR: 10.0000 meq | EXTENDED_RELEASE_TABLET | Freq: Every day | ORAL | 0 refills | Status: DC

## 2021-05-03 MED ORDER — IOHEXOL 350 MG/ML SOLN
75.0000 mL | Freq: Once | INTRAVENOUS | Status: AC | PRN
  Administered 2021-05-03: 75 mL via INTRAVENOUS

## 2021-05-03 MED ORDER — LIDOCAINE-EPINEPHRINE-TETRACAINE (LET) TOPICAL GEL
3.0000 mL | Freq: Once | TOPICAL | Status: AC
  Administered 2021-05-03: 3 mL via TOPICAL
  Filled 2021-05-03: qty 3

## 2021-05-03 MED ORDER — KETOROLAC TROMETHAMINE 30 MG/ML IJ SOLN
30.0000 mg | Freq: Once | INTRAMUSCULAR | Status: AC
  Administered 2021-05-03: 30 mg via INTRAVENOUS
  Filled 2021-05-03: qty 1

## 2021-05-03 MED ORDER — SODIUM CHLORIDE 0.9 % IV SOLN
2.0000 g | Freq: Once | INTRAVENOUS | Status: AC
  Administered 2021-05-03: 2 g via INTRAVENOUS
  Filled 2021-05-03: qty 20

## 2021-05-03 MED ORDER — HYDROMORPHONE HCL 1 MG/ML IJ SOLN
0.5000 mg | Freq: Once | INTRAMUSCULAR | Status: AC
  Administered 2021-05-03: 0.5 mg via INTRAVENOUS
  Filled 2021-05-03: qty 1

## 2021-05-03 MED ORDER — OXYCODONE-ACETAMINOPHEN 5-325 MG PO TABS: 1.0000 | ORAL_TABLET | Freq: Three times a day (TID) | ORAL | 0 refills | Status: DC | PRN

## 2021-05-03 MED ORDER — SODIUM CHLORIDE (PF) 0.9 % IJ SOLN
INTRAMUSCULAR | Status: AC
  Filled 2021-05-03: qty 50

## 2021-05-03 MED ORDER — SODIUM CHLORIDE 0.9 % IV BOLUS
1000.0000 mL | Freq: Once | INTRAVENOUS | Status: AC
  Administered 2021-05-03: 1000 mL via INTRAVENOUS

## 2021-05-03 NOTE — ED Provider Notes (Signed)
[facility] DEPT Provider Note   CSN: [id number] Arrival date & time: 05/03/21  1013     History CC: Near syncope   Sharon Clayton is a 30 y.o. female with a history of Crohn's disease, recurring Bartholin abscess or cyst, presenting to emergency department and near syncope and chills.  The patient reports that yesterday evening she was having chills and felt weak and nauseated, sometimes with a headache.  This morning she was on the toilet became very lightheaded, felt like she might pass out, lowered her self to the ground.  There is no loss of consciousness.  She feels very fatigued.  She denies any chest pain or pressure.  She denies bloody diarrhea, report she has chronic diarrhea from her Crohn's disease.  She reports that she is having "some swelling near the vagina" and states she saw her PCP for this issue, and was told that she should see OB/GYN, she has an appointment with them in 2 weeks.  She states she is not currently on antibiotics.  The swelling of her Bartholin cyst has been coming and going for several days, and she treats it with heating packs and topical lidocaine.  It may have spontaneously drained a bit yesterday.  She does report that she had a spontaneous abortion approximately 3 weeks ago, at 10-[redacted] weeks gestation.  She said she continued having some bleeding until about a week ago and it stopped.  She denies vaginal discharge  Patient is reported she had some urinary pressure a week ago and took cranberry juice for her.  She wondered whether she may have a UTI.  HPI     Past Medical History:  Diagnosis Date   Bacterial infection 09/10/11   Crohn's disease ([place])    H/O varicella 09/10/11   Hypotension    IBS (irritable bowel syndrome)    Low iron 09/10/11   Trichomonas 09/10/10   Yeast infection 09/10/11    Patient Active Problem List   Diagnosis Date Noted   Trichomonal vaginitis during pregnancy 03/04/2021   Vaginal discharge  12/25/2020   Bartholin cyst 07/16/2018   Crohn's disease with complication ([place]) 32/20/2542   S/P cesarean section 09/04/2013   Smoker 06/08/2013   IBS (irritable bowel syndrome) 06/08/2013   Uses marijuana 04/13/2013    Past Surgical History:  Procedure Laterality Date   CESAREAN SECTION  09/10/11   NRFHT - primary low transverse cesarean section    CESAREAN SECTION N/A 09/04/2013   Procedure: CESAREAN SECTION- repeat;  Surgeon: Osborne Oman, MD;  Location: [facility];  Service: Obstetrics;  Laterality: N/A;     OB History     Gravida  5   Para  2   Term  2   Preterm  0   AB  2   Living  2      SAB  1   IAB  1   Ectopic  0   Multiple  0   Live Births  2           Family History  Problem Relation Age of Onset   Hypertension Mother    Hypertension Father    Colon cancer Neg Hx     Social History   Tobacco Use   Smoking status: Every Day    Types: E-cigarettes   Smokeless tobacco: Never   Tobacco comments:    e-cig  Vaping Use   Vaping Use: Never used  Substance Use Topics   Alcohol use: Not  Currently    Comment: occ   Drug use: Not Currently    Types: Marijuana    Home Medications Prior to Admission medications   Medication Sig Start Date End Date Taking? Authorizing Provider  cephALEXin (KEFLEX) 500 MG capsule Take 1 capsule (500 mg total) by mouth 2 (two) times daily for 7 days. 05/04/21 05/11/21 Yes Urijah Arko, Carola Rhine, MD  cephALEXin (KEFLEX) 500 MG capsule Take 1 capsule (500 mg total) by mouth 2 (two) times daily for 7 days. 05/04/21 05/11/21 Yes Kailee Essman, Carola Rhine, MD  fluticasone Asencion Islam) 50 MCG/ACT nasal spray Place 2 sprays into both nostrils daily as needed for allergies.   Yes [provider]  ibuprofen (ADVIL) 200 MG tablet Take 800 mg by mouth every 6 (six) hours as needed for fever, headache or mild pain.   Yes [provider]  oxyCODONE-acetaminophen (PERCOCET/ROXICET) 5-325 MG tablet Take 1 tablet by mouth every  8 (eight) hours as needed for up to 12 doses for severe pain. 05/03/21  Yes Jasmene Goswami, Carola Rhine, MD  potassium chloride (KLOR-CON) 10 MEQ tablet Take 1 tablet (10 mEq total) by mouth daily for 30 doses. 05/03/21 06/02/21 Yes Garrett Mitchum, Carola Rhine, MD  Prenatal Vit-Fe Fumarate-FA (PRENATAL PO) Take 1 tablet by mouth daily.   Yes [provider]    Allergies    Lactose intolerance (gi) and Flagyl [metronidazole]  Review of Systems   Review of Systems  Constitutional:  Positive for chills, fatigue and fever.  HENT:  Negative for ear pain and sore throat.   Eyes:  Negative for pain and visual disturbance.  Respiratory:  Negative for cough and shortness of breath.   Cardiovascular:  Negative for chest pain and palpitations.  Gastrointestinal:  Negative for abdominal pain and vomiting.  Genitourinary:  Positive for dysuria and vaginal pain. Negative for flank pain, vaginal bleeding and vaginal discharge.  Musculoskeletal:  Negative for arthralgias and back pain.  Skin:  Negative for color change and rash.  Neurological:  Negative for seizures and syncope.  All other systems reviewed and are negative.  Physical Exam Updated Vital Signs BP 130/84   Pulse 81   Temp 99.7 F (37.6 C) (Oral)   Resp (!) 9   Ht 5' 2"  (1.575 m)   Wt 68.9 kg   LMP 04/10/2021   SpO2 100%   Breastfeeding Unknown   BMI 27.80 kg/m   Physical Exam Constitutional:      General: She is not in acute distress. HENT:     Head: Normocephalic and atraumatic.  Eyes:     Conjunctiva/sclera: Conjunctivae normal.     Pupils: Pupils are equal, round, and reactive to light.  Cardiovascular:     Rate and Rhythm: Normal rate and regular rhythm.  Pulmonary:     Effort: Pulmonary effort is normal. No respiratory distress.  Abdominal:     General: There is no distension.     Tenderness: There is no abdominal tenderness.  Genitourinary:    Comments: Mild induration and swelling of left labia majora, no fluctuant  pocket, tender to palpation Skin:    General: Skin is warm and dry.  Neurological:     General: No focal deficit present.     Mental Status: She is alert and oriented to person, place, and time. Mental status is at baseline.  Psychiatric:        Mood and Affect: Mood normal.        Behavior: Behavior normal.    ED Results / Procedures /  Treatments   Labs (all labs ordered are listed, but only abnormal results are displayed) Labs Reviewed  COMPREHENSIVE METABOLIC PANEL - Abnormal; Notable for the following components:      Result Value   Potassium 3.1 (*)    CO2 21 (*)    Calcium 8.6 (*)    Albumin 3.2 (*)    All other components within normal limits  CBC WITH DIFFERENTIAL/PLATELET - Abnormal; Notable for the following components:   WBC 15.3 (*)    Hemoglobin 11.1 (*)    HCT 34.1 (*)    Neutro Abs 12.8 (*)    All other components within normal limits  URINALYSIS, ROUTINE W REFLEX MICROSCOPIC - Abnormal; Notable for the following components:   APPearance HAZY (*)    Specific Gravity, Urine 1.003 (*)    Hgb urine dipstick SMALL (*)    Ketones, ur 20 (*)    Leukocytes,Ua LARGE (*)    WBC, UA >50 (*)    Bacteria, UA RARE (*)    All other components within normal limits  RESP PANEL BY RT-PCR (FLU A&B, COVID) ARPGX2  URINE CULTURE  PREGNANCY, URINE  TROPONIN I (HIGH SENSITIVITY)    EKG EKG Interpretation  Date/Time:  Thursday May 03 2021 10:38:37 EST Ventricular Rate:  96 PR Interval:  146 QRS Duration: 93 QT Interval:  347 QTC Calculation: 439 R Axis:   42 Text Interpretation: Sinus rhythm Borderline T wave abnormalities Confirmed by Octaviano Glow [phone]) on 05/03/2021 10:56:34 AM  Radiology CT ABDOMEN PELVIS W CONTRAST  Result Date: 05/03/2021 CLINICAL DATA:  30 year old female with acute abdominal and pelvic pain with fever. History of Crohn's disease and recurrent Bartholin abscess/UTI. EXAM: CT ABDOMEN AND PELVIS WITH CONTRAST TECHNIQUE: Multidetector  CT imaging of the abdomen and pelvis was performed using the standard protocol following bolus administration of intravenous contrast. CONTRAST:  68m OMNIPAQUE IOHEXOL 350 MG/ML SOLN COMPARISON:  09/30/2020 CT and prior studies FINDINGS: Lower chest: No acute abnormality. Hepatobiliary: The liver and gallbladder are unremarkable. No biliary dilatation. Pancreas: Unremarkable Spleen: Unremarkable Adrenals/Urinary Tract: The kidneys, adrenal glands and bladder are unremarkable except for stable probable small LEFT renal cyst. Stomach/Bowel: Circumferential wall thickening of anterior pelvic small bowel loops have decreased since 09/30/2020, now very mild and without adjacent inflammation. Stomach is within normal limits. Appendix appears normal. No new bowel wall thickening, distention, or inflammatory changes. No definite bowel fistulas are noted. Vascular/Lymphatic: No significant vascular findings are present. No enlarged abdominal or pelvic lymph nodes. Reproductive: A 2 x 4.8 cm rim enhancing collection along the LEFT vagina/labia is suggestive of an abscess. No definite fistulas to the bowel are noted. No significant uterine or adnexal abnormalities are noted. Other: No ascites or pneumoperitoneum. Musculoskeletal: No acute or suspicious bony abnormalities are noted. IMPRESSION: 1. 2 x 4.8 cm rim enhancing collection along the LEFT vagina/labia which may represent an abscess. No definite bowel fistulas. 2. Decreased circumferential wall thickening of anterior pelvic small bowel loops since 09/30/2020, now very mild and without adjacent inflammation. No evidence of bowel obstruction. 3. No other significant abnormalities. Electronically Signed   By: JMargarette CanadaM.D.   On: 05/03/2021 15:24   DG Chest Portable 1 View  Result Date: 05/03/2021 CLINICAL DATA:  Near syncope EXAM: PORTABLE CHEST 1 VIEW COMPARISON:  03/13/2019 FINDINGS: The heart size and mediastinal contours are within normal limits. Both lungs  are clear. The visualized skeletal structures are unremarkable. IMPRESSION: No active disease. Electronically Signed   By: NDavina Poke  D.O.   On: 05/03/2021 11:28    Procedures Procedures   Medications Ordered in ED Medications  sodium chloride 0.9 % bolus 1,000 mL (0 mLs Intravenous Stopped 05/03/21 1158)  ketorolac (TORADOL) 30 MG/ML injection 30 mg (30 mg Intravenous Given 05/03/21 1059)  potassium chloride 10 mEq in 100 mL IVPB (0 mEq Intravenous Stopped 05/03/21 1416)  cefTRIAXone (ROCEPHIN) 2 g in sodium chloride 0.9 % 100 mL IVPB (0 g Intravenous Stopped 05/03/21 1347)  iohexol (OMNIPAQUE) 350 MG/ML injection 75 mL (75 mLs Intravenous Contrast Given 05/03/21 1452)  sodium chloride (PF) 0.9 % injection (  Given by Other 05/03/21 1528)  lidocaine-EPINEPHrine-tetracaine (LET) topical gel (3 mLs Topical Given 05/03/21 1608)  HYDROmorphone (DILAUDID) injection 0.5 mg (0.5 mg Intravenous Given 05/03/21 1608)    ED Course  I have reviewed the triage vital signs and the nursing notes.  Pertinent labs & imaging results that were available during my care of the patient were reviewed by me and considered in my medical decision making (see chart for details).  Patient is here with near-syncope.  Differential diagnosis includes anemia versus bacterial infection including viral illness versus other.  I personally reviewed her prior medical records as noted above.  Labs as noted below.  UA suggestive of UTI.  We will send a urine culture.  I do not see evidence of kidney infection or pyelonephritis on CT scan or clinically with no CVA tenderness.  I do not see evidence of a GI perforation.  No clear sign of fistula formation on her CT with oral contrast.  She did have a Bartholin abscess which we were able to drain moderately.  I will start her on Keflex.  She received IV Rocephin, IV fluids, and IV potassium in the emergency department for UTI and hypokalemia (mild) and dehydration.  Her  pregnancy was negative, COVID and flu are negative.  Low suspicion based on his work-up for retained products of conception.  Troponin was also unremarkable.  I doubt ACS or pulmonary embolism.  Patient's husband and that her mother were present at the bedside for her work-up in the ER.  Her pain was controlled with IV medications.  EKG per my interpretation showed a sinus rhythm no acute ischemic findings or evidence of arrhythmia  Clinical Course as of 05/03/21 1632  Thu May 03, 2021  1131 COVID and flu are negative.  Patient does have a leukocytosis.  We are awaiting a UA.  I have ordered a CT abdomen pelvis with IV and oral contrast to evaluate for possible fistula formation, Crohn's flareup, or retained POC [MT]  1211 Leukocytes,Ua(!): LARGE [MT]  1211 Urine culture which appears to be a clean-catch is strongly suggestive of UTI, noting large leukocytes and many white blood cell count and bacteria.  Will order rocephin here [MT]  1226 Pt reports bartholin abscess has begun spontaneously draining [MT]  1412 Preg Test, Ur: NEGATIVE [MT]  1528 IMPRESSION: 1. 2 x 4.8 cm rim enhancing collection along the LEFT vagina/labia which may represent an abscess. No definite bowel fistulas. 2. Decreased circumferential wall thickening of anterior pelvic small bowel loops since 09/30/2020, now very mild and without adjacent inflammation. No evidence of bowel obstruction. 3. No other significant abnormalities [MT]  1628 We were able to drain a moderate amount of pus out of the Bartholin abscess.  We will start the patient on Keflex at home for cross coverage of skin and soft tissue infection as well as suspected cystitis.  Pain medications provided.  Mother to take her home.  We will also start her on potassium for a few days for hypokalemia.  They verbalized understanding.  I did sign clinically of a lower suspicion for sepsis, PE, or ACS. Her vital signs and blood pressure have been stable.  Return precautions  given for syncope [MT]    Clinical Course User Index [MT] Langston Masker Carola Rhine, MD    Final Clinical Impression(s) / ED Diagnoses Final diagnoses:  Bartholin's gland abscess  Cystitis  Near syncope  Hypokalemia    Rx / DC Orders ED Discharge Orders          Ordered    cephALEXin (KEFLEX) 500 MG capsule  2 times daily        05/03/21 1611    cephALEXin (KEFLEX) 500 MG capsule  2 times daily        05/03/21 1628    oxyCODONE-acetaminophen (PERCOCET/ROXICET) 5-325 MG tablet  Every 8 hours PRN        05/03/21 1628    potassium chloride (KLOR-CON) 10 MEQ tablet  Daily        05/03/21 1628             Wyvonnia Dusky, MD 05/03/21 [phone]

## 2021-05-03 NOTE — Discharge Instructions (Addendum)
You were diagnosed with a urine infection as well as an abscess today.  Tomorrow morning you will start taking the cephalexin antibiotic prescribed for both urine infection and skin/abscess infection.  Please finish the full course of antibiotics.  Follow up with your OBGYN as scheduled.  Your potassium level was a little low today.  We gave you IV potassium.  You should also take a daily potassium pill for the next 30 days.  Schedule follow-up appointment your primary care doctor soon as possible for this visit.  You should talk to your doctor about your lightheadedness as well.  *  You should begin to feel better after these antibiotics for your infection.  If you feel very lightheaded, woozy, drowsy, or begin having confusion, or loss of consciousness, or begin having difficulty breathing, or severe vomiting and cannot keep down fluids, please call 911 or return to the ER.  These may be signs of a more serious infection or blood infection that need close attention.  Your covid and flu test were negative.

## 2021-05-03 NOTE — ED Notes (Signed)
Patient transported to CT 

## 2021-05-03 NOTE — ED Triage Notes (Signed)
Pt BIB [facility] from home. Pt c/o near syncope episode. Pt states that she was in the bathroom when she became lightheaded and slid down top the floor. Pt denies losing consciousness. Pt states that she had chills yesterday and sweated in her sleep. Pt has hx of chron's disease. EMS placed a 20g in left AC.   Vitals were: 117/60 116-CBG 90-HR

## 2021-05-05 LAB — URINE CULTURE

## 2021-05-09 ENCOUNTER — Encounter: Payer: Medicaid Other | Admitting: Family Medicine

## 2021-06-06 ENCOUNTER — Ambulatory Visit: Payer: Medicaid Other | Admitting: Certified Nurse Midwife

## 2021-10-03 ENCOUNTER — Other Ambulatory Visit: Payer: Self-pay | Admitting: Family Medicine

## 2021-10-03 DIAGNOSIS — R1013 Epigastric pain: Secondary | ICD-10-CM

## 2021-10-10 ENCOUNTER — Ambulatory Visit: Payer: Medicaid Other

## 2021-10-28 ENCOUNTER — Ambulatory Visit
Admission: EM | Admit: 2021-10-28 | Discharge: 2021-10-28 | Disposition: A | Discharge status: Home / Self Care | Payer: 59 | Attending: Internal Medicine | Admitting: Internal Medicine

## 2021-10-28 ENCOUNTER — Other Ambulatory Visit: Payer: Self-pay

## 2021-10-28 DIAGNOSIS — Z113 Encounter for screening for infections with a predominantly sexual mode of transmission: Secondary | ICD-10-CM | POA: Diagnosis present

## 2021-10-28 DIAGNOSIS — N898 Other specified noninflammatory disorders of vagina: Secondary | ICD-10-CM | POA: Insufficient documentation

## 2021-10-28 DIAGNOSIS — Z3201 Encounter for pregnancy test, result positive: Secondary | ICD-10-CM | POA: Insufficient documentation

## 2021-10-28 LAB — POCT URINE PREGNANCY: Preg Test, Ur: POSITIVE — AB

## 2021-10-28 NOTE — ED Provider Notes (Signed)
[facility]    CSN: [id number] Arrival date & time: 10/28/21  0808      History   Chief Complaint Chief Complaint  Patient presents with   sti screening    HPI Sharon Clayton is a 31 y.o. female.   Patient presents with vaginal discharge and vaginal itching that started approximately 3 days ago.  Denies dysuria, urinary frequency, abdominal pain, hematuria, abnormal vaginal bleeding, vaginal lesions, back pain, fever.  Denies any known exposure to STD but patient would like STD vaginal swab testing given recent unprotected sexual intercourse.  Last menstrual cycle was 09/21/2021.    Past Medical History:  Diagnosis Date   Bacterial infection 09/10/11   Crohn's disease ([place])    H/O varicella 09/10/11   Hypotension    IBS (irritable bowel syndrome)    Low iron 09/10/11   Trichomonas 09/10/10   Yeast infection 09/10/11    Patient Active Problem List   Diagnosis Date Noted   Trichomonal vaginitis during pregnancy 03/04/2021   Vaginal discharge 12/25/2020   Bartholin cyst 07/16/2018   Crohn's disease with complication ([place] ) 48/18/5631   S/P cesarean section 09/04/2013   Smoker 06/08/2013   IBS (irritable bowel syndrome) 06/08/2013   Uses marijuana 04/13/2013    Past Surgical History:  Procedure Laterality Date   CESAREAN SECTION  09/10/11   NRFHT - primary low transverse cesarean section    CESAREAN SECTION N/A 09/04/2013   Procedure: CESAREAN SECTION- repeat;  Surgeon: Osborne Oman, MD;  Location: [facility];  Service: Obstetrics;  Laterality: N/A;    OB History     Gravida  5   Para  2   Term  2   Preterm  0   AB  2   Living  2      SAB  1   IAB  1   Ectopic  0   Multiple  0   Live Births  2            Home Medications    Prior to Admission medications   Medication Sig Start Date End Date Taking? Authorizing Provider  fluticasone (FLONASE) 50 MCG/ACT nasal spray Place 2 sprays into both nostrils daily as needed for allergies.     [provider]  ibuprofen (ADVIL) 200 MG tablet Take 800 mg by mouth every 6 (six) hours as needed for fever, headache or mild pain.    [provider]  oxyCODONE-acetaminophen (PERCOCET/ROXICET) 5-325 MG tablet Take 1 tablet by mouth every 8 (eight) hours as needed for up to 12 doses for severe pain. 05/03/21   Wyvonnia Dusky, MD  potassium chloride (KLOR-CON) 10 MEQ tablet Take 1 tablet (10 mEq total) by mouth daily for 30 doses. 05/03/21 06/02/21  Wyvonnia Dusky, MD  Prenatal Vit-Fe Fumarate-FA (PRENATAL PO) Take 1 tablet by mouth daily.    [provider]    Family History Family History  Problem Relation Age of Onset   Hypertension Mother    Hypertension Father    Colon cancer Neg Hx     Social History Social History   Tobacco Use   Smoking status: Every Day    Types: E-cigarettes   Smokeless tobacco: Never   Tobacco comments:    e-cig  Vaping Use   Vaping Use: Never used  Substance Use Topics   Alcohol use: Not Currently    Comment: occ   Drug use: Not Currently    Types: Marijuana     Allergies  Lactose intolerance (gi) and Flagyl [metronidazole]   Review of Systems Review of Systems Per HPI  Physical Exam Triage Vital Signs ED Triage Vitals [10/28/21 0840]  Enc Vitals Group     BP 101/67     Pulse Rate 70     Resp 18     Temp 98.2 F (36.8 C)     Temp Source Oral     SpO2 98 %     Weight      Height      Head Circumference      Peak Flow      Pain Score 0     Pain Loc      Pain Edu?      Excl. in [place]?    No data found.  Updated Vital Signs BP 101/67 (BP Location: Right Arm)   Pulse 70   Temp 98.2 F (36.8 C) (Oral)   Resp 18   LMP 01/21/2021   SpO2 98%   Breastfeeding No   Visual Acuity Right Eye Distance:   Left Eye Distance:   Bilateral Distance:    Right Eye Near:   Left Eye Near:    Bilateral Near:     Physical Exam Constitutional:      General: She is not in acute distress.     Appearance: Normal appearance. She is not toxic-appearing or diaphoretic.  HENT:     Head: Normocephalic and atraumatic.  Eyes:     Extraocular Movements: Extraocular movements intact.     Conjunctiva/sclera: Conjunctivae normal.  Cardiovascular:     Rate and Rhythm: Normal rate and regular rhythm.     Pulses: Normal pulses.     Heart sounds: Normal heart sounds.  Pulmonary:     Effort: Pulmonary effort is normal. No respiratory distress.     Breath sounds: Normal breath sounds.  Abdominal:     General: Bowel sounds are normal. There is no distension.     Palpations: Abdomen is soft.     Tenderness: There is no abdominal tenderness.  Genitourinary:    Comments: Deferred with shared decision making.  Self swab performed. Neurological:     General: No focal deficit present.     Mental Status: She is alert and oriented to person, place, and time. Mental status is at baseline.  Psychiatric:        Mood and Affect: Mood normal.        Behavior: Behavior normal.        Thought Content: Thought content normal.        Judgment: Judgment normal.     UC Treatments / Results  Labs (all labs ordered are listed, but only abnormal results are displayed) Labs Reviewed  POCT URINE PREGNANCY - Abnormal; Notable for the following components:      Result Value   Preg Test, Ur Positive (*)    All other components within normal limits  HCG, QUANTITATIVE, PREGNANCY  CERVICOVAGINAL ANCILLARY ONLY    EKG   Radiology No results found.  Procedures Procedures (including critical care time)  Medications Ordered in UC Medications - No data to display  Initial Impression / Assessment and Plan / UC Course  I have reviewed the triage vital signs and the nursing notes.  Pertinent labs & imaging results that were available during my care of the patient were reviewed by me and considered in my medical decision making (see chart for details).     Cervicovaginal swab pending.  Will await  results for any  treatment at this time.  Do not think that urinalysis is necessary given no urinary symptoms at present.  Pregnancy test was positive and this was discussed with patient.  Patient stated that she took a pill for termination of pregnancy after beginning of April.  She had a follow-up appointment approximately 1 week ago with a clinic that prescribed the medication, she had a positive pregnancy test at that time.  She was advised that this is normal following termination of pregnancy.  She does not have any other follow-up appointments with clinic per patient.  Advised patient that pregnancy test could be positive given recent termination of pregnancy, but it is a possibility that it could be a new pregnancy given that has been a long duration of time since she took the pill to terminate pregnancy.  Will complete quantitative hCG.  Patient was also advised to follow-up with women's clinic that she has recently been seeing for further evaluation and management of this.  Unable to adequately review clinic visits that patient is referring to.  Discussed return precautions.  Patient verbalized understanding and was agreeable with plan.  Final Clinical Impressions(s) / UC Diagnoses   Final diagnoses:  Vaginal discharge  Vaginal itching  Screening examination for venereal disease  Positive urine pregnancy test     Discharge Instructions      Your vaginal swab is pending.  We will call if results are positive and treat as appropriate.  Please refrain from sexual activity until test results and treatment are complete.  Your urine pregnancy test was positive today.  It is a possibility that it could be positive from your recent pregnancy, although please follow-up with clinic that has been evaluating you for further evaluation and management.    ED Prescriptions   None    PDMP not reviewed this encounter.   Teodora Medici, [place] 10/28/21 [phone]

## 2021-10-28 NOTE — ED Triage Notes (Signed)
Pt c/o vaginal white discharge, pruritic at "entrance of vagina,"  Denies vaginal odor, dysuria, frequency, pelvic pain, lower back pain, hematuria,   Onset ~ 3 days

## 2021-10-28 NOTE — Discharge Instructions (Addendum)
Your vaginal swab is pending.  We will call if results are positive and treat as appropriate.  Please refrain from sexual activity until test results and treatment are complete.  Your urine pregnancy test was positive today.  It is a possibility that it could be positive from your recent pregnancy, although please follow-up with clinic that has been evaluating you for further evaluation and management.

## 2021-10-29 LAB — CERVICOVAGINAL ANCILLARY ONLY
Bacterial Vaginitis (gardnerella): NEGATIVE
Candida Glabrata: NEGATIVE
Candida Vaginitis: POSITIVE — AB
Chlamydia: NEGATIVE
Comment: NEGATIVE
Comment: NEGATIVE
Comment: NEGATIVE
Comment: NEGATIVE
Comment: NEGATIVE
Comment: NORMAL
Neisseria Gonorrhea: NEGATIVE
Trichomonas: NEGATIVE

## 2021-10-30 ENCOUNTER — Telehealth (HOSPITAL_COMMUNITY): Payer: Self-pay | Admitting: Emergency Medicine

## 2021-10-30 MED ORDER — FLUCONAZOLE 150 MG PO TABS: 150.0000 mg | ORAL_TABLET | Freq: Once | ORAL | 0 refills | Status: AC

## 2021-10-31 LAB — BETA HCG QUANT (REF LAB): hCG Quant: 16 m[IU]/mL

## 2021-10-31 LAB — SPECIMEN STATUS REPORT

## 2021-11-09 DIAGNOSIS — K5000 Crohn's disease of small intestine without complications: Secondary | ICD-10-CM | POA: Diagnosis not present

## 2021-11-09 DIAGNOSIS — R1031 Right lower quadrant pain: Secondary | ICD-10-CM | POA: Diagnosis present

## 2021-11-09 DIAGNOSIS — E876 Hypokalemia: Secondary | ICD-10-CM | POA: Insufficient documentation

## 2021-11-10 ENCOUNTER — Other Ambulatory Visit: Payer: Self-pay

## 2021-11-10 ENCOUNTER — Emergency Department (HOSPITAL_COMMUNITY)
Admission: EM | Admit: 2021-11-10 | Discharge: 2021-11-10 | Disposition: A | Discharge status: Home / Self Care | Payer: 59 | Attending: Emergency Medicine | Admitting: Emergency Medicine

## 2021-11-10 ENCOUNTER — Encounter (HOSPITAL_COMMUNITY): Payer: Self-pay

## 2021-11-10 ENCOUNTER — Emergency Department (HOSPITAL_COMMUNITY): Payer: 59

## 2021-11-10 DIAGNOSIS — K5000 Crohn's disease of small intestine without complications: Secondary | ICD-10-CM | POA: Diagnosis not present

## 2021-11-10 LAB — URINALYSIS, ROUTINE W REFLEX MICROSCOPIC
Bilirubin Urine: NEGATIVE
Glucose, UA: NEGATIVE mg/dL
Ketones, ur: 5 mg/dL — AB
Nitrite: NEGATIVE
Protein, ur: 30 mg/dL — AB
Specific Gravity, Urine: 1.005 (ref 1.005–1.030)
WBC, UA: >50 WBC/hpf — ABNORMAL HIGH (ref 0–5)
pH: 6 (ref 5.0–8.0)

## 2021-11-10 LAB — CBC
HCT: 36 % (ref 36.0–46.0)
Hemoglobin: 12.3 g/dL (ref 12.0–15.0)
MCH: 30.6 pg (ref 26.0–34.0)
MCHC: 34.2 g/dL (ref 30.0–36.0)
MCV: 89.6 fL (ref 80.0–100.0)
Platelets: 294 10*3/uL (ref 150–400)
RBC: 4.02 MIL/uL (ref 3.87–5.11)
RDW: 13.1 % (ref 11.5–15.5)
WBC: 6.3 10*3/uL (ref 4.0–10.5)
nRBC: 0 % (ref 0.0–0.2)

## 2021-11-10 LAB — I-STAT BETA HCG BLOOD, ED (MC, WL, AP ONLY): I-stat hCG, quantitative: <5 m[IU]/mL (ref <5)

## 2021-11-10 LAB — COMPREHENSIVE METABOLIC PANEL
ALT: 25 U/L (ref 0–44)
AST: 22 U/L (ref 15–41)
Albumin: 4.3 g/dL (ref 3.5–5.0)
Alkaline Phosphatase: 57 U/L (ref 38–126)
Anion gap: 6 (ref 5–15)
BUN: 8 mg/dL (ref 6–20)
CO2: 24 mmol/L (ref 22–32)
Calcium: 9.6 mg/dL (ref 8.9–10.3)
Chloride: 110 mmol/L (ref 98–111)
Creatinine, Ser: 0.84 mg/dL (ref 0.44–1.00)
GFR, Estimated: >60 mL/min (ref >60)
Glucose, Bld: 102 mg/dL — ABNORMAL HIGH (ref 70–99)
Potassium: 3.2 mmol/L — ABNORMAL LOW (ref 3.5–5.1)
Sodium: 140 mmol/L (ref 135–145)
Total Bilirubin: 1 mg/dL (ref 0.3–1.2)
Total Protein: 7.7 g/dL (ref 6.5–8.1)

## 2021-11-10 LAB — LIPASE, BLOOD: Lipase: 29 U/L (ref 11–51)

## 2021-11-10 MED ORDER — SULFAMETHOXAZOLE-TRIMETHOPRIM 800-160 MG PO TABS: 1.0000 | ORAL_TABLET | Freq: Two times a day (BID) | ORAL | 0 refills | Status: AC

## 2021-11-10 MED ORDER — MORPHINE SULFATE (PF) 2 MG/ML IV SOLN
2.0000 mg | Freq: Once | INTRAVENOUS | Status: AC
Start: 2021-11-10 — End: 2021-11-10
  Administered 2021-11-10: 2 mg via INTRAVENOUS
  Filled 2021-11-10: qty 1

## 2021-11-10 MED ORDER — PREDNISONE 10 MG PO TABS: 20.0000 mg | ORAL_TABLET | Freq: Every day | ORAL | 0 refills | Status: AC

## 2021-11-10 MED ORDER — ONDANSETRON HCL 4 MG/2ML IJ SOLN
4.0000 mg | Freq: Once | INTRAMUSCULAR | Status: AC
  Administered 2021-11-10: 4 mg via INTRAVENOUS
  Filled 2021-11-10: qty 2

## 2021-11-10 MED ORDER — MORPHINE SULFATE (PF) 4 MG/ML IV SOLN
4.0000 mg | Freq: Once | INTRAVENOUS | Status: AC
Start: 2021-11-10 — End: 2021-11-10
  Administered 2021-11-10: 4 mg via INTRAVENOUS
  Filled 2021-11-10: qty 1

## 2021-11-10 MED ORDER — SODIUM CHLORIDE 0.9 % IV SOLN
1.0000 g | Freq: Once | INTRAVENOUS | Status: AC
  Administered 2021-11-10: 1 g via INTRAVENOUS
  Filled 2021-11-10: qty 10

## 2021-11-10 MED ORDER — SODIUM CHLORIDE 0.9 % IV BOLUS
1000.0000 mL | Freq: Once | INTRAVENOUS | Status: AC
  Administered 2021-11-10: 1000 mL via INTRAVENOUS

## 2021-11-10 MED ORDER — PREDNISONE 20 MG PO TABS
20.0000 mg | ORAL_TABLET | Freq: Once | ORAL | Status: AC
Start: 2021-11-10 — End: 2021-11-10
  Administered 2021-11-10: 20 mg via ORAL
  Filled 2021-11-10: qty 1

## 2021-11-10 MED ORDER — IOHEXOL 300 MG/ML  SOLN
100.0000 mL | Freq: Once | INTRAMUSCULAR | Status: AC | PRN
  Administered 2021-11-10: 100 mL via INTRAVENOUS

## 2021-11-10 MED ORDER — LACTATED RINGERS IV BOLUS: 1000.0000 mL | Freq: Once | INTRAVENOUS | Status: DC

## 2021-11-10 NOTE — Discharge Instructions (Addendum)
You were found to be having a Crohn's flare today in the ER.  Please follow up with Dr. Alan Ripper as soon as possible to discuss further management of your Crohn's flare.  Regarding your urinary tract infection please take the prescribed  antibiotic for the entire course.  Return to the ER with any severe symptoms.

## 2021-11-10 NOTE — ED Provider Notes (Signed)
[facility] DEPT Provider Note   CSN: [id number] Arrival date & time: 11/09/21  2352     History  Chief Complaint  Patient presents with   Abdominal Pain    Sharon Clayton is a 31 y.o. female with history of  Crohn's disease who presents with concern for sudden onset of pain this evening with gradual worsening throughout the night, pain in her right lower abdomen.  Belching a lot, passing less gas than normal.  No bowel movement since onset of pain.  Nauseous but not vomiting, no fevers or chills.  No ill contacts that she is aware of.  No hematuria but urinary urgency, frequency, and dysuria.  Using over-the-counter UTI medications.  I personally reviewed this patient's medical records.  She has history of C-section x2 and known Bartholin cyst.  She follows with [facility] Dr. [name], most recent note listed as in 2017.   HPI     Home Medications Prior to Admission medications   Medication Sig Start Date End Date Taking? Authorizing Provider  ferrous sulfate 325 (65 FE) MG tablet Take 325 mg by mouth every morning. 09/21/21  Yes [provider]  Multiple Vitamin (MULTIVITAMIN ADULT PO) Take 1 tablet by mouth daily.   Yes [provider]  omeprazole (PRILOSEC OTC) 20 MG tablet Take 20 mg by mouth daily as needed (acid reflux).   Yes [provider]  predniSONE (DELTASONE) 10 MG tablet Take 2 tablets (20 mg total) by mouth daily for 7 days. 11/10/21 11/17/21 Yes Naylani Bradner, Gypsy Balsam, PA-C  Prenatal Vit-Fe Fumarate-FA (PRENATAL PO) Take 1 tablet by mouth daily.   Yes [provider]  sulfamethoxazole-trimethoprim (BACTRIM DS) 800-160 MG tablet Take 1 tablet by mouth 2 (two) times daily for 5 days. 11/10/21 11/15/21 Yes Vanassa Penniman, Gypsy Balsam, PA-C  oxyCODONE-acetaminophen (PERCOCET/ROXICET) 5-325 MG tablet Take 1 tablet by mouth every 8 (eight) hours as needed for up to 12 doses for severe pain. Patient not taking: Reported on  11/10/2021 05/03/21   Wyvonnia Dusky, MD  potassium chloride (KLOR-CON) 10 MEQ tablet Take 1 tablet (10 mEq total) by mouth daily for 30 doses. 05/03/21 06/02/21  Wyvonnia Dusky, MD      Allergies    Lactose intolerance (gi) and Flagyl [metronidazole]    Review of Systems   Review of Systems  Constitutional:  Positive for appetite change. Negative for activity change and fever.  HENT: Negative.    Respiratory: Negative.    Gastrointestinal:  Positive for abdominal distention, abdominal pain and nausea. Negative for anal bleeding, blood in stool, constipation, diarrhea and vomiting.  Genitourinary:  Positive for dysuria, frequency and urgency. Negative for decreased urine volume, flank pain and hematuria.  Musculoskeletal: Negative.   Neurological: Negative.    Physical Exam Updated Vital Signs BP (!) 121/91 (BP Location: Right Arm)   Pulse 67   Temp (!) 97.4 F (36.3 C) (Oral)   Resp 17   Ht 5' 2"  (1.575 m)   Wt 72.6 kg   LMP 01/21/2021   SpO2 99%   BMI 29.26 kg/m  Physical Exam Vitals and nursing note reviewed.  Constitutional:      Appearance: She is not toxic-appearing.  HENT:     Head: Normocephalic and atraumatic.     Mouth/Throat:     Mouth: Mucous membranes are moist.     Pharynx: No oropharyngeal exudate or posterior oropharyngeal erythema.  Eyes:     General:  Right eye: No discharge.        Left eye: No discharge.     Extraocular Movements: Extraocular movements intact.     Conjunctiva/sclera: Conjunctivae normal.     Pupils: Pupils are equal, round, and reactive to light.  Cardiovascular:     Rate and Rhythm: Normal rate and regular rhythm.     Pulses: Normal pulses.  Pulmonary:     Effort: Pulmonary effort is normal. No respiratory distress.     Breath sounds: Normal breath sounds. No wheezing or rales.  Abdominal:     General: Bowel sounds are normal. There is no distension.     Tenderness: There is generalized abdominal tenderness and  tenderness in the right lower quadrant and periumbilical area.     Hernia: No hernia is present.  Musculoskeletal:        General: No deformity.     Cervical back: Neck supple.  Skin:    General: Skin is warm and dry.     Capillary Refill: Capillary refill takes less than 2 seconds.  Neurological:     General: No focal deficit present.     Mental Status: She is alert and oriented to person, place, and time. Mental status is at baseline.  Psychiatric:        Mood and Affect: Mood normal.    ED Results / Procedures / Treatments   Labs (all labs ordered are listed, but only abnormal results are displayed) Labs Reviewed  COMPREHENSIVE METABOLIC PANEL - Abnormal; Notable for the following components:      Result Value   Potassium 3.2 (*)    Glucose, Bld 102 (*)    All other components within normal limits  URINALYSIS, ROUTINE W REFLEX MICROSCOPIC - Abnormal; Notable for the following components:   APPearance HAZY (*)    Hgb urine dipstick LARGE (*)    Ketones, ur 5 (*)    Protein, ur 30 (*)    Leukocytes,Ua MODERATE (*)    WBC, UA >50 (*)    Bacteria, UA RARE (*)    All other components within normal limits  LIPASE, BLOOD  CBC  I-STAT BETA HCG BLOOD, ED ([facility], [facility], [facility] ONLY)    EKG None  Radiology CT Abdomen Pelvis W Contrast  Result Date: 11/10/2021 CLINICAL DATA:  Right lower quadrant pain.  Crohn's disease. EXAM: CT ABDOMEN AND PELVIS WITH CONTRAST TECHNIQUE: Multidetector CT imaging of the abdomen and pelvis was performed using the standard protocol following bolus administration of intravenous contrast. RADIATION DOSE REDUCTION: This exam was performed according to the departmental dose-optimization program which includes automated exposure control, adjustment of the mA and/or kV according to patient size and/or use of iterative reconstruction technique. CONTRAST:  137m OMNIPAQUE IOHEXOL 300 MG/ML  SOLN COMPARISON:  CT with IV contrast 05/03/2021, 09/30/2020. FINDINGS: Lower  chest: No acute abnormality. Hepatobiliary: 18 cm length liver with mild steatosis without mass enhancement. Gallbladder and bile ducts are unremarkable. Pancreas: No focal abnormality or ductal dilatation. Spleen: No focal abnormality or splenomegaly. Adrenals/Urinary Tract: There is a 1.6 cm cyst in the inferior pole of the left kidney which was previously 1.2 cm. There is no adrenal mass or other focal renal cortical abnormality. There is no urinary stone or obstruction. The bladder thickness is normal. Stomach/Bowel: No dilated small bowel is seen. There is mild enhancing mucosa in the small bowel in the right lower abdominopelvic quadrant but it is not significantly thickened. The gastric wall is contracted. The appendix is normal. There is  no colonic thickening. Vascular/Lymphatic: Slightly prominent lymph nodes along the mesenteric root again measure up to 8 mm in short axis. This was seen previously. No bulky or encasing adenopathy is seen. No significant vascular findings. Reproductive: The uterus is intact. The ovaries are not enlarged. There are numerous pelvic phleboliths. There was previously 3.7 cm left ovarian cyst which is no longer seen. There is an ovoid rim enhancing fluid collection left of the midline in the posterolateral base of the vagina measuring 5.8 Hounsfield units and 3.8 x 1.9 cm previously 4.8 x 2 cm. This was present on both prior studies. Correlate clinically for infectious complication. Probable Bartholin cyst. Other: Small amount of low-density pelvic cul-de-sac fluid is again noted and was present on both prior studies most likely physiologic. There are no mesenteric inflammatory changes. There is no free hemorrhage, abscess or free air. There is no visible interloop fistula. There is no incarcerated hernia. Musculoskeletal: Chronic L5 pars defects and slight grade 1 L5-S1 spondylolisthesis. No new bone abnormality or acute abnormality. IMPRESSION: 1. There is mucosal enhancement  and fluid filling of the distal small bowel in the right lower abdominopelvic quadrant which may be due to a mild Crohn's enteritis, but the more pronounced wall thickening which has been seen on prior studies is not seen today nor is there inflammatory change in the mesentery. 2. No bowel obstruction is seen. 3. Slightly prominent chronic mesenteric root nodes. 4. Likely physiologic pelvic cul-de-sac fluid. 5. Chronic cyst in the left posterolateral vaginal base. Probable Bartholin cyst. Electronically Signed   By: Telford Nab M.D.   On: 11/10/2021 04:36    Procedures Procedures    Medications Ordered in ED Medications  ondansetron (ZOFRAN) injection 4 mg (4 mg Intravenous Given 11/10/21 0343)  cefTRIAXone (ROCEPHIN) 1 g in sodium chloride 0.9 % 100 mL IVPB (0 g Intravenous Stopped 11/10/21 0556)  morphine (PF) 4 MG/ML injection 4 mg (4 mg Intravenous Given 11/10/21 0346)  sodium chloride 0.9 % bolus 1,000 mL (0 mLs Intravenous Stopped 11/10/21 0650)  iohexol (OMNIPAQUE) 300 MG/ML solution 100 mL (100 mLs Intravenous Contrast Given 11/10/21 0401)  predniSONE (DELTASONE) tablet 20 mg (20 mg Oral Given 11/10/21 0635)  morphine (PF) 2 MG/ML injection 2 mg (2 mg Intravenous Given 11/10/21 0093)    ED Course/ Medical Decision Making/ A&P Clinical Course as of 11/10/21 0715  Sat Nov 10, 2021  0619 Consult Dr. Therisa Doyne, gastroenterologist who recommends prednisone 20 mg daily x 1 week, and very close interim follow up with the patient's primary GI.  I appreciate her collaboration in the care of this patient.  [RS]    Clinical Course User Index [RS] Haydon Dorris, Gypsy Balsam, PA-C                           Medical Decision Making 31 year old female presents with concern for right lower abdominal pain that radiates to the back since this evening, history of Crohn's.  Hypertensive monitor, vitals otherwise normal.  Cardiopulmonary exams are normal and abdominal exams are significant for right lower abdominal and  periumbilical tenderness palpation without rebound or guarding, no palpable masses..  Patient is neurologically intact in all extremities.  Visibly uncomfortable, shifting around the bed.    Differential diagnosis includes limited to ectopic pregnancy, molar pregnancy, Crohn's flare, appendicitis, cholecystitis cholangitis is enteric ischemia, small bowel obstruction, large bowel obstruction.   Amount and/or Complexity of Data Reviewed Labs: ordered.    Details: CBC without  leukocytosis or anemia.  CMP with mild hypokalemia 3.2.  Pregnancy test is negative, lipase is normal.  Urine with hemoglobinuria, ketonuria, proteinuria, moderate leukocytes, greater than 50 white cells, will treat his infection with dose of Rocephin in the ED. Radiology: ordered.    Details: CT scan of the abdomen pelvis revealed mucosal enhancement and fluid filling of the distal small bowel in the right lower abdominal/pelvic quadrant likely Crohn's enteritis.  No bowel obstruction.  Images visualized by this provider and agree with radiologist interpretation.  Risk Prescription drug management.   Case gust with gastroenterologist as above.  Patient reevaluated and improved with IV pain medication.  Will discharge with steroids as recommended by GI. Regarding her UTI she was administered her first dose of antibiotics in the ED and was discharged with prescription.  Recommend close interval follow-up with her primary gastroenterologist for further management of her Crohn's flare and strict return precautions are given.   Rhylei and her mother  voiced understanding of her medical evaluation and treatment plan. Each of their questions answered to their expressed satisfaction.  Return precautions were given.  Patient is well-appearing, stable, and was discharged in good condition.  This chart was dictated using voice recognition software, Dragon. Despite the best efforts of this provider to proofread and correct errors,  errors may still occur which can change documentation meaning.   Final Clinical Impression(s) / ED Diagnoses Final diagnoses:  Crohn's disease of small intestine without complication ([place])    Rx / DC Orders ED Discharge Orders          Ordered    predniSONE (DELTASONE) 10 MG tablet  Daily        11/10/21 0621    sulfamethoxazole-trimethoprim (BACTRIM DS) 800-160 MG tablet  2 times daily        11/10/21 0627              Alora Gorey, Gypsy Balsam, PA-C 11/10/21 0715    Merryl Hacker, MD 11/10/21 2256

## 2021-11-10 NOTE — ED Triage Notes (Signed)
Patient has abdominal pain tonight that radiated to her right back. She thought it was gas and she was bloated. But pain never stopped.

## 2022-01-05 ENCOUNTER — Emergency Department (HOSPITAL_COMMUNITY)
Admission: EM | Admit: 2022-01-05 | Discharge: 2022-01-05 | Disposition: A | Discharge status: Home / Self Care | Payer: 59 | Attending: Emergency Medicine | Admitting: Emergency Medicine

## 2022-01-05 ENCOUNTER — Other Ambulatory Visit: Payer: Self-pay

## 2022-01-05 ENCOUNTER — Encounter (HOSPITAL_COMMUNITY): Payer: Self-pay

## 2022-01-05 DIAGNOSIS — R509 Fever, unspecified: Secondary | ICD-10-CM | POA: Insufficient documentation

## 2022-01-05 DIAGNOSIS — R110 Nausea: Secondary | ICD-10-CM | POA: Diagnosis not present

## 2022-01-05 DIAGNOSIS — N751 Abscess of Bartholin's gland: Secondary | ICD-10-CM | POA: Diagnosis present

## 2022-01-05 DIAGNOSIS — D72829 Elevated white blood cell count, unspecified: Secondary | ICD-10-CM | POA: Insufficient documentation

## 2022-01-05 LAB — BASIC METABOLIC PANEL
Anion gap: 4 — ABNORMAL LOW (ref 5–15)
BUN: 9 mg/dL (ref 6–20)
CO2: 25 mmol/L (ref 22–32)
Calcium: 9.6 mg/dL (ref 8.9–10.3)
Chloride: 113 mmol/L — ABNORMAL HIGH (ref 98–111)
Creatinine, Ser: 0.85 mg/dL (ref 0.44–1.00)
GFR, Estimated: >60 mL/min (ref >60)
Glucose, Bld: 115 mg/dL — ABNORMAL HIGH (ref 70–99)
Potassium: 3.4 mmol/L — ABNORMAL LOW (ref 3.5–5.1)
Sodium: 142 mmol/L (ref 135–145)

## 2022-01-05 LAB — CBC
HCT: 35 % — ABNORMAL LOW (ref 36.0–46.0)
Hemoglobin: 12 g/dL (ref 12.0–15.0)
MCH: 30.2 pg (ref 26.0–34.0)
MCHC: 34.3 g/dL (ref 30.0–36.0)
MCV: 88.2 fL (ref 80.0–100.0)
Platelets: 220 10*3/uL (ref 150–400)
RBC: 3.97 MIL/uL (ref 3.87–5.11)
RDW: 12.8 % (ref 11.5–15.5)
WBC: 15.1 10*3/uL — ABNORMAL HIGH (ref 4.0–10.5)
nRBC: 0 % (ref 0.0–0.2)

## 2022-01-05 LAB — MAGNESIUM: Magnesium: 1.9 mg/dL (ref 1.7–2.4)

## 2022-01-05 MED ORDER — KETOROLAC TROMETHAMINE 30 MG/ML IJ SOLN
30.0000 mg | Freq: Once | INTRAMUSCULAR | Status: AC
  Administered 2022-01-05: 30 mg via INTRAVENOUS
  Filled 2022-01-05: qty 1

## 2022-01-05 MED ORDER — DOXYCYCLINE HYCLATE 100 MG PO TABS
100.0000 mg | ORAL_TABLET | Freq: Once | ORAL | Status: AC
  Administered 2022-01-05: 100 mg via ORAL
  Filled 2022-01-05: qty 1

## 2022-01-05 MED ORDER — ONDANSETRON HCL 4 MG/2ML IJ SOLN
4.0000 mg | Freq: Once | INTRAMUSCULAR | Status: AC
Start: 2022-01-05 — End: 2022-01-05
  Administered 2022-01-05: 4 mg via INTRAVENOUS
  Filled 2022-01-05: qty 2

## 2022-01-05 MED ORDER — LIDOCAINE-EPINEPHRINE (PF) 2 %-1:200000 IJ SOLN
INTRAMUSCULAR | Status: AC
  Filled 2022-01-05: qty 20

## 2022-01-05 MED ORDER — LIDOCAINE-EPINEPHRINE-TETRACAINE (LET) TOPICAL GEL
3.0000 mL | Freq: Once | TOPICAL | Status: AC
  Administered 2022-01-05: 3 mL via TOPICAL
  Filled 2022-01-05: qty 3

## 2022-01-05 MED ORDER — OXYCODONE HCL 5 MG PO TABS: 5.0000 mg | ORAL_TABLET | Freq: Four times a day (QID) | ORAL | 0 refills | Status: DC | PRN

## 2022-01-05 MED ORDER — IBUPROFEN 600 MG PO TABS: 600.0000 mg | ORAL_TABLET | Freq: Four times a day (QID) | ORAL | 0 refills | Status: AC | PRN

## 2022-01-05 MED ORDER — SODIUM CHLORIDE 0.9 % IV BOLUS
1000.0000 mL | Freq: Once | INTRAVENOUS | Status: AC
  Administered 2022-01-05: 1000 mL via INTRAVENOUS

## 2022-01-05 MED ORDER — MORPHINE SULFATE (PF) 2 MG/ML IV SOLN
2.0000 mg | Freq: Once | INTRAVENOUS | Status: AC
  Administered 2022-01-05: 2 mg via INTRAVENOUS
  Filled 2022-01-05: qty 1

## 2022-01-05 MED ORDER — DOXYCYCLINE HYCLATE 100 MG PO CAPS: 100.0000 mg | ORAL_CAPSULE | Freq: Two times a day (BID) | ORAL | 0 refills | Status: AC

## 2022-01-05 NOTE — ED Triage Notes (Signed)
Pt reports she had a bartholin's gland abscess back in November that she had to have lanced. Pt states she thinks she may have another one and endorses swelling and pain to this area.

## 2022-01-05 NOTE — Discharge Instructions (Signed)
You should follow-up with an OB/GYN specialist for your recurring Bartholin's infection.  I put you on an antibiotic for the next 7 days to help with the infection.  The incision will continue to ooze and bleed for the next day or 2.  You can apply a fresh pad to this area, you can also wash with normal soap and water at home.  If you begin having worsening fevers, feel like passing out, have worsening pain in your groin, difficulty urinating, or any other emergent medical concerns, please come back to the ER.

## 2022-01-05 NOTE — ED Provider Triage Note (Signed)
Emergency Medicine Provider Triage Evaluation Note  Sharon Clayton , a 31 y.o. female  was evaluated in triage.  Pt complains of recurrent Bartholin's abscess, worsening over the past several days.  States that she has has needed I&D twice before.  She has been on antibiotics in the past.  Currently no fever or chills.  Review of Systems  Positive: Labial swelling Negative: Fever  Physical Exam  BP 136/88 (BP Location: Right Arm)   Pulse (!) 107   Temp 98.4 F (36.9 C) (Oral)   Resp 18   LMP 01/21/2021   SpO2 100%  Gen:   Awake, no distress   Resp:  Normal effort  MSK:   Moves extremities without difficulty  Other:    Medical Decision Making  Medically screening exam initiated at 9:33 AM.  Appropriate orders placed.  [name] was informed that the remainder of the evaluation will be completed by another provider, this initial triage assessment does not replace that evaluation, and the importance of remaining in the ED until their evaluation is complete.     Carlisle Cater, PA-C 01/05/22 [phone]

## 2022-01-05 NOTE — ED Provider Notes (Signed)
[facility] DEPT Provider Note   CSN: [id number] Arrival date & time: 01/05/22  0901     History  Chief Complaint  Patient presents with   Groin Pain    Sharon Clayton is a 31 y.o. female presenting to the ED with complaint of groin pain and feeling unwell.  She reports about 4 to 5 days ago she began having swelling around her left labia, is concerned about a recurring Bartholin abscess, which she has had drained in the past.  She describes some subjective fevers and chills, and feels lightheaded and nauseated as well.  HPI     Home Medications Prior to Admission medications   Medication Sig Start Date End Date Taking? Authorizing Provider  doxycycline (VIBRAMYCIN) 100 MG capsule Take 1 capsule (100 mg total) by mouth 2 (two) times daily for 7 days. 01/05/22 01/12/22 Yes Adrieana Fennelly, Carola Rhine, MD  ibuprofen (ADVIL) 600 MG tablet Take 1 tablet (600 mg total) by mouth every 6 (six) hours as needed for up to 30 doses for mild pain or moderate pain. 01/05/22  Yes Hilarie Sinha, Carola Rhine, MD  oxyCODONE (ROXICODONE) 5 MG immediate release tablet Take 1 tablet (5 mg total) by mouth every 6 (six) hours as needed for up to 12 doses for severe pain. 01/05/22  Yes Wyvonnia Dusky, MD  ferrous sulfate 325 (65 FE) MG tablet Take 325 mg by mouth every morning. 09/21/21   [provider]  Multiple Vitamin (MULTIVITAMIN ADULT PO) Take 1 tablet by mouth daily.    [provider]  omeprazole (PRILOSEC OTC) 20 MG tablet Take 20 mg by mouth daily as needed (acid reflux).    [provider]  oxyCODONE-acetaminophen (PERCOCET/ROXICET) 5-325 MG tablet Take 1 tablet by mouth every 8 (eight) hours as needed for up to 12 doses for severe pain. Patient not taking: Reported on 11/10/2021 05/03/21   Wyvonnia Dusky, MD  potassium chloride (KLOR-CON) 10 MEQ tablet Take 1 tablet (10 mEq total) by mouth daily for 30 doses. 05/03/21 06/02/21  Wyvonnia Dusky, MD   Prenatal Vit-Fe Fumarate-FA (PRENATAL PO) Take 1 tablet by mouth daily.    [provider]      Allergies    Lactose intolerance (gi) and Flagyl [metronidazole]    Review of Systems   Review of Systems  Physical Exam Updated Vital Signs BP 129/80 (BP Location: Right Arm)   Pulse 83   Temp 98.7 F (37.1 C) (Oral)   Resp 18   Ht 5' 2"  (1.575 m)   Wt 69.9 kg   LMP 01/21/2021   SpO2 99%   BMI 28.17 kg/m  Physical Exam Constitutional:      General: She is not in acute distress. HENT:     Head: Normocephalic and atraumatic.  Eyes:     Conjunctiva/sclera: Conjunctivae normal.     Pupils: Pupils are equal, round, and reactive to light.  Cardiovascular:     Rate and Rhythm: Normal rate and regular rhythm.  Pulmonary:     Effort: Pulmonary effort is normal. No respiratory distress.  Abdominal:     General: There is no distension.     Tenderness: There is no abdominal tenderness.  Genitourinary:    Comments: GU exam performed with the patient's nurses chaperone present at the bedside.  She does have a region of induration and fluctuance of the left lower labia, no active drainage. Skin:    General: Skin is warm and dry.  Neurological:  General: No focal deficit present.     Mental Status: She is alert. Mental status is at baseline.  Psychiatric:        Mood and Affect: Mood normal.        Behavior: Behavior normal.     ED Results / Procedures / Treatments   Labs (all labs ordered are listed, but only abnormal results are displayed) Labs Reviewed  BASIC METABOLIC PANEL - Abnormal; Notable for the following components:      Result Value   Potassium 3.4 (*)    Chloride 113 (*)    Glucose, Bld 115 (*)    Anion gap 4 (*)    All other components within normal limits  CBC - Abnormal; Notable for the following components:   WBC 15.1 (*)    HCT 35.0 (*)    All other components within normal limits  MAGNESIUM  URINALYSIS, ROUTINE W REFLEX MICROSCOPIC     EKG None  Radiology No results found.  Procedures .Marland KitchenIncision and Drainage  Date/Time: 01/05/2022 3:31 PM  Performed by: Wyvonnia Dusky, MD Authorized by: Wyvonnia Dusky, MD   Consent:    Consent obtained:  Verbal   Consent given by:  Patient   Risks, benefits, and alternatives were discussed: yes     Risks discussed:  Bleeding, incomplete drainage, infection, pain and damage to other organs   Alternatives discussed:  Delayed treatment and observation Universal protocol:    Procedure explained and questions answered to patient or proxy's satisfaction: yes     Immediately prior to procedure, a time out was called: yes     Patient identity confirmed:  Arm band Location:    Type:  Bartholin cyst   Location:  Anogenital   Anogenital location:  Bartholin's gland Pre-procedure details:    Skin preparation:  Povidone-iodine Sedation:    Sedation type:  None Anesthesia:    Anesthesia method:  Topical application   Topical anesthetic:  LET Procedure type:    Complexity:  Simple Procedure details:    Ultrasound guidance: yes     Incision types:  Single straight   Wound management:  Probed and deloculated and irrigated with saline   Drainage:  Bloody   Drainage amount:  Scant   Wound treatment:  Wound left open   Packing materials:  None Post-procedure details:    Procedure completion:  Tolerated well, no immediate complications     Medications Ordered in ED Medications  lidocaine-EPINEPHrine (XYLOCAINE W/EPI) 2 %-1:200000 (PF) injection (has no administration in time range)  morphine (PF) 2 MG/ML injection 2 mg (2 mg Intravenous Given 01/05/22 1356)  ketorolac (TORADOL) 30 MG/ML injection 30 mg (30 mg Intravenous Given 01/05/22 1355)  lidocaine-EPINEPHrine-tetracaine (LET) topical gel (3 mLs Topical Given 01/05/22 1356)  ondansetron (ZOFRAN) injection 4 mg (4 mg Intravenous Given 01/05/22 1356)  sodium chloride 0.9 % bolus 1,000 mL (1,000 mLs Intravenous New  Bag/Given 01/05/22 1357)  doxycycline (VIBRA-TABS) tablet 100 mg (100 mg Oral Given 01/05/22 1356)    ED Course/ Medical Decision Making/ A&P                           Medical Decision Making Amount and/or Complexity of Data Reviewed Labs: ordered.  Risk Prescription drug management.   Patient is here with labial swelling, concerning for Bartholin abscess versus cyst.  Patient was consented for I&D.  This was performed with topical and IV medications for pain.   There is an  attempted bedside ID, agreeable to get a mild amount of purulent drainage, moderate amount of blood.  Patient per my review of external records also is a history of iron deficiency anemia and electrolyte derangements, and with her reported lightheadedness we did check these labs.  These were notable for a leukocytosis, no other significant derangements or abnormalities.  Pain improved on reassessment after IV medications.  Her vitals and symptoms reported nausea and lightheadedness all improved after pain medications, and I suspect are likely a pain response.  I would have a lower suspicion for sepsis at this time.  This would be an unlikely source of sepsis.  I will however initiate her on antibiotics, return precautions were discussed, delineated on her discharge papers.  Patient's mother and father were present for the majority of my history, exam, discharge instructions.         Final Clinical Impression(s) / ED Diagnoses Final diagnoses:  Bartholin's gland abscess    Rx / DC Orders ED Discharge Orders          Ordered    oxyCODONE (ROXICODONE) 5 MG immediate release tablet  Every 6 hours PRN        01/05/22 1509    ibuprofen (ADVIL) 600 MG tablet  Every 6 hours PRN        01/05/22 1509    doxycycline (VIBRAMYCIN) 100 MG capsule  2 times daily        01/05/22 1509              Wyvonnia Dusky, MD 01/05/22 [phone]

## 2022-02-20 ENCOUNTER — Other Ambulatory Visit: Payer: Self-pay | Admitting: Gastroenterology

## 2022-02-20 ENCOUNTER — Other Ambulatory Visit (HOSPITAL_COMMUNITY): Payer: Self-pay | Admitting: Gastroenterology

## 2022-02-20 DIAGNOSIS — K5000 Crohn's disease of small intestine without complications: Secondary | ICD-10-CM

## 2022-02-22 ENCOUNTER — Other Ambulatory Visit: Payer: Self-pay | Admitting: Gastroenterology

## 2022-02-22 DIAGNOSIS — K50019 Crohn's disease of small intestine with unspecified complications: Secondary | ICD-10-CM

## 2022-02-25 ENCOUNTER — Encounter (HOSPITAL_COMMUNITY): Payer: Self-pay

## 2022-02-25 ENCOUNTER — Ambulatory Visit (HOSPITAL_COMMUNITY): Payer: 59

## 2022-03-05 ENCOUNTER — Other Ambulatory Visit: Payer: Self-pay

## 2022-03-06 ENCOUNTER — Ambulatory Visit
Admission: RE | Admit: 2022-03-06 | Discharge: 2022-03-06 | Disposition: A | Discharge status: Home / Self Care | Payer: 59 | Source: Ambulatory Visit | Attending: Gastroenterology | Admitting: Gastroenterology

## 2022-03-06 ENCOUNTER — Inpatient Hospital Stay: Admission: RE | Admit: 2022-03-06 | Payer: 59 | Source: Ambulatory Visit

## 2022-03-06 DIAGNOSIS — K50019 Crohn's disease of small intestine with unspecified complications: Secondary | ICD-10-CM

## 2022-03-06 MED ORDER — IOPAMIDOL (ISOVUE-300) INJECTION 61%
100.0000 mL | Freq: Once | INTRAVENOUS | Status: AC | PRN
  Administered 2022-03-06: 100 mL via INTRAVENOUS

## 2022-03-08 ENCOUNTER — Other Ambulatory Visit: Payer: 59

## 2022-10-20 ENCOUNTER — Ambulatory Visit
Admission: EM | Admit: 2022-10-20 | Discharge: 2022-10-20 | Disposition: A | Discharge status: Home / Self Care | Payer: Medicaid Other | Attending: Internal Medicine | Admitting: Internal Medicine

## 2022-10-20 ENCOUNTER — Encounter: Payer: Self-pay | Admitting: Emergency Medicine

## 2022-10-20 DIAGNOSIS — M25511 Pain in right shoulder: Secondary | ICD-10-CM

## 2022-10-20 MED ORDER — PREDNISONE 20 MG PO TABS: 40.0000 mg | ORAL_TABLET | Freq: Every day | ORAL | 0 refills | Status: AC

## 2022-10-20 NOTE — ED Triage Notes (Signed)
Pt presents with right shoulder pain x 3 days. States the pain goes from the right shoulder into the right elbow, describes as an intermittent throbbing or sharp pain. Movement increases the pain. Has tried taking Tylenol Arthritis with some relief.  Denies any known injuries to the shoulder.

## 2022-10-20 NOTE — ED Provider Notes (Signed)
[facility]    CSN: [id number] Arrival date & time: 10/20/22  1114      History   Chief Complaint Chief Complaint  Patient presents with   Shoulder Pain    HPI Sharon Clayton is a 32 y.o. female with a history of Crohn's disease comes to urgent care with 3-day history of throbbing right shoulder pain.  Patient denies any heavy lifting, falls or trauma to the right shoulder.  Pain is throbbing and is of mild to moderate severity.  Pain is aggravated by movement and improves with rest.  Pain does not radiate to the hands.  No bruising over the right shoulder.  No neck pain or right shoulder stiffness.   HPI  Past Medical History:  Diagnosis Date   Bacterial infection 09/10/11   Crohn's disease (HCC)    H/O varicella 09/10/11   Hypotension    IBS (irritable bowel syndrome)    Low iron 09/10/11   Trichomonas 09/10/10   Yeast infection 09/10/11    Patient Active Problem List   Diagnosis Date Noted   Trichomonal vaginitis during pregnancy 03/04/2021   Vaginal discharge 12/25/2020   Bartholin cyst 07/16/2018   Crohn's disease with complication (HCC) 12/27/2015   S/P cesarean section 09/04/2013   Smoker 06/08/2013   IBS (irritable bowel syndrome) 06/08/2013   Uses marijuana 04/13/2013    Past Surgical History:  Procedure Laterality Date   CESAREAN SECTION  09/10/11   NRFHT - primary low transverse cesarean section    CESAREAN SECTION N/A 09/04/2013   Procedure: CESAREAN SECTION- repeat;  Surgeon: Tereso Newcomer, MD;  Location: [facility];  Service: Obstetrics;  Laterality: N/A;    OB History     Gravida  5   Para  2   Term  2   Preterm  0   AB  2   Living  2      SAB  1   IAB  1   Ectopic  0   Multiple  0   Live Births  2            Home Medications    Prior to Admission medications   Medication Sig Start Date End Date Taking? Authorizing Provider  predniSONE (DELTASONE) 20 MG tablet Take 2 tablets (40 mg total) by mouth daily for 5  days. 10/20/22 10/25/22 Yes Any Mcneice, Britta Mccreedy, MD  ferrous sulfate 325 (65 FE) MG tablet Take 325 mg by mouth every morning. 09/21/21   [provider]  ibuprofen (ADVIL) 600 MG tablet Take 1 tablet (600 mg total) by mouth every 6 (six) hours as needed for up to 30 doses for mild pain or moderate pain. 01/05/22   Terald Sleeper, MD  Multiple Vitamin (MULTIVITAMIN ADULT PO) Take 1 tablet by mouth daily.    [provider]  omeprazole (PRILOSEC OTC) 20 MG tablet Take 20 mg by mouth daily as needed (acid reflux).    [provider]  oxyCODONE (ROXICODONE) 5 MG immediate release tablet Take 1 tablet (5 mg total) by mouth every 6 (six) hours as needed for up to 12 doses for severe pain. 01/05/22   Terald Sleeper, MD  oxyCODONE-acetaminophen (PERCOCET/ROXICET) 5-325 MG tablet Take 1 tablet by mouth every 8 (eight) hours as needed for up to 12 doses for severe pain. Patient not taking: Reported on 11/10/2021 05/03/21   Terald Sleeper, MD  potassium chloride (KLOR-CON) 10 MEQ tablet Take 1 tablet (10 mEq total) by mouth daily  for 30 doses. 05/03/21 06/02/21  Terald Sleeper, MD  Prenatal Vit-Fe Fumarate-FA (PRENATAL PO) Take 1 tablet by mouth daily.    [provider]    Family History Family History  Problem Relation Age of Onset   Hypertension Mother    Hypertension Father    Colon cancer Neg Hx     Social History Social History   Tobacco Use   Smoking status: Every Day    Types: E-cigarettes   Smokeless tobacco: Never   Tobacco comments:    e-cig  Vaping Use   Vaping Use: Never used  Substance Use Topics   Alcohol use: Not Currently    Comment: occ   Drug use: Not Currently    Types: Marijuana     Allergies   Lactose intolerance (gi) and Flagyl [metronidazole]   Review of Systems Review of Systems As per HPI  Physical Exam Triage Vital Signs ED Triage Vitals  Enc Vitals Group     BP 10/20/22 1130 118/74     Pulse Rate 10/20/22  1130 84     Resp 10/20/22 1130 16     Temp 10/20/22 1130 98.6 F (37 C)     Temp Source 10/20/22 1130 Oral     SpO2 10/20/22 1130 96 %     Weight --      Height --      Head Circumference --      Peak Flow --      Pain Score 10/20/22 1131 8     Pain Loc --      Pain Edu? --      Excl. in GC? --    No data found.  Updated Vital Signs BP 118/74 (BP Location: Left Arm)   Pulse 84   Temp 98.6 F (37 C) (Oral)   Resp 16   SpO2 96%   Visual Acuity Right Eye Distance:   Left Eye Distance:   Bilateral Distance:    Right Eye Near:   Left Eye Near:    Bilateral Near:     Physical Exam Vitals and nursing note reviewed.  Constitutional:      General: She is not in acute distress.    Appearance: Normal appearance. She is not ill-appearing.  Cardiovascular:     Rate and Rhythm: Normal rate and regular rhythm.  Musculoskeletal:     Comments: Full range of motion of the right shoulder with pain.  No bruising over the right shoulder.  No shoulder deformity.  Neurological:     Mental Status: She is alert.      UC Treatments / Results  Labs (all labs ordered are listed, but only abnormal results are displayed) Labs Reviewed - No data to display  EKG   Radiology No results found.  Procedures Procedures (including critical care time)  Medications Ordered in UC Medications - No data to display  Initial Impression / Assessment and Plan / UC Course  I have reviewed the triage vital signs and the nursing notes.  Pertinent labs & imaging results that were available during my care of the patient were reviewed by me and considered in my medical decision making (see chart for details).     1.  Right shoulder pain: No indication for imaging studies at this time Right shoulder pain may be related to joint inflammation Patient cannot take ibuprofen because of history of Crohn's disease Short course of prednisone Return precautions given RICE protocol. Final Clinical  Impressions(s) / UC Diagnoses  Final diagnoses:  Acute pain of right shoulder     Discharge Instructions      Rest and elevate the affected painful area.   Apply cold compresses intermittently as needed.   As pain recedes, begin normal activities slowly as tolerated.   These return to urgent care if you have worsening symptoms There is no indication for x-rays at this time.   ED Prescriptions     Medication Sig Dispense Auth. Provider   predniSONE (DELTASONE) 20 MG tablet Take 2 tablets (40 mg total) by mouth daily for 5 days. 10 tablet Daquane Aguilar, Britta Mccreedy, MD      PDMP not reviewed this encounter.   Merrilee Jansky, MD 10/20/22 [phone]

## 2022-10-20 NOTE — Discharge Instructions (Addendum)
Rest and elevate the affected painful area.   Apply cold compresses intermittently as needed.   As pain recedes, begin normal activities slowly as tolerated.   These return to urgent care if you have worsening symptoms There is no indication for x-rays at this time.

## 2023-07-16 ENCOUNTER — Other Ambulatory Visit: Payer: Self-pay | Admitting: Gastroenterology

## 2023-07-16 DIAGNOSIS — K5090 Crohn's disease, unspecified, without complications: Secondary | ICD-10-CM

## 2023-07-28 ENCOUNTER — Ambulatory Visit
Admission: EM | Admit: 2023-07-28 | Discharge: 2023-07-28 | Disposition: A | Discharge status: Home / Self Care | Payer: Medicaid Other

## 2023-07-28 ENCOUNTER — Encounter: Payer: Self-pay | Admitting: Emergency Medicine

## 2023-07-28 DIAGNOSIS — K0889 Other specified disorders of teeth and supporting structures: Secondary | ICD-10-CM

## 2023-07-28 MED ORDER — ACETAMINOPHEN-CODEINE 300-30 MG PO TABS: 1.0000 | ORAL_TABLET | Freq: Three times a day (TID) | ORAL | 0 refills | Status: DC | PRN

## 2023-07-28 MED ORDER — LIDOCAINE VISCOUS HCL 2 % MT SOLN: 15.0000 mL | Freq: Four times a day (QID) | OROMUCOSAL | 0 refills | Status: DC | PRN

## 2023-07-28 MED ORDER — AMOXICILLIN-POT CLAVULANATE 875-125 MG PO TABS: 1.0000 | ORAL_TABLET | Freq: Two times a day (BID) | ORAL | 0 refills | Status: DC

## 2023-07-28 NOTE — ED Triage Notes (Addendum)
Pt reports L-sided tooth and ear pain x3-4 weeks. Reports the pain is on both the bottom and top L teeth. No facial swelling noted at any point. Sensitive to cold air as well as hot & cold beverages. L ear pain has been commitant with tooth pain. Pt is trying to get in with a dentist currently. Pt reports prior to the pain starting she started a new medication for Crohns - entyvio.

## 2023-07-28 NOTE — ED Provider Notes (Signed)
[facility]    CSN: [id number] Arrival date & time: 07/28/23  0805      History   Chief Complaint Chief Complaint  Patient presents with   Dental Pain   Otalgia    HPI Sharon Clayton is a 33 y.o. female.   Patient presents for evaluation of left upper and lower dental pain for 3 to 4 weeks.  Associated ear sensitivity and exacerbated by movement of the mouth.  Over the last 24 hours symptoms worsening, radiating into the left ear and into the throat.  Denies facial swelling, fever or drainage.  Attempting to get dental appointment.  Has not attempted treatment.  Recently started Entyvio infusions in January, did notify Dr. Rennis Petty symptom, does not believe related.   Past Medical History:  Diagnosis Date   Bacterial infection 09/10/11   Crohn's disease (HCC)    H/O varicella 09/10/11   Hypotension    IBS (irritable bowel syndrome)    Low iron 09/10/11   Trichomonas 09/10/10   Yeast infection 09/10/11    Patient Active Problem List   Diagnosis Date Noted   Trichomonal vaginitis during pregnancy 03/04/2021   Vaginal discharge 12/25/2020   Bartholin cyst 07/16/2018   Crohn's disease with complication (HCC) 12/27/2015   S/P cesarean section 09/04/2013   Smoker 06/08/2013   IBS (irritable bowel syndrome) 06/08/2013   Uses marijuana 04/13/2013    Past Surgical History:  Procedure Laterality Date   CESAREAN SECTION  09/10/11   NRFHT - primary low transverse cesarean section    CESAREAN SECTION N/A 09/04/2013   Procedure: CESAREAN SECTION- repeat;  Surgeon: Tereso Newcomer, MD;  Location: [facility];  Service: Obstetrics;  Laterality: N/A;    OB History     Gravida  5   Para  2   Term  2   Preterm  0   AB  2   Living  2      SAB  1   IAB  1   Ectopic  0   Multiple  0   Live Births  2            Home Medications    Prior to Admission medications   Medication Sig Start Date End Date Taking? Authorizing Provider  ADRENALIN 1 MG/ML SOLN  06/05/23    [provider]  QUEtiapine (SEROQUEL) 25 MG tablet Take 25 mg by mouth at bedtime.   Yes [provider]  SOLU-MEDROL, PF, 125 MG injection ACT-O-VIAL SMARTSIG:1 IM Daily Patient not taking: Reported on 07/28/2023 06/05/23   [provider]  budesonide (ENTOCORT EC) 3 MG 24 hr capsule TAKE 3 CAPSULES EVERY DAY BY ORAL ROUTE FOR 60 DAYS. Patient not taking: Reported on 07/28/2023    [provider]  diclofenac Sodium (VOLTAREN) 1 % GEL APPLY 2 GRAMS TO THE AFFECTED AREA(S) BY TOPICAL ROUTE 4 TIMES PER DAY Patient not taking: Reported on 07/28/2023    [provider]  dicyclomine (BENTYL) 20 MG tablet Take 1 tablet 4 times a day by oral route as needed for 30 days. Patient not taking: Reported on 07/28/2023    [provider]  ferrous sulfate 325 (65 FE) MG tablet Take 325 mg by mouth every morning. Patient not taking: Reported on 07/28/2023 09/21/21   [provider]  fluconazole (DIFLUCAN) 150 MG tablet TAKE 1 TABLET ONCE FOR 1 DOSE. CAN TAKE ADDITIONAL DOSE THREE DAYS LATER IF SYMPTOMS PERSIST Patient not taking: Reported on 07/28/2023  [provider]  fluticasone (FLONASE) 50 MCG/ACT nasal spray SPRAY 1 SPRAY TWICE A DAY BY INTRANASAL ROUTE.    [provider]  ibuprofen (ADVIL) 600 MG tablet Take 1 tablet (600 mg total) by mouth every 6 (six) hours as needed for up to 30 doses for mild pain or moderate pain. 01/05/22  Yes Trifan, Kermit Balo, MD  loperamide (IMODIUM) 2 MG capsule Take one capsule by mouth as needed following loose stool not to exceed 8 capsules in 24 hours    [provider]  Loteprednol Etabonate (EYSUVIS) 0.25 % SUSP     [provider]  Multiple Vitamin (MULTIVITAMIN ADULT PO) Take 1 tablet by mouth daily.   Yes [provider]  omeprazole (PRILOSEC) 40 MG capsule take 1 tab po every am on empty stomach 30 mins before meal   Yes [provider]  oxyCODONE  (ROXICODONE) 5 MG immediate release tablet Take 1 tablet (5 mg total) by mouth every 6 (six) hours as needed for up to 12 doses for severe pain. Patient not taking: Reported on 07/28/2023 01/05/22   Terald Sleeper, MD  oxyCODONE-acetaminophen (PERCOCET/ROXICET) 5-325 MG tablet Take 1 tablet by mouth every 8 (eight) hours as needed for up to 12 doses for severe pain. Patient not taking: Reported on 11/10/2021 05/03/21   Terald Sleeper, MD  potassium chloride (KLOR-CON) 10 MEQ tablet Take 1 tablet (10 mEq total) by mouth daily for 30 doses. 05/03/21 06/02/21  Terald Sleeper, MD  Prenatal Vit-Fe Fumarate-FA (PRENATAL PO) Take 1 tablet by mouth daily.   Yes [provider]    Family History Family History  Problem Relation Age of Onset   Hypertension Mother    Hypertension Father    Colon cancer Neg Hx     Social History Social History   Tobacco Use   Smoking status: Every Day    Types: E-cigarettes   Smokeless tobacco: Never   Tobacco comments:    e-cig  Vaping Use   Vaping status: Never Used  Substance Use Topics   Alcohol use: Not Currently    Comment: occ   Drug use: Not Currently    Types: Marijuana     Allergies   Metronidazole and Lactose intolerance (gi)   Review of Systems Review of Systems  HENT:  Negative for ear pain.      Physical Exam Triage Vital Signs ED Triage Vitals  Encounter Vitals Group     BP 07/28/23 0846 114/79     Systolic BP Percentile --      Diastolic BP Percentile --      Pulse Rate 07/28/23 0846 80     Resp 07/28/23 0846 16     Temp 07/28/23 0846 98.6 F (37 C)     Temp Source 07/28/23 0846 Oral     SpO2 07/28/23 0846 97 %     Weight --      Height --      Head Circumference --      Peak Flow --      Pain Score 07/28/23 0847 2     Pain Loc --      Pain Education --      Exclude from Growth Chart --    No data found.  Updated Vital Signs BP 114/79 (BP Location: Left Arm)   Pulse 80   Temp 98.6 F (37 C)  (Oral)   Resp 16   LMP 07/05/2023 (Exact Date)   SpO2 97%  Visual Acuity Right Eye Distance:   Left Eye Distance:   Bilateral Distance:    Right Eye Near:   Left Eye Near:    Bilateral Near:     Physical Exam Constitutional:      Appearance: Normal appearance.  HENT:     Right Ear: Tympanic membrane, ear canal and external ear normal.     Left Ear: Tympanic membrane, ear canal and external ear normal.     Mouth/Throat:     Comments: No overt abnormality to the upper or lower gumline or tooth Eyes:     Extraocular Movements: Extraocular movements intact.  Pulmonary:     Effort: Pulmonary effort is normal.  Musculoskeletal:     Cervical back: Normal range of motion and neck supple.  Neurological:     Mental Status: She is alert and oriented to person, place, and time. Mental status is at baseline.      UC Treatments / Results  Labs (all labs ordered are listed, but only abnormal results are displayed) Labs Reviewed - No data to display  EKG   Radiology No results found.  Procedures Procedures (including critical care time)  Medications Ordered in UC Medications - No data to display  Initial Impression / Assessment and Plan / UC Course  I have reviewed the triage vital signs and the nursing notes.  Pertinent labs & imaging results that were available during my care of the patient were reviewed by me and considered in my medical decision making (see chart for details).  Dental pain  Vital signs stable no overt abnormality noted to the oropharynx, no abnormality noted to the ear, discussed this with patient, prophylactically providing coverage for bacteria due to sensitivity to the teeth and gums, prescribed Augmentin, additionally prescribed lidocaine viscus and Tylenol 3, PDMP reviewed, low risk, discussed dental resources within the area and advised follow-up as needed Final Clinical Impressions(s) / UC Diagnoses   Final diagnoses:  None   Discharge  Instructions   None    ED Prescriptions   None    PDMP not reviewed this encounter.   Valinda Hoar, [place] 07/28/23 [phone]

## 2023-07-28 NOTE — Discharge Instructions (Addendum)
Today you are evaluated for dental pain  Will provide coverage for infection, begin Augmentin every morning and every evening for 7 days  You may gargle and spit lidocaine solution every 4-6 hours as needed to provide temporary relief to your mouth  You may take Tylenol 3 every 6 hours as needed for severe pain, please be mindful this can make you feel sleepy  Please continue to reach out and attempt to find dentist for further evaluation and management

## 2023-08-19 ENCOUNTER — Ambulatory Visit
Admission: RE | Admit: 2023-08-19 | Discharge: 2023-08-19 | Disposition: A | Discharge status: Home / Self Care | Payer: Medicaid Other | Source: Ambulatory Visit | Attending: Gastroenterology | Admitting: Gastroenterology

## 2023-08-19 DIAGNOSIS — K5090 Crohn's disease, unspecified, without complications: Secondary | ICD-10-CM

## 2023-08-19 MED ORDER — GADOPICLENOL 0.5 MMOL/ML IV SOLN
9.0000 mL | Freq: Once | INTRAVENOUS | Status: AC | PRN
  Administered 2023-08-19: 9 mL via INTRAVENOUS

## 2023-11-16 ENCOUNTER — Ambulatory Visit

## 2023-11-16 ENCOUNTER — Ambulatory Visit: Admission: EM | Admit: 2023-11-16 | Discharge: 2023-11-16 | Disposition: A | Discharge status: Home / Self Care

## 2023-11-16 DIAGNOSIS — M533 Sacrococcygeal disorders, not elsewhere classified: Secondary | ICD-10-CM

## 2023-11-16 DIAGNOSIS — W19XXXA Unspecified fall, initial encounter: Secondary | ICD-10-CM

## 2023-11-16 DIAGNOSIS — M545 Low back pain, unspecified: Secondary | ICD-10-CM

## 2023-11-16 DIAGNOSIS — S99911A Unspecified injury of right ankle, initial encounter: Secondary | ICD-10-CM

## 2023-11-16 MED ORDER — TIZANIDINE HCL 4 MG PO TABS: 4.0000 mg | ORAL_TABLET | Freq: Four times a day (QID) | ORAL | 0 refills | Status: DC | PRN

## 2023-11-16 MED ORDER — PREDNISONE 20 MG PO TABS
40.0000 mg | ORAL_TABLET | Freq: Every day | ORAL | 0 refills | Status: AC
Start: 2023-11-16 — End: 2023-11-21

## 2023-11-16 NOTE — ED Triage Notes (Signed)
"  Last night I went to go get my step daughter at her work, was having a flare up with my crohn's so when coming out of a restroom, I fell on a curb hitting my left lower/mid back with twisting my right ankle around 1015 pm".

## 2023-11-16 NOTE — ED Provider Notes (Signed)
 [facility]    CSN: [id number] Arrival date & time: 11/16/23  1026      History   Chief Complaint Chief Complaint  Patient presents with   Fall    HPI Sharon Clayton is a 33 y.o. female.   Presents today for evaluation of pain to her left lower back and buttocks area after she fell hitting a curb last night.  She states that she has also had some lower back pain.  She denies any numbness or tingling.  She has not had any loss of bowel or bladder function.  She notes that during the fall she also twisted her ankle on the right side.  This has been less painful today.  She has used ice and this for improvement.  The history is provided by the patient.  Fall Pertinent negatives include no abdominal pain and no shortness of breath.    Past Medical History:  Diagnosis Date   Bacterial infection 09/10/11   Crohn's disease (HCC)    H/O varicella 09/10/11   Hypotension    IBS (irritable bowel syndrome)    Low iron 09/10/11   Trichomonas 09/10/10   Yeast infection 09/10/11    Patient Active Problem List   Diagnosis Date Noted   Trichomonal vaginitis during pregnancy 03/04/2021   Vaginal discharge 12/25/2020   Atypical squamous cells of undetermined significance (ASCUS) on Papanicolaou smear of cervix 11/02/2020   Iron deficiency anemia 11/02/2020   Mixed anxiety and depressive disorder 09/30/2019   Anemia 09/02/2019   Vitamin D deficiency 09/02/2019   Bartholin cyst 07/16/2018   Crohn's disease (HCC) 12/27/2015   S/P cesarean section 09/04/2013   Smoker 06/08/2013   Irritable bowel syndrome with diarrhea 06/08/2013   Uses marijuana 04/13/2013    Past Surgical History:  Procedure Laterality Date   CESAREAN SECTION  09/10/11   NRFHT - primary low transverse cesarean section    CESAREAN SECTION N/A 09/04/2013   Procedure: CESAREAN SECTION- repeat;  Surgeon: Julianne Octave, MD;  Location: [facility];  Service: Obstetrics;  Laterality: N/A;    OB History     Gravida   5   Para  2   Term  2   Preterm  0   AB  2   Living  2      SAB  1   IAB  1   Ectopic  0   Multiple  0   Live Births  2            Home Medications    Prior to Admission medications   Medication Sig Start Date End Date Taking? Authorizing Provider  amoxicillin  (AMOXIL ) 500 MG capsule Take 500 mg by mouth every 8 (eight) hours. 08/07/23  Yes [provider]  chlorhexidine (PERIDEX) 0.12 % solution Use as directed 5 mLs in the mouth or throat 2 (two) times daily. 08/07/23  Yes [provider]  ENTYVIO 300 MG injection Inject into the vein. 07/23/23  Yes [provider]  pantoprazole  (PROTONIX ) 40 MG tablet Take 40 mg by mouth daily. 08/13/23  Yes [provider]  predniSONE  (DELTASONE ) 20 MG tablet Take 2 tablets (40 mg total) by mouth daily with breakfast for 5 days. 11/16/23 11/21/23 Yes Vernestine Gondola, PA-C  Prenatal Vit-Fe Fumarate-FA (PRENATAL PO) Take 1 tablet by mouth daily.   Yes [provider]  QUEtiapine (SEROQUEL) 25 MG tablet Take 25 mg by mouth at bedtime.   Yes [provider]  tinidazole [facility])  500 MG tablet Take 500 mg by mouth as directed. 05/09/21  Yes [provider]  tiZANidine (ZANAFLEX) 4 MG tablet Take 1 tablet (4 mg total) by mouth every 6 (six) hours as needed for muscle spasms. 11/16/23  Yes Vernestine Gondola, PA-C  acetaminophen -codeine  (TYLENOL  #3) 300-30 MG tablet Take 1 tablet by mouth every 8 (eight) hours as needed for severe pain (pain score 7-10). 07/28/23   Buena Carmine, NP  amoxicillin -clavulanate (AUGMENTIN ) 875-125 MG tablet Take 1 tablet by mouth every 12 (twelve) hours. 07/28/23   White, Adrienne R, NP  budesonide (ENTOCORT EC) 3 MG 24 hr capsule TAKE 3 CAPSULES EVERY DAY BY ORAL ROUTE FOR 60 DAYS. Patient not taking: Reported on 07/28/2023    [provider]  diclofenac Sodium (VOLTAREN) 1 % GEL APPLY 2 GRAMS TO THE AFFECTED AREA(S) BY TOPICAL ROUTE 4 TIMES PER  DAY Patient not taking: Reported on 07/28/2023    [provider]  dicyclomine  (BENTYL ) 20 MG tablet Take 1 tablet 4 times a day by oral route as needed for 30 days. Patient not taking: Reported on 07/28/2023    [provider]  ferrous sulfate 325 (65 FE) MG tablet Take 325 mg by mouth every morning. Patient not taking: Reported on 07/28/2023 09/21/21   [provider]  fluconazole  (DIFLUCAN ) 150 MG tablet TAKE 1 TABLET ONCE FOR 1 DOSE. CAN TAKE ADDITIONAL DOSE THREE DAYS LATER IF SYMPTOMS PERSIST Patient not taking: Reported on 07/28/2023    [provider]  fluticasone  (FLONASE ) 50 MCG/ACT nasal spray SPRAY 1 SPRAY TWICE A DAY BY INTRANASAL ROUTE.    [provider]  ibuprofen  (ADVIL ) 600 MG tablet Take 1 tablet (600 mg total) by mouth every 6 (six) hours as needed for up to 30 doses for mild pain or moderate pain. 01/05/22   Arvilla Birmingham, MD  lidocaine  (XYLOCAINE ) 2 % solution Use as directed 15 mLs in the mouth or throat every 6 (six) hours as needed for mouth pain. 07/28/23   Reena Canning, NP  loperamide  (IMODIUM ) 2 MG capsule Take one capsule by mouth as needed following loose stool not to exceed 8 capsules in 24 hours    [provider]  Loteprednol Etabonate (EYSUVIS) 0.25 % SUSP     [provider]  metroNIDAZOLE  (FLAGYL ) 500 MG tablet Take 500 mg by mouth 2 (two) times daily.    [provider]  Multiple Vitamin (MULTIVITAMIN ADULT PO) Take 1 tablet by mouth daily.    [provider]  omeprazole  (PRILOSEC) 40 MG capsule take 1 tab po every am on empty stomach 30 mins before meal    [provider]  oxyCODONE -acetaminophen  (PERCOCET/ROXICET) 5-325 MG tablet Take 1 tablet by mouth every 8 (eight) hours as needed for up to 12 doses for severe pain. Patient not taking: Reported on 11/10/2021 05/03/21   Arvilla Birmingham, MD  potassium chloride  (KLOR-CON ) 10 MEQ tablet Take 1 tablet (10 mEq total) by  mouth daily for 30 doses. 05/03/21 06/02/21  Arvilla Birmingham, MD  potassium chloride  (KLOR-CON ) 10 MEQ tablet TAKE 1 TABLET (10 MEQ TOTAL) BY MOUTH DAILY FOR 30 DOSES.    [provider]  traMADol  (ULTRAM ) 50 MG tablet Take 1 tablet by mouth every 6 (six) hours.    [provider]  ustekinumab Sherley Distad) 130 MG/26ML SOLN injection     [provider]  ustekinumab (STELARA) 45 MG/0.5ML injection     [provider]  Family History Family History  Problem Relation Age of Onset   Hypertension Mother    Hypertension Father    Colon cancer Neg Hx     Social History Social History   Tobacco Use   Smoking status: Never   Smokeless tobacco: Never   Tobacco comments:    e-cig  Vaping Use   Vaping status: Every Day   Substances: Nicotine, Flavoring  Substance Use Topics   Alcohol  use: Yes    Comment: occ   Drug use: Not Currently    Types: Marijuana    Comment: Years ago.     Allergies   Metronidazole  and Lactose intolerance (gi)   Review of Systems Review of Systems  Constitutional:  Negative for chills and fever.  Eyes:  Negative for discharge and redness.  Respiratory:  Negative for shortness of breath.   Gastrointestinal:  Negative for abdominal pain, nausea and vomiting.  Musculoskeletal:  Positive for arthralgias, back pain and myalgias. Negative for joint swelling.  Neurological:  Negative for weakness and numbness.     Physical Exam Triage Vital Signs ED Triage Vitals  Encounter Vitals Group     BP 11/16/23 1048 107/72     Systolic BP Percentile --      Diastolic BP Percentile --      Pulse Rate 11/16/23 1048 80     Resp 11/16/23 1048 18     Temp 11/16/23 1048 98.8 F (37.1 C)     Temp Source 11/16/23 1048 Oral     SpO2 11/16/23 1048 97 %     Weight 11/16/23 1044 195 lb (88.5 kg)     Height 11/16/23 1044 5\' 2"  (1.575 m)     Head Circumference --      Peak Flow --      Pain Score 11/16/23 1041 9     Pain Loc --       Pain Education --      Exclude from Growth Chart --    No data found.  Updated Vital Signs BP 107/72 (BP Location: Left Arm)   Pulse 80   Temp 98.8 F (37.1 C) (Oral)   Resp 18   Ht 5\' 2"  (1.575 m)   Wt 195 lb (88.5 kg)   LMP 10/23/2023 (Exact Date)   SpO2 97%   BMI 35.67 kg/m   Visual Acuity Right Eye Distance:   Left Eye Distance:   Bilateral Distance:    Right Eye Near:   Left Eye Near:    Bilateral Near:     Physical Exam Vitals and nursing note reviewed.  Constitutional:      General: She is not in acute distress.    Appearance: Normal appearance. She is not ill-appearing.  HENT:     Head: Normocephalic and atraumatic.  Eyes:     Conjunctiva/sclera: Conjunctivae normal.  Cardiovascular:     Rate and Rhythm: Normal rate.  Pulmonary:     Effort: Pulmonary effort is normal. No respiratory distress.  Musculoskeletal:     Comments: Tenderness to palpation noted to midline lumbar spine.  Full range of motion of right ankle without swelling appreciated.  Neurological:     Mental Status: She is alert.     Comments: Gross sensation intact to right toes  Psychiatric:        Mood and Affect: Mood normal.        Behavior: Behavior normal.        Thought Content: Thought content normal.  UC Treatments / Results  Labs (all labs ordered are listed, but only abnormal results are displayed) Labs Reviewed - No data to display  EKG   Radiology DG Lumbar Spine Complete Result Date: 11/16/2023 CLINICAL DATA:  Low back and sacral pain following injury. EXAM: LUMBAR SPINE - COMPLETE 4+ VIEW; SACRUM AND COCCYX - 2+ VIEW COMPARISON:  Abdominopelvic CT 03/06/2022. Radiographs of the lumbar spine and sacrum 06/27/2014. FINDINGS: Lumbar spine: There are 5 lumbar type vertebral bodies. 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 The alignment is otherwise normal. There is no evidence of acute fracture or aggressive osseous  lesion. The lumbar disc spaces are preserved. Sacrum and coccyx: No evidence of acute fracture. The sacroiliac joints are intact without erosive changes. Grossly stable pelvic calcifications, likely phleboliths. IMPRESSION: 1. No evidence of acute fracture or traumatic malalignment in the lumbar spine, sacrum or coccyx. 2. 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 Electronically Signed   By: Elmon Hagedorn M.D.   On: 11/16/2023 12:00   DG Sacrum/Coccyx Result Date: 11/16/2023 CLINICAL DATA:  Low back and sacral pain following injury. EXAM: LUMBAR SPINE - COMPLETE 4+ VIEW; SACRUM AND COCCYX - 2+ VIEW COMPARISON:  Abdominopelvic CT 03/06/2022. Radiographs of the lumbar spine and sacrum 06/27/2014. FINDINGS: Lumbar spine: There are 5 lumbar type vertebral bodies. 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 The alignment is otherwise normal. There is no evidence of acute fracture or aggressive osseous lesion. The lumbar disc spaces are preserved. Sacrum and coccyx: No evidence of acute fracture. The sacroiliac joints are intact without erosive changes. Grossly stable pelvic calcifications, likely phleboliths. IMPRESSION: 1. No evidence of acute fracture or traumatic malalignment in the lumbar spine, sacrum or coccyx. 2. 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 Electronically Signed   By: Elmon Hagedorn M.D.   On: 11/16/2023 12:00   DG Ankle Complete Right Result Date: 11/16/2023 CLINICAL DATA:  Trauma to the right ankle EXAM: RIGHT ANKLE - COMPLETE 3+ VIEW COMPARISON:  None Available. FINDINGS: No acute fracture or dislocation. The bones appear osteopenic. The ankle mortise is intact. The soft tissues unremarkable. IMPRESSION: No acute fracture or dislocation. Electronically Signed   By: Angus Bark M.D.   On: 11/16/2023 11:56    Procedures Procedures (including  critical care time)  Medications Ordered in UC Medications - No data to display  Initial Impression / Assessment and Plan / UC Course  I have reviewed the triage vital signs and the nursing notes.  Pertinent labs & imaging results that were available during my care of the patient were reviewed by me and considered in my medical decision making (see chart for details).    X-rays without fracture noted.  Discussed most likely bruising and soft tissue damage.  Advised steroid burst to hopefully help with inflammation and pain as well as muscle relaxer.  Discussed the muscle relaxer may cause drowsiness and use it with caution.  Encouraged follow-up if no gradual improvement or with any worsening symptoms.  Final Clinical Impressions(s) / UC Diagnoses   Final diagnoses:  Fall, initial encounter  Right ankle injury, initial encounter  Acute left-sided low back pain without sciatica  Tail bone pain   Discharge Instructions   None    ED Prescriptions     Medication Sig Dispense Auth. Provider   predniSONE  (DELTASONE ) 20 MG tablet Take 2  tablets (40 mg total) by mouth daily with breakfast for 5 days. 10 tablet Jami Mcclintock F, PA-C   tiZANidine (ZANAFLEX) 4 MG tablet Take 1 tablet (4 mg total) by mouth every 6 (six) hours as needed for muscle spasms. 30 tablet Vernestine Gondola, PA-C      PDMP not reviewed this encounter.   Vernestine Gondola, PA-C 11/16/23 1408

## 2024-01-07 ENCOUNTER — Ambulatory Visit
Admission: EM | Admit: 2024-01-07 | Discharge: 2024-01-07 | Disposition: A | Discharge status: Home / Self Care | Attending: Physician Assistant | Admitting: Physician Assistant

## 2024-01-07 ENCOUNTER — Encounter: Payer: Self-pay | Admitting: Emergency Medicine

## 2024-01-07 DIAGNOSIS — R300 Dysuria: Secondary | ICD-10-CM | POA: Insufficient documentation

## 2024-01-07 DIAGNOSIS — Z3200 Encounter for pregnancy test, result unknown: Secondary | ICD-10-CM | POA: Insufficient documentation

## 2024-01-07 DIAGNOSIS — N3000 Acute cystitis without hematuria: Secondary | ICD-10-CM | POA: Insufficient documentation

## 2024-01-07 LAB — POCT URINE DIPSTICK
Bilirubin, UA: NEGATIVE
Blood, UA: NEGATIVE
Glucose, UA: NEGATIVE mg/dL
Ketones, POC UA: NEGATIVE mg/dL
Nitrite, UA: POSITIVE — AB
Spec Grav, UA: 1.025 (ref 1.010–1.025)
Urobilinogen, UA: 0.2 U/dL
pH, UA: 6.5 (ref 5.0–8.0)

## 2024-01-07 LAB — POCT URINE PREGNANCY: Preg Test, Ur: NEGATIVE

## 2024-01-07 MED ORDER — AMOXICILLIN-POT CLAVULANATE 875-125 MG PO TABS: 1.0000 | ORAL_TABLET | Freq: Two times a day (BID) | ORAL | 0 refills | Status: DC

## 2024-01-07 NOTE — ED Provider Notes (Signed)
 [facility]    CSN: [id number] Arrival date & time: 01/07/24  1710      History   Chief Complaint Chief Complaint  Patient presents with   Possible Pregnancy   Dysuria    HPI Sharon Clayton is a 33 y.o. female.   Patient here today for evaluation of possible pregnancy.  She reports that she has had breast discomfort, nausea and low back pain for 1 week.  She reports that she had a possibly positive early detection pregnancy test at home this morning.  She would like to do one here and blood test if necessary as she has awaiting treatment for Crohn's based on results.  She also reports she has had some dysuria for the last 2 weeks intermittently.  She has taken cranberry juice at home with mild relief of UTI symptoms.  The history is provided by the patient.    Past Medical History:  Diagnosis Date   Bacterial infection 09/10/11   Crohn's disease (HCC)    H/O varicella 09/10/11   Hypotension    IBS (irritable bowel syndrome)    Low iron 09/10/11   Trichomonas 09/10/10   Yeast infection 09/10/11    Patient Active Problem List   Diagnosis Date Noted   Trichomonal vaginitis during pregnancy 03/04/2021   Vaginal discharge 12/25/2020   Atypical squamous cells of undetermined significance (ASCUS) on Papanicolaou smear of cervix 11/02/2020   Iron deficiency anemia 11/02/2020   Mixed anxiety and depressive disorder 09/30/2019   Anemia 09/02/2019   Vitamin D deficiency 09/02/2019   Bartholin cyst 07/16/2018   Crohn's disease (HCC) 12/27/2015   S/P cesarean section 09/04/2013   Smoker 06/08/2013   Irritable bowel syndrome with diarrhea 06/08/2013   Uses marijuana 04/13/2013    Past Surgical History:  Procedure Laterality Date   CESAREAN SECTION  09/10/11   NRFHT - primary low transverse cesarean section    CESAREAN SECTION N/A 09/04/2013   Procedure: CESAREAN SECTION- repeat;  Surgeon: Gloris DELENA Hugger, MD;  Location: [facility];  Service: Obstetrics;  Laterality: N/A;     OB History     Gravida  5   Para  2   Term  2   Preterm  0   AB  2   Living  2      SAB  1   IAB  1   Ectopic  0   Multiple  0   Live Births  2            Home Medications    Prior to Admission medications   Medication Sig Start Date End Date Taking? Authorizing Provider  amoxicillin -clavulanate (AUGMENTIN ) 875-125 MG tablet Take 1 tablet by mouth every 12 (twelve) hours. 01/07/24  Yes Billy Asberry FALCON, PA-C  pantoprazole  (PROTONIX ) 40 MG tablet Take 40 mg by mouth daily. 08/13/23  Yes [provider]  Prenatal Vit-Fe Fumarate-FA (PRENATAL PO) Take 1 tablet by mouth daily.   Yes [provider]  QUEtiapine (SEROQUEL) 25 MG tablet Take 25 mg by mouth at bedtime.   Yes [provider]  tiZANidine  (ZANAFLEX ) 4 MG tablet Take 1 tablet (4 mg total) by mouth every 6 (six) hours as needed for muscle spasms. 11/16/23  Yes Billy Asberry FALCON, PA-C  Vitamin D, Ergocalciferol, (DRISDOL) 1.25 MG (50000 UNIT) CAPS capsule Take 50,000 Units by mouth once a week. 11/27/23  Yes [provider]  acetaminophen -codeine  (TYLENOL  #3) 300-30 MG tablet Take 1 tablet by mouth every 8 (eight)  hours as needed for severe pain (pain score 7-10). Patient not taking: Reported on 01/07/2024 07/28/23   Arloa Suzen RAMAN, NP  budesonide (ENTOCORT EC) 3 MG 24 hr capsule TAKE 3 CAPSULES EVERY DAY BY ORAL ROUTE FOR 60 DAYS. Patient not taking: Reported on 07/28/2023    [provider]  buPROPion (WELLBUTRIN SR) 150 MG 12 hr tablet Take by mouth. Patient not taking: Reported on 01/07/2024 10/25/23   [provider]  chlorhexidine (PERIDEX) 0.12 % solution Use as directed 5 mLs in the mouth or throat 2 (two) times daily. Patient not taking: Reported on 01/07/2024 08/07/23   [provider]  desvenlafaxine (PRISTIQ) 25 MG 24 hr tablet Take 25 mg by mouth daily. Patient not taking: Reported on 01/07/2024 08/30/23   [provider]  diclofenac  Sodium (VOLTAREN) 1 % GEL APPLY 2 GRAMS TO THE AFFECTED AREA(S) BY TOPICAL ROUTE 4 TIMES PER DAY Patient not taking: Reported on 07/28/2023    [provider]  dicyclomine  (BENTYL ) 20 MG tablet Take 1 tablet 4 times a day by oral route as needed for 30 days. Patient not taking: Reported on 07/28/2023    [provider]  ENTYVIO 300 MG injection Inject into the vein. 07/23/23   [provider]  ferrous sulfate 325 (65 FE) MG tablet Take 325 mg by mouth every morning. Patient not taking: Reported on 07/28/2023 09/21/21   [provider]  fluconazole  (DIFLUCAN ) 150 MG tablet TAKE 1 TABLET ONCE FOR 1 DOSE. CAN TAKE ADDITIONAL DOSE THREE DAYS LATER IF SYMPTOMS PERSIST Patient not taking: Reported on 07/28/2023    [provider]  fluticasone  (FLONASE ) 50 MCG/ACT nasal spray SPRAY 1 SPRAY TWICE A DAY BY INTRANASAL ROUTE.    [provider]  ibuprofen  (ADVIL ) 600 MG tablet Take 1 tablet (600 mg total) by mouth every 6 (six) hours as needed for up to 30 doses for mild pain or moderate pain. 01/05/22   Cottie Donnice PARAS, MD  lidocaine  (XYLOCAINE ) 2 % solution Use as directed 15 mLs in the mouth or throat every 6 (six) hours as needed for mouth pain. Patient not taking: Reported on 01/07/2024 07/28/23   Teresa Shelba SAUNDERS, NP  loperamide  (IMODIUM ) 2 MG capsule Take one capsule by mouth as needed following loose stool not to exceed 8 capsules in 24 hours    [provider]  Loteprednol Etabonate (EYSUVIS) 0.25 % SUSP     [provider]  Multiple Vitamin (MULTIVITAMIN ADULT PO) Take 1 tablet by mouth daily. Patient not taking: Reported on 01/07/2024    [provider]  omeprazole  (PRILOSEC) 40 MG capsule take 1 tab po every am on empty stomach 30 mins before meal Patient not taking: Reported on 01/07/2024    [provider]  oxyCODONE -acetaminophen  (PERCOCET/ROXICET) 5-325 MG tablet Take 1 tablet by mouth every 8 (eight) hours as  needed for up to 12 doses for severe pain. Patient not taking: Reported on 11/10/2021 05/03/21   Cottie Donnice PARAS, MD  phentermine (ADIPEX-P) 37.5 MG tablet Take 37.5 mg by mouth every morning. Patient not taking: Reported on 01/07/2024 11/19/23   [provider]  potassium chloride  (KLOR-CON ) 10 MEQ tablet Take 1 tablet (10 mEq total) by mouth daily for 30 doses. Patient not taking: Reported on 01/07/2024 05/03/21 06/02/21  Cottie Donnice PARAS, MD  potassium chloride  (KLOR-CON ) 10 MEQ tablet TAKE 1 TABLET (10 MEQ TOTAL) BY MOUTH DAILY FOR 30 DOSES. Patient not taking: Reported on 01/07/2024  [provider]  traMADol  (ULTRAM ) 50 MG tablet Take 1 tablet by mouth every 6 (six) hours. Patient not taking: Reported on 01/07/2024    [provider]  ustekinumab MARGARIE) 130 MG/26ML SOLN injection     [provider]  ustekinumab MARGARIE) 45 MG/0.5ML injection     [provider]    Family History Family History  Problem Relation Age of Onset   Hypertension Mother    Hypertension Father    Colon cancer Neg Hx     Social History Social History   Tobacco Use   Smoking status: Never   Smokeless tobacco: Never   Tobacco comments:    e-cig  Vaping Use   Vaping status: Every Day   Substances: Nicotine, Flavoring  Substance Use Topics   Alcohol  use: Not Currently    Comment: occ   Drug use: Not Currently    Types: Marijuana    Comment: Years ago.     Allergies   Metronidazole  and Lactose intolerance (gi)   Review of Systems Review of Systems  Constitutional:  Negative for chills and fever.  Eyes:  Negative for discharge and redness.  Respiratory:  Negative for shortness of breath.   Gastrointestinal:  Positive for nausea. Negative for abdominal pain and vomiting.  Musculoskeletal:  Positive for back pain.     Physical Exam Triage Vital Signs ED Triage Vitals  Encounter Vitals Group     BP      Girls Systolic BP Percentile       Girls Diastolic BP Percentile      Boys Systolic BP Percentile      Boys Diastolic BP Percentile      Pulse      Resp      Temp      Temp src      SpO2      Weight      Height      Head Circumference      Peak Flow      Pain Score      Pain Loc      Pain Education      Exclude from Growth Chart    No data found.  Updated Vital Signs BP 114/78 (BP Location: Left Arm)   Pulse 71   Temp 98.3 F (36.8 C) (Oral)   Resp 16   LMP 12/15/2023 (Exact Date)   SpO2 98%   Visual Acuity Right Eye Distance:   Left Eye Distance:   Bilateral Distance:    Right Eye Near:   Left Eye Near:    Bilateral Near:     Physical Exam Vitals and nursing note reviewed.  Constitutional:      General: She is not in acute distress.    Appearance: Normal appearance. She is not ill-appearing.  HENT:     Head: Normocephalic and atraumatic.  Eyes:     Conjunctiva/sclera: Conjunctivae normal.  Cardiovascular:     Rate and Rhythm: Normal rate.  Pulmonary:     Effort: Pulmonary effort is normal. No respiratory distress.  Neurological:     Mental Status: She is alert.  Psychiatric:        Mood and Affect: Mood normal.        Behavior: Behavior normal.        Thought Content: Thought content normal.      UC Treatments / Results  Labs (all labs ordered are listed, but only abnormal results are displayed) Labs Reviewed  POCT URINE DIPSTICK -  Abnormal; Notable for the following components:      Result Value   Clarity, UA hazy (*)    POC PROTEIN,UA trace (*)    Nitrite, UA Positive (*)    Leukocytes, UA Small (1+) (*)    All other components within normal limits  POCT URINE PREGNANCY - Normal  URINE CULTURE  HCG, QUANTITATIVE, PREGNANCY    EKG   Radiology No results found.  Procedures Procedures (including critical care time)  Medications Ordered in UC Medications - No data to display  Initial Impression / Assessment and Plan / UC Course  I have reviewed the triage vital  signs and the nursing notes.  Pertinent labs & imaging results that were available during my care of the patient were reviewed by me and considered in my medical decision making (see chart for details).    UA findings consistent with UTI.  Will treat with Augmentin  and urine culture ordered.  Urine hCG in office negative however given concerns for upcoming and needed treatment will order hCG quantitative test as well.  Will await results for further recommendation.  Advise follow-up with any concerns.  Final Clinical Impressions(s) / UC Diagnoses   Final diagnoses:  Dysuria  Acute cystitis without hematuria   Discharge Instructions   None    ED Prescriptions     Medication Sig Dispense Auth. Provider   amoxicillin -clavulanate (AUGMENTIN ) 875-125 MG tablet Take 1 tablet by mouth every 12 (twelve) hours. 14 tablet Billy Asberry FALCON, PA-C      PDMP not reviewed this encounter.   Billy Asberry FALCON, PA-C 01/07/24 1901

## 2024-01-07 NOTE — ED Triage Notes (Signed)
 Pt reports possible pregnancy due to sore breasts, nausea, aching low back pain x 1week , and faint possibly positive urine test at home this morning. Pt would like to do one here for us  to take a look at and requests blood test if available. Pt also notes dysuria intermittently x2 weeks. Cranberry juice at home with mild relief of UTI symptoms.

## 2024-01-09 ENCOUNTER — Ambulatory Visit (HOSPITAL_COMMUNITY): Payer: Self-pay

## 2024-01-09 LAB — URINE CULTURE: Culture: >=100000 — AB

## 2024-01-09 MED ORDER — CEPHALEXIN 500 MG PO CAPS: 500.0000 mg | ORAL_CAPSULE | Freq: Two times a day (BID) | ORAL | 0 refills | Status: AC

## 2024-01-09 MED ORDER — FLUCONAZOLE 150 MG PO TABS: 150.0000 mg | ORAL_TABLET | Freq: Once | ORAL | 0 refills | Status: AC

## 2024-01-12 ENCOUNTER — Telehealth: Payer: Self-pay

## 2024-01-12 NOTE — Telephone Encounter (Signed)
 Pt called regarding hCG that has not resulted.  Called [facility]; provided with lab code. States will update and run test. Pt notified via [employer].

## 2024-02-03 LAB — BETA HCG QUANT (REF LAB): hCG Quant: <1 m[IU]/mL

## 2024-03-24 DIAGNOSIS — R002 Palpitations: Secondary | ICD-10-CM | POA: Insufficient documentation

## 2024-03-24 DIAGNOSIS — E6609 Other obesity due to excess calories: Secondary | ICD-10-CM | POA: Insufficient documentation

## 2024-03-24 DIAGNOSIS — K5090 Crohn's disease, unspecified, without complications: Secondary | ICD-10-CM | POA: Insufficient documentation

## 2024-03-24 DIAGNOSIS — R0609 Other forms of dyspnea: Secondary | ICD-10-CM | POA: Insufficient documentation

## 2024-04-21 ENCOUNTER — Ambulatory Visit
Admission: EM | Admit: 2024-04-21 | Discharge: 2024-04-21 | Disposition: A | Discharge status: Home / Self Care | Attending: Family Medicine | Admitting: Family Medicine

## 2024-04-21 ENCOUNTER — Ambulatory Visit (INDEPENDENT_AMBULATORY_CARE_PROVIDER_SITE_OTHER)

## 2024-04-21 DIAGNOSIS — M546 Pain in thoracic spine: Secondary | ICD-10-CM

## 2024-04-21 DIAGNOSIS — R0602 Shortness of breath: Secondary | ICD-10-CM

## 2024-04-21 MED ORDER — CYCLOBENZAPRINE HCL 10 MG PO TABS: ORAL_TABLET | ORAL | 0 refills | Status: AC

## 2024-04-21 MED ORDER — KETOROLAC TROMETHAMINE 30 MG/ML IJ SOLN
60.0000 mg | Freq: Once | INTRAMUSCULAR | Status: AC
  Administered 2024-04-21: 60 mg via INTRAMUSCULAR

## 2024-04-21 NOTE — ED Triage Notes (Signed)
 Patient reports getting out of class then headed to [employer] to p/u water, after picking this case of water up to put it in a buggy experienced immediate sharp pain in left shoulder and back. When putting it in the care after purchase immediately felt the same pain. History of back pain.

## 2024-04-21 NOTE — Discharge Instructions (Signed)
 HOME CARE INSTRUCTIONS: For many people, back pain returns. Since low back pain is rarely dangerous, it is often a condition that people can learn to manage on their own. Please remain active. It is stressful on the back to sit or stand in one place. Do not sit, drive, or stand in one place for more than 30 minutes at a time. Take short walks on level surfaces as soon as pain allows. Try to increase the length of time you walk each day. Do not stay in bed. Resting more than 1 or 2 days can delay your recovery. Do not avoid exercise or work. Your body is made to move. It is not dangerous to be active, even though your back may hurt. Your back will likely heal faster if you return to being active before your pain is gone. Over-the-counter medicines to reduce pain and inflammation are often the most helpful.  SEEK MEDICAL CARE IF: You have pain that is not relieved with rest or medicine. You have pain that does not improve in 1 week. You have new symptoms. You are generally not feeling well.  SEEK IMMEDIATE MEDICAL CARE IF: You have pain that radiates from your back into your legs. You develop new bowel or bladder control problems. You have unusual weakness or numbness in your arms or legs. You develop nausea or vomiting. You develop abdominal pain. You feel faint.   Meds ordered this encounter  Medications   ketorolac  (TORADOL ) 30 MG/ML injection 60 mg   cyclobenzaprine (FLEXERIL) 10 MG tablet    Sig: Take 1 tablet by mouth 3 times daily as needed for muscle spasm. Warning: May cause drowsiness.    Dispense:  21 tablet    Refill:  0

## 2024-04-21 NOTE — ED Provider Notes (Addendum)
 [facility]   [id number] 04/21/24 Arrival Time: 1339  ASSESSMENT & PLAN:  1. Acute left-sided thoracic back pain   2. SOB (shortness of breath)    I have personally viewed and independently interpreted the imaging studies ordered this visit. CXR: no acute changes; no pneumothorax.  Suspect muscle spasm; discussed. Able to ambulate here and hemodynamically stable. School note provided.  Meds ordered this encounter  Medications   ketorolac  (TORADOL ) 30 MG/ML injection 60 mg   cyclobenzaprine (FLEXERIL) 10 MG tablet    Sig: Take 1 tablet by mouth 3 times daily as needed for muscle spasm. Warning: May cause drowsiness.    Dispense:  21 tablet    Refill:  0   Reviewed expectations re: course of current medical issues. Questions answered. Outlined signs and symptoms indicating need for more acute intervention. Patient verbalized understanding. After Visit Summary given.   SUBJECTIVE: History from: patient.  Sharon Clayton is a 33 y.o. female who presents with complaint of L-sided thoracic back pain. Patient reports pain started while picking up palette of bottled water, quick and sharp; now lingering and dull. Does reports mild SOB at this time. Mild CP under L breast. No tx PTA.    OBJECTIVE:  Vitals:   04/21/24 1350 04/21/24 1353  BP:  109/75  Pulse:  73  Resp:  18  Temp:  98.3 F (36.8 C)  TempSrc:  Oral  SpO2:  97%  Weight: 95.3 kg   Height: 5' 2 (1.575 m)     General appearance: alert; no distress HEENT: [place]; AT Neck: supple with FROM; without midline tenderness Abdomen: soft, non-tender; non-distended Resp: CTAB Back: moderate and poorly localized tenderness to palpation over left thoracic paraspinal musculature; FROM at waist; without midline tenderness Extremities: without edema; symmetrical without gross deformities; normal ROM of bilateral LE Skin: warm and dry Neurologic: normal gait; normal sensation and strength of bilateral  LE Psychological: alert and cooperative; normal mood and affect  Labs:  Labs Reviewed - No data to display  Imaging: No results found.  Allergies  Allergen Reactions   Metronidazole  Hives, Itching, Rash and Dermatitis    Pill form  Flagyl   Pill form  Flagyl     Flagyl    Lactose Intolerance (Gi) Diarrhea and Nausea And Vomiting    Past Medical History:  Diagnosis Date   Bacterial infection 09/10/11   Crohn's disease (HCC)    H/O varicella 09/10/11   Hypotension    IBS (irritable bowel syndrome)    Low iron 09/10/11   Trichomonas 09/10/10   Yeast infection 09/10/11   Social History   Socioeconomic History   Marital status: Legally Separated    Spouse name: Not on file   Number of children: 2   Years of education: Not on file   Highest education level: Not on file  Occupational History   Occupation: Homemaker  Tobacco Use   Smoking status: Never   Smokeless tobacco: Never   Tobacco comments:    e-cig  Vaping Use   Vaping status: Every Day   Substances: Nicotine, Flavoring  Substance and Sexual Activity   Alcohol  use: Not Currently    Comment: occ   Drug use: Not Currently    Types: Marijuana    Comment: Years ago.   Sexual activity: Yes    Birth control/protection: None  Other Topics Concern   Not on file  Social History Narrative   Not on file   Social Drivers of Health   Financial Resource Strain:  Low Risk  (03/21/2024)   Received from [facility]   Overall Financial Resource Strain (CARDIA)    How hard is it for you to pay for the very basics like food, housing, medical care, and heating?: Not very hard  Food Insecurity: Patient Declined (03/21/2024)   Received from [facility]   Hunger Vital Sign    Within the past 12 months, you worried that your food would run out before you got the money to buy more.: Patient declined    Within the past 12 months, the food you bought just didn't last and you didn't have money to get more.: Patient declined   Transportation Needs: Patient Declined (03/21/2024)   Received from [facility] - Transportation    In the past 12 months, has lack of transportation kept you from medical appointments or from getting medications?: Patient declined    In the past 12 months, has lack of transportation kept you from meetings, work, or from getting things needed for daily living?: Patient declined  Physical Activity: Inactive (03/21/2024)   Received from [facility]   Exercise Vital Sign    On average, how many days per week do you engage in moderate to strenuous exercise (like a brisk walk)?: 0 days    Minutes of Exercise per Session: Not on file  Stress: Stress Concern Present (03/21/2024)   Received from [facility] - Occupational Stress Questionnaire    Do you feel stress - tense, restless, nervous, or anxious, or unable to sleep at night because your mind is troubled all the time - these days?: To some extent  Social Connections: Socially Integrated (03/21/2024)   Received from [facility]   Social Network    How would you rate your social network (family, work, friends)?: Good participation with social networks  Intimate Partner Violence: Not At Risk (03/21/2024)   Received from [facility]   HITS    Over the last 12 months how often did your partner physically hurt you?: Never    Over the last 12 months how often did your partner insult you or talk down to you?: Never    Over the last 12 months how often did your partner threaten you with physical harm?: Never    Over the last 12 months how often did your partner scream or curse at you?: Never   Family History  Problem Relation Age of Onset   Hypertension Mother    Hypertension Father    Colon cancer Neg Hx    Past Surgical History:  Procedure Laterality Date   CESAREAN SECTION  09/10/11   NRFHT - primary low transverse cesarean section    CESAREAN SECTION N/A 09/04/2013    Procedure: CESAREAN SECTION- repeat;  Surgeon: Gloris DELENA Hugger, MD;  Location: [facility];  Service: Obstetrics;  Laterality: N/A;      Rolinda Rogue, MD 04/21/24 1511    Rolinda Rogue, MD 04/21/24 1511
